# Patient Record
Sex: Female | Born: 1950
Health system: Southern US, Community
[De-identification: ages and names within clinical notes are randomized; demographics above are authoritative.]

## PROBLEM LIST (undated history)

## (undated) DIAGNOSIS — F329 Major depressive disorder, single episode, unspecified: Secondary | ICD-10-CM

## (undated) DIAGNOSIS — E785 Hyperlipidemia, unspecified: Secondary | ICD-10-CM

## (undated) DIAGNOSIS — M1990 Unspecified osteoarthritis, unspecified site: Secondary | ICD-10-CM

## (undated) DIAGNOSIS — E039 Hypothyroidism, unspecified: Secondary | ICD-10-CM

## (undated) DIAGNOSIS — G35 Multiple sclerosis: Secondary | ICD-10-CM

## (undated) DIAGNOSIS — G2581 Restless legs syndrome: Secondary | ICD-10-CM

## (undated) DIAGNOSIS — R011 Cardiac murmur, unspecified: Secondary | ICD-10-CM

## (undated) DIAGNOSIS — K5090 Crohn's disease, unspecified, without complications: Secondary | ICD-10-CM

## (undated) DIAGNOSIS — F32A Depression, unspecified: Secondary | ICD-10-CM

## (undated) DIAGNOSIS — R251 Tremor, unspecified: Secondary | ICD-10-CM

## (undated) DIAGNOSIS — K219 Gastro-esophageal reflux disease without esophagitis: Secondary | ICD-10-CM

## (undated) DIAGNOSIS — H3530 Unspecified macular degeneration: Secondary | ICD-10-CM

## (undated) DIAGNOSIS — R569 Unspecified convulsions: Secondary | ICD-10-CM

## (undated) DIAGNOSIS — G35D Multiple sclerosis, unspecified: Secondary | ICD-10-CM

## (undated) DIAGNOSIS — R519 Headache, unspecified: Secondary | ICD-10-CM

## (undated) HISTORY — DX: Gastro-esophageal reflux disease without esophagitis: K21.9

## (undated) HISTORY — DX: Hyperlipidemia, unspecified: E78.5

## (undated) HISTORY — PX: VAGINAL HYSTERECTOMY: SUR661

## (undated) HISTORY — DX: Restless legs syndrome: G25.81

## (undated) HISTORY — PX: EYE SURGERY: SHX253

## (undated) HISTORY — PX: FOOT SURGERY: SHX648

## (undated) HISTORY — PX: NASAL SINUS SURGERY: SHX719

## (undated) HISTORY — PX: TONSILLECTOMY: SUR1361

## (undated) HISTORY — PX: TUBAL LIGATION: SHX77

## (undated) HISTORY — DX: Unspecified macular degeneration: H35.30

---

## 2004-05-27 ENCOUNTER — Ambulatory Visit: Payer: Self-pay | Admitting: Family Medicine

## 2004-09-27 ENCOUNTER — Ambulatory Visit: Payer: Self-pay | Admitting: Family Medicine

## 2005-04-21 ENCOUNTER — Ambulatory Visit: Payer: Self-pay | Admitting: Family Medicine

## 2005-06-06 ENCOUNTER — Ambulatory Visit: Payer: Self-pay | Admitting: Psychiatry

## 2005-06-13 ENCOUNTER — Ambulatory Visit: Payer: Self-pay | Admitting: Psychiatry

## 2005-11-21 ENCOUNTER — Ambulatory Visit: Payer: Self-pay | Admitting: Family Medicine

## 2006-03-20 ENCOUNTER — Ambulatory Visit: Payer: Self-pay | Admitting: Psychiatry

## 2006-11-27 ENCOUNTER — Ambulatory Visit: Payer: Self-pay | Admitting: Family Medicine

## 2008-02-01 ENCOUNTER — Ambulatory Visit: Payer: Self-pay | Admitting: Gastroenterology

## 2008-07-04 ENCOUNTER — Ambulatory Visit: Payer: Self-pay | Admitting: Family Medicine

## 2009-02-19 ENCOUNTER — Ambulatory Visit: Payer: Self-pay | Admitting: Gastroenterology

## 2009-06-14 ENCOUNTER — Ambulatory Visit: Payer: Self-pay | Admitting: Neurology

## 2009-07-23 ENCOUNTER — Ambulatory Visit: Payer: Self-pay | Admitting: Family Medicine

## 2009-11-28 ENCOUNTER — Encounter: Admission: RE | Admit: 2009-11-28 | Discharge: 2009-11-28 | Payer: Self-pay | Admitting: Diagnostic Neuroimaging

## 2009-11-29 ENCOUNTER — Encounter: Admission: RE | Admit: 2009-11-29 | Discharge: 2010-02-13 | Payer: Self-pay | Admitting: Diagnostic Neuroimaging

## 2011-11-27 ENCOUNTER — Ambulatory Visit: Payer: Self-pay | Admitting: Family Medicine

## 2012-01-27 LAB — HM PAP SMEAR: HM Pap smear: NORMAL

## 2012-01-29 ENCOUNTER — Ambulatory Visit: Payer: Self-pay | Admitting: Family Medicine

## 2012-05-19 HISTORY — PX: COLONOSCOPY: SHX174

## 2013-01-17 LAB — HM COLONOSCOPY: HM COLON: NORMAL

## 2013-01-28 ENCOUNTER — Ambulatory Visit: Payer: Self-pay | Admitting: Gastroenterology

## 2013-05-31 DIAGNOSIS — IMO0002 Reserved for concepts with insufficient information to code with codable children: Secondary | ICD-10-CM | POA: Diagnosis not present

## 2013-06-07 DIAGNOSIS — G2581 Restless legs syndrome: Secondary | ICD-10-CM | POA: Diagnosis not present

## 2013-06-07 DIAGNOSIS — E785 Hyperlipidemia, unspecified: Secondary | ICD-10-CM | POA: Diagnosis not present

## 2013-06-07 DIAGNOSIS — I43 Cardiomyopathy in diseases classified elsewhere: Secondary | ICD-10-CM | POA: Diagnosis not present

## 2013-06-07 DIAGNOSIS — E039 Hypothyroidism, unspecified: Secondary | ICD-10-CM | POA: Diagnosis not present

## 2013-06-29 DIAGNOSIS — H3530 Unspecified macular degeneration: Secondary | ICD-10-CM | POA: Diagnosis not present

## 2013-08-01 DIAGNOSIS — K5190 Ulcerative colitis, unspecified, without complications: Secondary | ICD-10-CM | POA: Diagnosis not present

## 2013-08-01 DIAGNOSIS — Z800 Family history of malignant neoplasm of digestive organs: Secondary | ICD-10-CM | POA: Diagnosis not present

## 2013-08-01 DIAGNOSIS — G35 Multiple sclerosis: Secondary | ICD-10-CM | POA: Diagnosis not present

## 2013-08-25 DIAGNOSIS — IMO0002 Reserved for concepts with insufficient information to code with codable children: Secondary | ICD-10-CM | POA: Diagnosis not present

## 2013-10-03 DIAGNOSIS — R1084 Generalized abdominal pain: Secondary | ICD-10-CM | POA: Diagnosis not present

## 2013-10-03 DIAGNOSIS — Z8719 Personal history of other diseases of the digestive system: Secondary | ICD-10-CM | POA: Diagnosis not present

## 2013-10-05 DIAGNOSIS — R109 Unspecified abdominal pain: Secondary | ICD-10-CM | POA: Diagnosis not present

## 2013-10-05 DIAGNOSIS — E039 Hypothyroidism, unspecified: Secondary | ICD-10-CM | POA: Insufficient documentation

## 2013-10-05 DIAGNOSIS — R197 Diarrhea, unspecified: Secondary | ICD-10-CM | POA: Diagnosis not present

## 2013-10-05 DIAGNOSIS — K52831 Collagenous colitis: Secondary | ICD-10-CM | POA: Insufficient documentation

## 2013-10-05 DIAGNOSIS — K5190 Ulcerative colitis, unspecified, without complications: Secondary | ICD-10-CM | POA: Diagnosis not present

## 2013-10-06 DIAGNOSIS — K5190 Ulcerative colitis, unspecified, without complications: Secondary | ICD-10-CM | POA: Diagnosis not present

## 2013-10-06 DIAGNOSIS — R197 Diarrhea, unspecified: Secondary | ICD-10-CM | POA: Diagnosis not present

## 2013-11-16 DIAGNOSIS — IMO0002 Reserved for concepts with insufficient information to code with codable children: Secondary | ICD-10-CM | POA: Diagnosis not present

## 2013-11-24 DIAGNOSIS — Z0000 Encounter for general adult medical examination without abnormal findings: Secondary | ICD-10-CM | POA: Diagnosis not present

## 2013-11-24 DIAGNOSIS — I43 Cardiomyopathy in diseases classified elsewhere: Secondary | ICD-10-CM | POA: Diagnosis not present

## 2013-11-24 DIAGNOSIS — E785 Hyperlipidemia, unspecified: Secondary | ICD-10-CM | POA: Diagnosis not present

## 2013-11-24 DIAGNOSIS — E039 Hypothyroidism, unspecified: Secondary | ICD-10-CM | POA: Diagnosis not present

## 2013-11-24 LAB — HEPATIC FUNCTION PANEL
ALT: 5 U/L — AB (ref 7–35)
AST: 11 U/L — AB (ref 13–35)

## 2013-12-06 ENCOUNTER — Ambulatory Visit: Payer: Self-pay | Admitting: Family Medicine

## 2013-12-06 DIAGNOSIS — Z1231 Encounter for screening mammogram for malignant neoplasm of breast: Secondary | ICD-10-CM | POA: Diagnosis not present

## 2013-12-06 LAB — HM MAMMOGRAPHY: HM Mammogram: NORMAL

## 2013-12-27 DIAGNOSIS — K5190 Ulcerative colitis, unspecified, without complications: Secondary | ICD-10-CM | POA: Diagnosis not present

## 2013-12-27 DIAGNOSIS — G35 Multiple sclerosis: Secondary | ICD-10-CM | POA: Diagnosis not present

## 2013-12-27 DIAGNOSIS — R198 Other specified symptoms and signs involving the digestive system and abdomen: Secondary | ICD-10-CM | POA: Diagnosis not present

## 2014-01-03 DIAGNOSIS — H353190 Nonexudative age-related macular degeneration, unspecified eye, stage unspecified: Secondary | ICD-10-CM | POA: Diagnosis not present

## 2014-02-07 DIAGNOSIS — IMO0002 Reserved for concepts with insufficient information to code with codable children: Secondary | ICD-10-CM | POA: Diagnosis not present

## 2014-03-28 DIAGNOSIS — K529 Noninfective gastroenteritis and colitis, unspecified: Secondary | ICD-10-CM | POA: Diagnosis not present

## 2014-03-28 DIAGNOSIS — E039 Hypothyroidism, unspecified: Secondary | ICD-10-CM | POA: Diagnosis not present

## 2014-03-28 DIAGNOSIS — K5130 Ulcerative (chronic) rectosigmoiditis without complications: Secondary | ICD-10-CM | POA: Diagnosis not present

## 2014-03-28 DIAGNOSIS — R140 Abdominal distension (gaseous): Secondary | ICD-10-CM | POA: Diagnosis not present

## 2014-04-27 DIAGNOSIS — R2689 Other abnormalities of gait and mobility: Secondary | ICD-10-CM | POA: Diagnosis not present

## 2014-04-27 DIAGNOSIS — M545 Low back pain: Secondary | ICD-10-CM | POA: Diagnosis not present

## 2014-04-27 DIAGNOSIS — R4189 Other symptoms and signs involving cognitive functions and awareness: Secondary | ICD-10-CM | POA: Diagnosis not present

## 2014-05-05 DIAGNOSIS — F330 Major depressive disorder, recurrent, mild: Secondary | ICD-10-CM | POA: Diagnosis not present

## 2014-05-25 DIAGNOSIS — Z23 Encounter for immunization: Secondary | ICD-10-CM | POA: Diagnosis not present

## 2014-05-25 DIAGNOSIS — K219 Gastro-esophageal reflux disease without esophagitis: Secondary | ICD-10-CM | POA: Diagnosis not present

## 2014-05-25 DIAGNOSIS — E784 Other hyperlipidemia: Secondary | ICD-10-CM | POA: Diagnosis not present

## 2014-05-25 DIAGNOSIS — E559 Vitamin D deficiency, unspecified: Secondary | ICD-10-CM | POA: Diagnosis not present

## 2014-05-25 DIAGNOSIS — I43 Cardiomyopathy in diseases classified elsewhere: Secondary | ICD-10-CM | POA: Diagnosis not present

## 2014-05-25 DIAGNOSIS — E039 Hypothyroidism, unspecified: Secondary | ICD-10-CM | POA: Diagnosis not present

## 2014-05-25 DIAGNOSIS — F17200 Nicotine dependence, unspecified, uncomplicated: Secondary | ICD-10-CM | POA: Diagnosis not present

## 2014-05-25 DIAGNOSIS — G2581 Restless legs syndrome: Secondary | ICD-10-CM | POA: Diagnosis not present

## 2014-05-25 LAB — LIPID PANEL
Cholesterol: 189 mg/dL (ref 0–200)
HDL: 44 mg/dL (ref 35–70)
LDL Cholesterol: 122 mg/dL
TRIGLYCERIDES: 116 mg/dL (ref 40–160)

## 2014-05-25 LAB — BASIC METABOLIC PANEL
BUN: 4 mg/dL (ref 4–21)
CREATININE: 0.9 mg/dL (ref <=1.1)
Glucose: 88 mg/dL

## 2014-05-25 LAB — TSH: TSH: 1.95 u[IU]/mL (ref <=5.90)

## 2014-05-31 DIAGNOSIS — K5190 Ulcerative colitis, unspecified, without complications: Secondary | ICD-10-CM | POA: Diagnosis not present

## 2014-06-06 DIAGNOSIS — F330 Major depressive disorder, recurrent, mild: Secondary | ICD-10-CM | POA: Diagnosis not present

## 2014-06-26 DIAGNOSIS — G35 Multiple sclerosis: Secondary | ICD-10-CM | POA: Diagnosis not present

## 2014-06-27 ENCOUNTER — Ambulatory Visit: Payer: Self-pay | Admitting: Family Medicine

## 2014-06-27 DIAGNOSIS — Z780 Asymptomatic menopausal state: Secondary | ICD-10-CM | POA: Diagnosis not present

## 2014-06-27 DIAGNOSIS — M810 Age-related osteoporosis without current pathological fracture: Secondary | ICD-10-CM | POA: Diagnosis not present

## 2014-06-27 LAB — HM DEXA SCAN: HM DEXA SCAN: NORMAL

## 2014-07-10 DIAGNOSIS — G35 Multiple sclerosis: Secondary | ICD-10-CM | POA: Diagnosis not present

## 2014-08-29 DIAGNOSIS — F330 Major depressive disorder, recurrent, mild: Secondary | ICD-10-CM | POA: Diagnosis not present

## 2014-09-29 DIAGNOSIS — F32A Depression, unspecified: Secondary | ICD-10-CM | POA: Insufficient documentation

## 2014-09-29 DIAGNOSIS — G35 Multiple sclerosis: Secondary | ICD-10-CM | POA: Insufficient documentation

## 2014-09-29 DIAGNOSIS — E7849 Other hyperlipidemia: Secondary | ICD-10-CM | POA: Insufficient documentation

## 2014-09-29 DIAGNOSIS — F17200 Nicotine dependence, unspecified, uncomplicated: Secondary | ICD-10-CM | POA: Insufficient documentation

## 2014-09-29 DIAGNOSIS — G35D Multiple sclerosis, unspecified: Secondary | ICD-10-CM | POA: Insufficient documentation

## 2014-09-29 DIAGNOSIS — Z0000 Encounter for general adult medical examination without abnormal findings: Secondary | ICD-10-CM | POA: Insufficient documentation

## 2014-09-29 DIAGNOSIS — G2581 Restless legs syndrome: Secondary | ICD-10-CM | POA: Insufficient documentation

## 2014-09-29 DIAGNOSIS — I519 Heart disease, unspecified: Secondary | ICD-10-CM | POA: Insufficient documentation

## 2014-09-29 DIAGNOSIS — M810 Age-related osteoporosis without current pathological fracture: Secondary | ICD-10-CM | POA: Insufficient documentation

## 2014-09-29 DIAGNOSIS — E039 Hypothyroidism, unspecified: Secondary | ICD-10-CM | POA: Insufficient documentation

## 2014-09-29 DIAGNOSIS — F329 Major depressive disorder, single episode, unspecified: Secondary | ICD-10-CM | POA: Insufficient documentation

## 2014-09-29 DIAGNOSIS — K219 Gastro-esophageal reflux disease without esophagitis: Secondary | ICD-10-CM | POA: Insufficient documentation

## 2014-10-04 DIAGNOSIS — H2513 Age-related nuclear cataract, bilateral: Secondary | ICD-10-CM | POA: Diagnosis not present

## 2014-10-11 DIAGNOSIS — H532 Diplopia: Secondary | ICD-10-CM | POA: Diagnosis not present

## 2014-10-18 ENCOUNTER — Inpatient Hospital Stay: Admission: RE | Admit: 2014-10-18 | Payer: Self-pay | Source: Ambulatory Visit

## 2014-10-18 DIAGNOSIS — H2513 Age-related nuclear cataract, bilateral: Secondary | ICD-10-CM | POA: Diagnosis not present

## 2014-10-24 ENCOUNTER — Encounter: Payer: Self-pay | Admitting: *Deleted

## 2014-10-24 DIAGNOSIS — M810 Age-related osteoporosis without current pathological fracture: Secondary | ICD-10-CM | POA: Diagnosis not present

## 2014-10-24 DIAGNOSIS — G35 Multiple sclerosis: Secondary | ICD-10-CM | POA: Diagnosis not present

## 2014-10-24 DIAGNOSIS — Z885 Allergy status to narcotic agent status: Secondary | ICD-10-CM | POA: Diagnosis not present

## 2014-10-24 DIAGNOSIS — M545 Low back pain: Secondary | ICD-10-CM | POA: Diagnosis not present

## 2014-10-24 DIAGNOSIS — R251 Tremor, unspecified: Secondary | ICD-10-CM | POA: Diagnosis not present

## 2014-10-24 DIAGNOSIS — R05 Cough: Secondary | ICD-10-CM | POA: Diagnosis not present

## 2014-10-24 DIAGNOSIS — R270 Ataxia, unspecified: Secondary | ICD-10-CM | POA: Diagnosis not present

## 2014-10-24 DIAGNOSIS — R4189 Other symptoms and signs involving cognitive functions and awareness: Secondary | ICD-10-CM | POA: Diagnosis not present

## 2014-10-24 DIAGNOSIS — R2689 Other abnormalities of gait and mobility: Secondary | ICD-10-CM | POA: Diagnosis not present

## 2014-10-24 DIAGNOSIS — H2511 Age-related nuclear cataract, right eye: Secondary | ICD-10-CM | POA: Diagnosis not present

## 2014-10-24 DIAGNOSIS — Z90710 Acquired absence of both cervix and uterus: Secondary | ICD-10-CM | POA: Diagnosis not present

## 2014-10-24 DIAGNOSIS — F329 Major depressive disorder, single episode, unspecified: Secondary | ICD-10-CM | POA: Diagnosis not present

## 2014-10-24 DIAGNOSIS — R569 Unspecified convulsions: Secondary | ICD-10-CM | POA: Diagnosis not present

## 2014-10-24 DIAGNOSIS — E079 Disorder of thyroid, unspecified: Secondary | ICD-10-CM | POA: Diagnosis not present

## 2014-10-26 DIAGNOSIS — K5190 Ulcerative colitis, unspecified, without complications: Secondary | ICD-10-CM | POA: Diagnosis not present

## 2014-10-30 ENCOUNTER — Encounter
Admission: RE | Disposition: A | Discharge status: Home / Self Care | Payer: Self-pay | Source: Ambulatory Visit | Attending: Ophthalmology

## 2014-10-30 ENCOUNTER — Encounter: Payer: Self-pay | Admitting: Ophthalmology

## 2014-10-30 ENCOUNTER — Ambulatory Visit: Payer: Medicare Other | Admitting: *Deleted

## 2014-10-30 ENCOUNTER — Ambulatory Visit
Admission: RE | Admit: 2014-10-30 | Discharge: 2014-10-30 | Disposition: A | Discharge status: Home / Self Care | Payer: Medicare Other | Source: Ambulatory Visit | Attending: Ophthalmology | Admitting: Ophthalmology

## 2014-10-30 DIAGNOSIS — Z90710 Acquired absence of both cervix and uterus: Secondary | ICD-10-CM | POA: Insufficient documentation

## 2014-10-30 DIAGNOSIS — R251 Tremor, unspecified: Secondary | ICD-10-CM | POA: Insufficient documentation

## 2014-10-30 DIAGNOSIS — M810 Age-related osteoporosis without current pathological fracture: Secondary | ICD-10-CM | POA: Insufficient documentation

## 2014-10-30 DIAGNOSIS — F329 Major depressive disorder, single episode, unspecified: Secondary | ICD-10-CM | POA: Diagnosis not present

## 2014-10-30 DIAGNOSIS — G35 Multiple sclerosis: Secondary | ICD-10-CM | POA: Diagnosis not present

## 2014-10-30 DIAGNOSIS — Z885 Allergy status to narcotic agent status: Secondary | ICD-10-CM | POA: Insufficient documentation

## 2014-10-30 DIAGNOSIS — H2511 Age-related nuclear cataract, right eye: Secondary | ICD-10-CM | POA: Diagnosis not present

## 2014-10-30 DIAGNOSIS — R05 Cough: Secondary | ICD-10-CM | POA: Insufficient documentation

## 2014-10-30 DIAGNOSIS — H2513 Age-related nuclear cataract, bilateral: Secondary | ICD-10-CM | POA: Diagnosis not present

## 2014-10-30 DIAGNOSIS — E079 Disorder of thyroid, unspecified: Secondary | ICD-10-CM | POA: Insufficient documentation

## 2014-10-30 DIAGNOSIS — E039 Hypothyroidism, unspecified: Secondary | ICD-10-CM | POA: Diagnosis not present

## 2014-10-30 DIAGNOSIS — R569 Unspecified convulsions: Secondary | ICD-10-CM | POA: Insufficient documentation

## 2014-10-30 HISTORY — DX: Unspecified convulsions: R56.9

## 2014-10-30 HISTORY — DX: Hypothyroidism, unspecified: E03.9

## 2014-10-30 HISTORY — DX: Multiple sclerosis: G35

## 2014-10-30 HISTORY — DX: Major depressive disorder, single episode, unspecified: F32.9

## 2014-10-30 HISTORY — DX: Tremor, unspecified: R25.1

## 2014-10-30 HISTORY — DX: Depression, unspecified: F32.A

## 2014-10-30 HISTORY — DX: Cardiac murmur, unspecified: R01.1

## 2014-10-30 HISTORY — PX: CATARACT EXTRACTION W/PHACO: SHX586

## 2014-10-30 HISTORY — DX: Multiple sclerosis, unspecified: G35.D

## 2014-10-30 SURGERY — PHACOEMULSIFICATION, CATARACT, WITH IOL INSERTION
Anesthesia: Monitor Anesthesia Care | Site: Eye | Laterality: Right | Wound class: Clean

## 2014-10-30 MED ORDER — TETRACAINE HCL 0.5 % OP SOLN
OPHTHALMIC | Status: AC
  Filled 2014-10-30: qty 2

## 2014-10-30 MED ORDER — CEFUROXIME OPHTHALMIC INJECTION 1 MG/0.1 ML
INJECTION | OPHTHALMIC | Status: DC | PRN
  Administered 2014-10-30: 0.1 mL via INTRACAMERAL

## 2014-10-30 MED ORDER — PROPARACAINE HCL 0.5 % OP SOLN
OPHTHALMIC | Status: DC | PRN
  Administered 2014-10-30: 1 [drp] via OPHTHALMIC

## 2014-10-30 MED ORDER — EPINEPHRINE HCL 1 MG/ML IJ SOLN
INTRAMUSCULAR | Status: AC
  Filled 2014-10-30: qty 1

## 2014-10-30 MED ORDER — NA CHONDROIT SULF-NA HYALURON 40-17 MG/ML IO SOLN
INTRAOCULAR | Status: DC | PRN
  Administered 2014-10-30: 1 mL via INTRAOCULAR

## 2014-10-30 MED ORDER — EPINEPHRINE HCL 1 MG/ML IJ SOLN
INTRAOCULAR | Status: DC | PRN
  Administered 2014-10-30: 200 mL

## 2014-10-30 MED ORDER — LIDOCAINE HCL (PF) 4 % IJ SOLN
INTRAMUSCULAR | Status: DC | PRN
  Administered 2014-10-30: 5 mL

## 2014-10-30 MED ORDER — TETRACAINE HCL 0.5 % OP SOLN
OPHTHALMIC | Status: DC | PRN
  Administered 2014-10-30: 1 [drp]

## 2014-10-30 MED ORDER — PHENYLEPHRINE HCL 10 % OP SOLN
1.0000 [drp] | OPHTHALMIC | Status: AC | PRN
  Administered 2014-10-30 (×4): 1 [drp] via OPHTHALMIC

## 2014-10-30 MED ORDER — SODIUM CHLORIDE 0.9 % IV SOLN
INTRAVENOUS | Status: DC
  Administered 2014-10-30: 09:00:00 via INTRAVENOUS

## 2014-10-30 MED ORDER — CYCLOPENTOLATE HCL 2 % OP SOLN
1.0000 [drp] | OPHTHALMIC | Status: DC | PRN
  Administered 2014-10-30 (×4): 1 [drp] via OPHTHALMIC

## 2014-10-30 MED ORDER — CYCLOPENTOLATE HCL 2 % OP SOLN
OPHTHALMIC | Status: AC
  Administered 2014-10-30: 1 [drp] via OPHTHALMIC
  Filled 2014-10-30: qty 2

## 2014-10-30 MED ORDER — LIDOCAINE HCL (PF) 4 % IJ SOLN
INTRAMUSCULAR | Status: AC
  Filled 2014-10-30: qty 5

## 2014-10-30 MED ORDER — ALFENTANIL 500 MCG/ML IJ INJ
INJECTION | INTRAMUSCULAR | Status: DC | PRN
  Administered 2014-10-30: 500 ug via INTRAVENOUS

## 2014-10-30 MED ORDER — NA CHONDROIT SULF-NA HYALURON 40-17 MG/ML IO SOLN
INTRAOCULAR | Status: AC
  Filled 2014-10-30: qty 1

## 2014-10-30 MED ORDER — MOXIFLOXACIN HCL 0.5 % OP SOLN
OPHTHALMIC | Status: AC
  Administered 2014-10-30: 1 [drp] via OPHTHALMIC
  Filled 2014-10-30: qty 3

## 2014-10-30 MED ORDER — PROPARACAINE HCL 0.5 % OP SOLN
OPHTHALMIC | Status: AC
  Filled 2014-10-30: qty 15

## 2014-10-30 MED ORDER — LIDOCAINE HCL (PF) 1 % IJ SOLN
INTRAOCULAR | Status: DC | PRN
  Administered 2014-10-30: .5 mL

## 2014-10-30 MED ORDER — MOXIFLOXACIN HCL 0.5 % OP SOLN
1.0000 [drp] | OPHTHALMIC | Status: AC | PRN
  Administered 2014-10-30 (×3): 1 [drp] via OPHTHALMIC

## 2014-10-30 MED ORDER — MIDAZOLAM HCL 2 MG/2ML IJ SOLN
INTRAMUSCULAR | Status: DC | PRN
  Administered 2014-10-30: 1 mg via INTRAVENOUS

## 2014-10-30 MED ORDER — BUPIVACAINE HCL (PF) 0.75 % IJ SOLN
INTRAMUSCULAR | Status: AC
  Filled 2014-10-30: qty 10

## 2014-10-30 MED ORDER — PHENYLEPHRINE HCL 10 % OP SOLN
OPHTHALMIC | Status: AC
  Administered 2014-10-30: 1 [drp] via OPHTHALMIC
  Filled 2014-10-30: qty 5

## 2014-10-30 MED ORDER — CARBACHOL 0.01 % IO SOLN
INTRAOCULAR | Status: DC | PRN
  Administered 2014-10-30: 0.5 mL via INTRAOCULAR

## 2014-10-30 MED ORDER — MOXIFLOXACIN HCL 0.5 % OP SOLN - NO CHARGE
OPHTHALMIC | Status: DC | PRN
  Administered 2014-10-30: 1 [drp]

## 2014-10-30 MED ORDER — HYALURONIDASE HUMAN 150 UNIT/ML IJ SOLN
INTRAMUSCULAR | Status: AC
  Filled 2014-10-30: qty 1

## 2014-10-30 SURGICAL SUPPLY — 28 items
ACTIVE FMS ×3 IMPLANT
CORD BIP STRL DISP 12FT (MISCELLANEOUS) ×3 IMPLANT
DRAPE XRAY CASSETTE 23X24 (DRAPES) ×3 IMPLANT
ERASER HMR WETFIELD 18G (MISCELLANEOUS) ×3 IMPLANT
GLOVE BIO SURGEON STRL SZ8 (GLOVE) ×3 IMPLANT
GLOVE SURG LX 6.5 MICRO (GLOVE) ×2
GLOVE SURG LX 8.0 MICRO (GLOVE) ×2
GLOVE SURG LX STRL 6.5 MICRO (GLOVE) ×1 IMPLANT
GLOVE SURG LX STRL 8.0 MICRO (GLOVE) ×1 IMPLANT
GOWN STRL REUS W/ TWL LRG LVL3 (GOWN DISPOSABLE) ×1 IMPLANT
GOWN STRL REUS W/ TWL XL LVL3 (GOWN DISPOSABLE) ×1 IMPLANT
GOWN STRL REUS W/TWL LRG LVL3 (GOWN DISPOSABLE) ×2
GOWN STRL REUS W/TWL XL LVL3 (GOWN DISPOSABLE) ×2
LENS IOL ACRSF IQ TRC 4 22.5 (Intraocular Lens) ×1 IMPLANT
LENS IOL ACRYSOF IQ TORIC 22.5 (Intraocular Lens) ×2 IMPLANT
LENS IOL IQ TORIC 4 22.5 (Intraocular Lens) ×1 IMPLANT
PACK CATARACT (MISCELLANEOUS) ×3 IMPLANT
PACK CATARACT DINGLEDEIN LX (MISCELLANEOUS) ×3 IMPLANT
PACK EYE AFTER SURG (MISCELLANEOUS) ×3 IMPLANT
SHLD EYE VISITEC  UNIV (MISCELLANEOUS) ×3 IMPLANT
SOL PREP PVP 2OZ (MISCELLANEOUS) ×3
SOLUTION PREP PVP 2OZ (MISCELLANEOUS) ×1 IMPLANT
SUT SILK 5-0 (SUTURE) ×3 IMPLANT
SYR 5ML LL (SYRINGE) ×3 IMPLANT
SYR TB 1ML 27GX1/2 LL (SYRINGE) ×3 IMPLANT
WATER STERILE IRR 1000ML POUR (IV SOLUTION) ×3 IMPLANT
WIPE NON LINTING 3.25X3.25 (MISCELLANEOUS) ×3 IMPLANT
acrysof toric lens ×3 IMPLANT

## 2014-10-30 NOTE — Transfer of Care (Signed)
Immediate Anesthesia Transfer of Care Note  Patient: Brooke Juarez  Procedure(s) Performed: Procedure(s) with comments: CATARACT EXTRACTION PHACO AND INTRAOCULAR LENS PLACEMENT (IOC) (Right) - [place]  1:55.7 AP%   25.1 CDE  45.18  Patient Location: PACU  Anesthesia Type:MAC  Level of Consciousness: awake, alert  and oriented  Airway & Oxygen Therapy: Patient Spontanous Breathing  Post-op Assessment: Report given to RN and Post -op Vital signs reviewed and stable  Post vital signs: Reviewed and stable  Last Vitals:  Filed Vitals:   10/30/14 1019  BP: 133/75  Pulse:   Temp: 36.3 C  Resp: 16    Complications: No apparent anesthesia complications

## 2014-10-30 NOTE — Op Note (Signed)
Date of Surgery: 10/30/2014 Date of Dictation: 10/30/2014 10:15 AM Pre-operative Diagnosis:Nuclear Sclerotic Cataract right Eye Post-operative Diagnosis: same Procedure performed: Extra-capsular Cataract Extraction (ECCE) with placement of a posterior chamber intraocular lens (IOL) right Eye IOL:  Implant Name Type Inv. Item Serial No. Manufacturer Lot No. LRB No. Used  acrysof toric lens     [id number]     Right 1   AcrysofToric T4 Anesthesia: 2% Lidocaine and 4% Marcaine in a 50/50 mixture with 10 unites/ml of Hylenex given as a peribulbar Anesthesiologist: Anesthesiologist: Elyse Hsu, MD CRNA: Bernardo Heater, CRNA Complications: none Estimated Blood Loss: less than 1 ml  Description of procedure:  The patient sat upright and the eyes were anesthetized with topical proparacaine. With the patient fixing at a distant target the 3 o'clock and 9 o'clock positions at the limbus were marked with an sterile indelible surgical marking pen.  The patient was given anesthesia and sedation via intravenous access. The patient was then prepped and draped in the usual fashion. A 25-gauge needle was bent to use for starting the capsulorhexis. A 5-0 silk suture was placed through the conjunctiva superior and inferiorly to serve as bridle sutures.   The [place] system was engaged and registration was performed. The positions of the incisions and the steep axis of the astigmatism were identified by the [place] system and marked with an indelible pen. The Verion heads up display was turned off.  Hemostasis was obtained at the the position of the main incision using an eraser cautery. A partial thickness groove was made at the at that location with a 64 Beaver blade and this was dissected anteriorly with an Avaya. The anterior chamber was entered at 10 o'clock with a 1.0 mm paracentesis knife and through the lamellar dissection with a 2.6 mm Alcon keratome. DiscoVisc was injected to replace the  aqueous and a continuous tear curvilinear capsulorhexis was performed using a bent 25-gauge needle.  Balance salt on a syringe was used to perform hydro-dissection and phacoemulsification was carried out using a divide and conquer technique. Total ultrasound time was 1:55.7. The average ultrasonic power was 25.1. The CDE was 45.18.  Irrigation/aspiration was used to remove the residual cortex and the capsular bag was inflated with DiscoVisc. The intraocular lens was inserted into the capsular bag using a Monarch Delivery System cartridge. The Verion heads up display was turned on and the lens was rotated so that the marks on the base of the haptics were aligned with the steep axis of astigmatism as identified by the [employer] unit.   Irrigation/aspiration was used to remove the residual DiscoVisc. The I/A hand piece was pressed down on top of the lens to prevent rotation. The wound was inflated with balanced salt and checked for leaks. None were found. The position of the Toric lens was reconfirmed using the [place] unit. Miostat was injected via the paracentesis track and 0.1 ml of cefuroxime containing 1 mg of drug was injected via the paracentesis track. The wound was checked for leaks again and none were found.   The bridal sutures were removed and two drops of Vigamox were placed on the eye. An eye shield was placed to protect the eye and the patient was discharged to the recovery area in good condition.   Brooke Schaper MD @T @

## 2014-10-30 NOTE — Op Note (Signed)
Date of Surgery: 10/30/2014 Date of Dictation: 10/30/2014 10:18 AM Pre-operative Diagnosis:Nuclear Sclerotic Cataract right Eye Post-operative Diagnosis: same Procedure performed: Extra-capsular Cataract Extraction (ECCE) with placement of a posterior chamber intraocular lens (IOL) right Eye IOL:  Implant Name Type Inv. Item Serial No. Manufacturer Lot No. LRB No. Used  acrysof toric lens     [id number]     Right 1   Anesthesia: 2% Lidocaine and 4% Marcaine in a 50/50 mixture with 10 unites/ml of Hylenex given as a peribulbar Anesthesiologist: Anesthesiologist: Elyse Hsu, MD CRNA: Bernardo Heater, CRNA Complications: none Estimated Blood Loss: less than 1 ml  Description of procedure:  The patient sat upright and the eyes were anesthetized with topical proparacaine. With the patient fixing at a distant target the 3 o'clock and 9 o'clock positions at the limbus were marked with an sterile indelible surgical marking pen.  The patient was given anesthesia and sedation via intravenous access. The patient was then prepped and draped in the usual fashion. A 25-gauge needle was bent to use for starting the capsulorhexis. A 5-0 silk suture was placed through the conjunctiva superior and inferiorly to serve as bridle sutures.   The [place] system was engaged and registration was performed. The positions of the incisions and the steep axis of the astigmatism were identified by the [place] system and marked with an indelible pen. The Verion heads up display was turned off.  Hemostasis was obtained at the the position of the main incision using an eraser cautery. A partial thickness groove was made at the at that location with a 64 Beaver blade and this was dissected anteriorly with an Avaya. The anterior chamber was entered at 10 o'clock with a 1.0 mm paracentesis knife and through the lamellar dissection with a 2.6 mm Alcon keratome. DiscoVisc was injected to replace the aqueous and a  continuous tear curvilinear capsulorhexis was performed using a bent 25-gauge needle.  Balance salt on a syringe was used to perform hydro-dissection and phacoemulsification was carried out using a divide and conquer technique. Total ultrasound time was 155.7. The average ultrasonic power was 25.1. The CDE was 45.18.  Irrigation/aspiration was used to remove the residual cortex and the capsular bag was inflated with DiscoVisc. The intraocular lens was inserted into the capsular bag using a Monarch Delivery System cartridge. The Verion heads up display was turned on and the lens was rotated so that the marks on the base of the haptics were aligned with the steep axis of astigmatism as identified by the [place] unit.   Irrigation/aspiration was used to remove the residual DiscoVisc. The I/A hand piece was pressed down on top of the lens to prevent rotation. The wound was inflated with balanced salt and checked for leaks. None were found. The position of the Toric lens was reconfirmed using the [place] unit. Miostat was injected via the paracentesis track and 0.1 ml of cefuroxime containing 1 mg of drug was injected via the paracentesis track. The wound was checked for leaks again and none were found.   The bridal sutures were removed and two drops of Vigamox were placed on the eye. An eye shield was placed to protect the eye and the patient was discharged to the recovery area in good condition.   Ayad Nieman MD @T @

## 2014-10-30 NOTE — Anesthesia Postprocedure Evaluation (Signed)
  Anesthesia Post-op Note  Patient: Brooke Juarez  Procedure(s) Performed: Procedure(s) with comments: CATARACT EXTRACTION PHACO AND INTRAOCULAR LENS PLACEMENT (IOC) (Right) - [place]  1:55.7 AP%   25.1 CDE  45.18  Anesthesia type:MAC  Patient location: PACU  Post pain: Pain level controlled  Post assessment: Post-op Vital signs reviewed, Patient's Cardiovascular Status Stable, Respiratory Function Stable, Patent Airway and No signs of Nausea or vomiting  Post vital signs: Reviewed and stable  Last Vitals:  Filed Vitals:   10/30/14 1019  BP: 133/75  Pulse:   Temp: 36.3 C  Resp: 16    Level of consciousness: awake, alert  and patient cooperative  Complications: No apparent anesthesia complications

## 2014-10-30 NOTE — Anesthesia Preprocedure Evaluation (Signed)
Anesthesia Evaluation  Patient identified by MRN, date of birth, ID band Patient awake    Reviewed: Allergy & Precautions, NPO status , Patient's Chart, lab work & pertinent test results  Airway Mallampati: I  TM Distance: >3 FB Neck ROM: Full    Dental  (+) Partial Upper One tooth is a partial and has been removed--upper.:   Pulmonary Current Smoker,    Pulmonary exam normal       Cardiovascular Exercise Tolerance: Poor Normal cardiovascular exam    Neuro/Psych Long hx of MS--ataxia, poor muscle tone and difficulty walking.    GI/Hepatic GERD-  Medicated and Controlled,  Endo/Other  Hypothyroidism Treated hypothyroidism, stable.  Renal/GU      Musculoskeletal   Abdominal Normal abdominal exam  (+)   Peds  Hematology   Anesthesia Other Findings   Reproductive/Obstetrics                             Anesthesia Physical Anesthesia Plan  ASA: III  Anesthesia Plan: MAC   Post-op Pain Management:    Induction:   Airway Management Planned: Nasal Cannula  Additional Equipment:   Intra-op Plan:   Post-operative Plan:   Informed Consent:   Consent reviewed with POA  Plan Discussed with: CRNA  Anesthesia Plan Comments:         Anesthesia Quick Evaluation

## 2014-10-30 NOTE — H&P (Signed)
  History and physical was faxed and scanned in.

## 2014-10-30 NOTE — Anesthesia Postprocedure Evaluation (Signed)
  Anesthesia Post-op Note  Patient: Brooke Juarez  Procedure(s) Performed: Procedure(s) with comments: CATARACT EXTRACTION PHACO AND INTRAOCULAR LENS PLACEMENT (IOC) (Right) - [place]  1:55.7 AP%   25.1 CDE  45.18  Anesthesia type:MAC  Patient location: PACU  Post pain: Pain level controlled  Post assessment: Post-op Vital signs reviewed, Patient's Cardiovascular Status Stable, Respiratory Function Stable, Patent Airway and No signs of Nausea or vomiting  Post vital signs: Reviewed and stable  Last Vitals:  Filed Vitals:   10/30/14 1027  BP: 129/70  Pulse: 64  Temp:   Resp: 6    Level of consciousness: awake, alert  and patient cooperative  Complications: No apparent anesthesia complications

## 2014-10-30 NOTE — Discharge Instructions (Addendum)
See handoutAMBULATORY SURGERY  DISCHARGE INSTRUCTIONS   1) The drugs that you were given will stay in your system until tomorrow so for the next 24 hours you should not:  A) Drive an automobile B) Make any legal decisions C) Drink any alcoholic beverage   2) You may resume regular meals tomorrow.  Today it is better to start with liquids and gradually work up to solid foods.  You may eat anything you prefer, but it is better to start with liquids, then soup and crackers, and gradually work up to solid foods.   3) Please notify your doctor immediately if you have any unusual bleeding, trouble breathing, redness and pain at the surgery site, drainage, fever, or pain not relieved by medication. 4)   5) Your post-operative visit with Dr.    George Ina                                 is: Date:                        Time:    Please call to schedule your post-operative visit.  6) Additional Instructions:

## 2014-10-30 NOTE — Interval H&P Note (Signed)
History and Physical Interval Note:  10/30/2014 9:29 AM  Brooke Juarez  has presented today for surgery, with the diagnosis of CATARACT  The various methods of treatment have been discussed with the patient and family. After consideration of risks, benefits and other options for treatment, the patient has consented to  Procedure(s): CATARACT EXTRACTION PHACO AND INTRAOCULAR LENS PLACEMENT ([place]) (Right) as a surgical intervention .  The patient's history has been reviewed, patient examined, no change in status, stable for surgery.  I have reviewed the patient's chart and labs.  Questions were answered to the patient's satisfaction.     [name]

## 2014-11-02 ENCOUNTER — Encounter: Payer: Self-pay | Admitting: Ophthalmology

## 2014-11-09 DIAGNOSIS — K5100 Ulcerative (chronic) pancolitis without complications: Secondary | ICD-10-CM | POA: Diagnosis not present

## 2014-11-15 ENCOUNTER — Other Ambulatory Visit: Payer: Self-pay | Admitting: Family Medicine

## 2014-11-15 DIAGNOSIS — E039 Hypothyroidism, unspecified: Secondary | ICD-10-CM

## 2014-11-22 ENCOUNTER — Encounter: Payer: Self-pay | Admitting: Family Medicine

## 2014-11-22 ENCOUNTER — Other Ambulatory Visit: Payer: Self-pay | Admitting: Family Medicine

## 2014-11-22 DIAGNOSIS — F330 Major depressive disorder, recurrent, mild: Secondary | ICD-10-CM | POA: Diagnosis not present

## 2014-11-22 DIAGNOSIS — E559 Vitamin D deficiency, unspecified: Secondary | ICD-10-CM

## 2014-11-22 MED ORDER — VITAMIN D 50 MCG (2000 UT) PO CAPS: 1.0000 | ORAL_CAPSULE | Freq: Every day | ORAL | Status: DC

## 2014-11-27 ENCOUNTER — Encounter: Payer: Self-pay | Admitting: Family Medicine

## 2014-11-27 ENCOUNTER — Other Ambulatory Visit: Payer: Self-pay | Admitting: Family Medicine

## 2014-11-27 ENCOUNTER — Ambulatory Visit (INDEPENDENT_AMBULATORY_CARE_PROVIDER_SITE_OTHER): Payer: Medicare Other | Admitting: Family Medicine

## 2014-11-27 VITALS — BP 98/60 | HR 60 | Ht 62.0 in | Wt 139.0 lb

## 2014-11-27 DIAGNOSIS — F329 Major depressive disorder, single episode, unspecified: Secondary | ICD-10-CM | POA: Diagnosis not present

## 2014-11-27 DIAGNOSIS — Z0000 Encounter for general adult medical examination without abnormal findings: Secondary | ICD-10-CM | POA: Diagnosis not present

## 2014-11-27 DIAGNOSIS — E039 Hypothyroidism, unspecified: Secondary | ICD-10-CM

## 2014-11-27 DIAGNOSIS — K219 Gastro-esophageal reflux disease without esophagitis: Secondary | ICD-10-CM | POA: Diagnosis not present

## 2014-11-27 DIAGNOSIS — I2510 Atherosclerotic heart disease of native coronary artery without angina pectoris: Secondary | ICD-10-CM | POA: Insufficient documentation

## 2014-11-27 DIAGNOSIS — E784 Other hyperlipidemia: Secondary | ICD-10-CM | POA: Diagnosis not present

## 2014-11-27 DIAGNOSIS — Z1231 Encounter for screening mammogram for malignant neoplasm of breast: Secondary | ICD-10-CM

## 2014-11-27 DIAGNOSIS — Z1239 Encounter for other screening for malignant neoplasm of breast: Secondary | ICD-10-CM

## 2014-11-27 DIAGNOSIS — F17210 Nicotine dependence, cigarettes, uncomplicated: Secondary | ICD-10-CM | POA: Diagnosis not present

## 2014-11-27 DIAGNOSIS — M810 Age-related osteoporosis without current pathological fracture: Secondary | ICD-10-CM | POA: Diagnosis not present

## 2014-11-27 DIAGNOSIS — F32A Depression, unspecified: Secondary | ICD-10-CM

## 2014-11-27 DIAGNOSIS — E7849 Other hyperlipidemia: Secondary | ICD-10-CM

## 2014-11-27 NOTE — Progress Notes (Signed)
Patient: Brooke Juarez, Female    DOB: Oct 10, 1950, 64 y.o.   MRN: [id number] Visit Date: 11/27/2014  Today's Provider: Otilio Miu, MD   Chief Complaint  Patient presents with  . Annual Exam    mammo due after 12/07/14    Subjective:    Annual wellness visit Brooke Juarez is a 64 y.o. female who presents today for her Subsequent Annual Wellness Visit. She feels well. She reports exercising rarely due to ms condition. She reports she is sleeping well.   ----------------------------------------------------------- HPI  Review of Systems  Constitutional: Negative for fever, chills, fatigue and unexpected weight change.  HENT: Negative for ear pain, hearing loss, nosebleeds, sneezing, sore throat and trouble swallowing.   Eyes: Negative for photophobia, pain, redness, itching and visual disturbance.  Respiratory: Negative for cough, chest tightness, shortness of breath and wheezing.   Cardiovascular: Negative for chest pain, palpitations and leg swelling.  Gastrointestinal: Negative for nausea, vomiting, abdominal pain, diarrhea, constipation, blood in stool and rectal pain.  Endocrine: Negative for cold intolerance, heat intolerance, polydipsia, polyphagia and polyuria.  Genitourinary: Negative for dysuria, hematuria, flank pain, vaginal bleeding, vaginal discharge, difficulty urinating, menstrual problem and pelvic pain.  Musculoskeletal: Negative for back pain, joint swelling, neck pain and neck stiffness.  Skin: Negative for color change and rash.  Allergic/Immunologic: Negative for immunocompromised state.  Neurological: Negative for dizziness, tremors, seizures, syncope, facial asymmetry, speech difficulty, weakness, light-headedness, numbness and headaches.  Hematological: Does not bruise/bleed easily.  Psychiatric/Behavioral: Negative for suicidal ideas, hallucinations, behavioral problems, confusion, sleep disturbance, self-injury and agitation. The patient is not nervous/anxious.      History   Social History  . Marital Status: Married    Spouse Name: N/A  . Number of Children: N/A  . Years of Education: N/A   Occupational History  . Not on file.   Social History Main Topics  . Smoking status: Current Every Day Smoker  . Smokeless tobacco: Not on file  . Alcohol Use: No  . Drug Use: No  . Sexual Activity: Not Currently   Other Topics Concern  . Not on file   Social History Narrative    Patient Active Problem List   Diagnosis Date Noted  . CAD (coronary artery disease) 11/27/2014  . Medicare annual wellness visit, subsequent 11/27/2014  . Vitamin D deficiency 11/22/2014  . Familial multiple lipoprotein-type hyperlipidemia 09/29/2014  . Clinical depression 09/29/2014  . Acid reflux 09/29/2014  . Routine general medical examination at a health care facility 09/29/2014  . Restless leg 09/29/2014  . DS (disseminated sclerosis) 09/29/2014  . Nicotine addiction 09/29/2014  . OP (osteoporosis) 09/29/2014  . Adult hypothyroidism 10/05/2013  . Colitis gravis 10/05/2013    Past Surgical History  Procedure Laterality Date  . Vaginal hysterectomy    . Nasal sinus surgery    . Tonsillectomy    . Foot surgery    . Tubal ligation    . Cataract extraction w/phaco Right 10/30/2014    Procedure: CATARACT EXTRACTION PHACO AND INTRAOCULAR LENS PLACEMENT (IOC);  Surgeon: Estill Cotta, MD;  Location: [facility] ORS;  Service: Ophthalmology;  Laterality: Right;  [place]  1:55.7   . Colonoscopy  2014    normal- Rein- repeat in 5 years    Her family history includes Cancer in her mother.    Previous Medications   ALENDRONATE (FOSAMAX) 70 MG TABLET    Take 1 tablet by mouth once a week.   ASPIRIN 81 MG TABLET    Take 1 tablet  by mouth daily.   BUPROPION (WELLBUTRIN SR) 150 MG 12 HR TABLET    Take 1 tablet by mouth 2 (two) times daily.   CHOLECALCIFEROL (VITAMIN D) 2000 UNITS CAPS    Take 1 capsule (2,000 Units total) by mouth daily.   EZETIMIBE (ZETIA) 10 MG  TABLET    Take 1 tablet by mouth daily.   GABAPENTIN (NEURONTIN) 600 MG TABLET    Take 2 tablets by mouth 2 (two) times daily. Dr [name]   LEVOTHYROXINE (SYNTHROID, LEVOTHROID) 100 MCG TABLET    TAKE ONE TABLET BY MOUTH ONCE DAILY   MESALAMINE (LIALDA) 1.2 G EC TABLET    Take 1 tablet by mouth 2 (two) times daily. GI   PANTOPRAZOLE (PROTONIX) 40 MG TABLET    Take 1 tablet by mouth daily.   ROPINIROLE (REQUIP) 1 MG TABLET    Take 1 tablet by mouth daily.   SULFASALAZINE (AZULFIDINE) 500 MG EC TABLET    Take 1 tablet by mouth 2 (two) times daily.   TRAZODONE (DESYREL) 100 MG TABLET    Take 1 tablet by mouth daily.    Patient Care Team: Juline Patch, MD as PCP - General (Family Medicine)     Objective:   Vitals: BP 98/60 mmHg  Pulse 60  Ht 5' 2"  (1.575 m)  Wt 139 lb (63.05 kg)  BMI 25.42 kg/m2  LMP  (LMP Unknown)  Physical Exam  Constitutional: She is oriented to person, place, and time. She appears well-developed and well-nourished.  HENT:  Head: Normocephalic.  Right Ear: External ear normal.  Left Ear: External ear normal.  Nose: Nose normal.  Eyes: Conjunctivae and EOM are normal. Pupils are equal, round, and reactive to light.  Neck: Normal range of motion. Neck supple.  Cardiovascular: Normal rate, regular rhythm, normal heart sounds and intact distal pulses.   Pulmonary/Chest: Effort normal and breath sounds normal. Right breast exhibits no inverted nipple, no mass, no nipple discharge, no skin change and no tenderness. Left breast exhibits no inverted nipple, no mass, no nipple discharge, no skin change and no tenderness. Breasts are symmetrical.  Abdominal: Soft. Bowel sounds are normal.  Genitourinary: Vagina normal and uterus normal.  Musculoskeletal: Normal range of motion.  Neurological: She is alert and oriented to person, place, and time. She has normal reflexes.  Skin: Skin is warm and dry.  Psychiatric: She has a normal mood and affect. Her behavior is normal.  Judgment and thought content normal.  Nursing note and vitals reviewed.   Activities of Daily Living No flowsheet data found.  Fall Risk Assessment Fall Risk  11/27/2014  Falls in the past year? Yes  Number falls in past yr: 1  Injury with Fall? No  Risk for fall due to : Impaired balance/gait  Follow up Falls prevention discussed     Patient reports there are not safety devices in place in shower at home.   Depression Screen PHQ 2/9 Scores 11/27/2014  PHQ - 2 Score 0    Cognitive Testing - 6-CIT   Correct? Score   What year is it? yes 0 Yes = 0    No = 4  What month is it? yes 0 Yes = 0    No = 3  Remember:     Pia [address]au, [place], [place]     What time is it? yes 0 Yes = 0    No = 3  Count backwards from 20 to 1 yes 0 Correct =  0    1 error = 2   More than 1 error = 4  Say the months of the year in reverse. yes 0 Correct = 0    1 error = 2   More than 1 error = 4  What address did I ask you to remember? yes 0 Correct = 0  1 error = 2    2 error = 4    3 error = 6    4 error = 8    All wrong = 10       TOTAL SCORE  0/28   Interpretation:  Normal  Normal (0-7) Abnormal (8-28)        Assessment & Plan:     Annual Wellness Visit  Reviewed patient's Family Medical History Reviewed and updated list of patient's medical providers Assessment of cognitive impairment was done Assessed patient's functional ability Established a written schedule for health screening [place] Completed and Reviewed  Exercise Activities and Dietary recommendations Goals    None      Immunization History  Administered Date(s) Administered  . Pneumococcal Conjugate-13 05/25/2014  . Pneumococcal Polysaccharide-23 05/20/2007  . Tdap 05/20/2007    Health Maintenance  Topic Date Due  . ZOSTAVAX  06/02/2008  . INFLUENZA VACCINE  12/18/2014  . PNA vac Low Risk Adult (2 of 2 - PPSV23) 05/26/2015  . MAMMOGRAM  12/07/2015  . TETANUS/TDAP  05/19/2017  .  COLONOSCOPY  01/18/2023  . DEXA SCAN  Completed     Discussed health benefits of physical activity, and encouraged her to engage in regular exercise appropriate for her age and condition.    ------------------------------------------------------------------------------------------------------------   Problem List Items Addressed This Visit      Cardiovascular and Mediastinum   CAD (coronary artery disease) - Primary     Digestive   Acid reflux     Endocrine   Adult hypothyroidism     Musculoskeletal and Integument   OP (osteoporosis)     Other   Familial multiple lipoprotein-type hyperlipidemia   Relevant Orders   Lipid Profile   Clinical depression   Nicotine addiction   Medicare annual wellness visit, subsequent   Relevant Orders   Lipid Profile       Otilio Miu, MD [facility]  11/27/2014

## 2014-11-28 LAB — LIPID PANEL
Chol/HDL Ratio: 3.1 ratio units (ref 0.0–4.4)
Cholesterol, Total: 169 mg/dL (ref 100–199)
HDL: 55 mg/dL (ref >39)
LDL Calculated: 98 mg/dL (ref 0–99)
Triglycerides: 79 mg/dL (ref 0–149)
VLDL Cholesterol Cal: 16 mg/dL (ref 5–40)

## 2014-12-12 ENCOUNTER — Ambulatory Visit
Admission: RE | Admit: 2014-12-12 | Discharge: 2014-12-12 | Disposition: A | Discharge status: Home / Self Care | Payer: Medicare Other | Source: Ambulatory Visit | Attending: Family Medicine | Admitting: Family Medicine

## 2014-12-12 DIAGNOSIS — Z1231 Encounter for screening mammogram for malignant neoplasm of breast: Secondary | ICD-10-CM | POA: Insufficient documentation

## 2014-12-14 ENCOUNTER — Other Ambulatory Visit: Payer: Self-pay | Admitting: Family Medicine

## 2014-12-14 DIAGNOSIS — E039 Hypothyroidism, unspecified: Secondary | ICD-10-CM

## 2014-12-14 DIAGNOSIS — M810 Age-related osteoporosis without current pathological fracture: Secondary | ICD-10-CM

## 2015-03-13 DIAGNOSIS — F330 Major depressive disorder, recurrent, mild: Secondary | ICD-10-CM | POA: Diagnosis not present

## 2015-03-19 ENCOUNTER — Other Ambulatory Visit: Payer: Self-pay | Admitting: Family Medicine

## 2015-04-18 ENCOUNTER — Other Ambulatory Visit: Payer: Self-pay | Admitting: Family Medicine

## 2015-04-23 DIAGNOSIS — K52831 Collagenous colitis: Secondary | ICD-10-CM | POA: Diagnosis not present

## 2015-05-21 ENCOUNTER — Other Ambulatory Visit: Payer: Self-pay | Admitting: Family Medicine

## 2015-05-31 ENCOUNTER — Ambulatory Visit (INDEPENDENT_AMBULATORY_CARE_PROVIDER_SITE_OTHER): Payer: Medicare Other | Admitting: Family Medicine

## 2015-05-31 ENCOUNTER — Encounter: Payer: Self-pay | Admitting: Family Medicine

## 2015-05-31 VITALS — BP 120/80 | HR 78 | Ht 62.0 in | Wt 141.0 lb

## 2015-05-31 DIAGNOSIS — S338XXA Sprain of other parts of lumbar spine and pelvis, initial encounter: Secondary | ICD-10-CM

## 2015-05-31 DIAGNOSIS — M810 Age-related osteoporosis without current pathological fracture: Secondary | ICD-10-CM

## 2015-05-31 DIAGNOSIS — G2581 Restless legs syndrome: Secondary | ICD-10-CM | POA: Diagnosis not present

## 2015-05-31 DIAGNOSIS — E7849 Other hyperlipidemia: Secondary | ICD-10-CM

## 2015-05-31 DIAGNOSIS — E784 Other hyperlipidemia: Secondary | ICD-10-CM

## 2015-05-31 DIAGNOSIS — Z0000 Encounter for general adult medical examination without abnormal findings: Secondary | ICD-10-CM | POA: Diagnosis not present

## 2015-05-31 DIAGNOSIS — E039 Hypothyroidism, unspecified: Secondary | ICD-10-CM | POA: Diagnosis not present

## 2015-05-31 DIAGNOSIS — K219 Gastro-esophageal reflux disease without esophagitis: Secondary | ICD-10-CM | POA: Diagnosis not present

## 2015-05-31 DIAGNOSIS — S39012A Strain of muscle, fascia and tendon of lower back, initial encounter: Secondary | ICD-10-CM

## 2015-05-31 DIAGNOSIS — J0100 Acute maxillary sinusitis, unspecified: Secondary | ICD-10-CM | POA: Diagnosis not present

## 2015-05-31 MED ORDER — EZETIMIBE 10 MG PO TABS: 10.0000 mg | ORAL_TABLET | Freq: Every day | ORAL | Status: DC

## 2015-05-31 MED ORDER — LEVOTHYROXINE SODIUM 100 MCG PO TABS: ORAL_TABLET | ORAL | Status: DC

## 2015-05-31 MED ORDER — AZITHROMYCIN 250 MG PO TABS: ORAL_TABLET | ORAL | Status: DC

## 2015-05-31 MED ORDER — PANTOPRAZOLE SODIUM 40 MG PO TBEC: 40.0000 mg | DELAYED_RELEASE_TABLET | Freq: Every day | ORAL | Status: DC

## 2015-05-31 MED ORDER — ROPINIROLE HCL 1 MG PO TABS: 1.0000 mg | ORAL_TABLET | Freq: Every day | ORAL | Status: DC

## 2015-05-31 MED ORDER — ALENDRONATE SODIUM 70 MG PO TABS: 70.0000 mg | ORAL_TABLET | ORAL | Status: DC

## 2015-05-31 NOTE — Progress Notes (Signed)
Patient: Brooke Juarez, Female    DOB: [birth year], 65 y.o.   MRN: [id number] Visit Date: 05/31/2015  Today's Provider: Otilio Miu, MD   Chief Complaint  Patient presents with  . Annual Exam    medicare annual wellness   Subjective:   Initial preventative physical exam Brooke Juarez is a 65 y.o. female who presents today for her Initial Preventative Physical Exam. She feels well. She reports exercising as able. She reports she is sleeping well.  HPI Comments: Patient has restless leg.  Thyroid Problem Presents for follow-up visit. Patient reports no anxiety, cold intolerance, constipation, depressed mood, diaphoresis, diarrhea, dry skin, fatigue, hair loss, heat intolerance, hoarse voice, leg swelling, menstrual problem, nail problem, palpitations, tremors, visual change, weight gain or weight loss. The symptoms have been stable. Past treatments include levothyroxine. The treatment provided mild relief. There is no history of atrial fibrillation, dementia, diabetes, Graves' ophthalmopathy, heart failure, hyperlipidemia, neuropathy, obesity or osteopenia.  Gastroesophageal Reflux She reports no abdominal pain, no belching, no chest pain, no choking, no coughing, no dysphagia, no early satiety, no globus sensation, no heartburn, no hoarse voice, no nausea, no sore throat, no stridor, no tooth decay, no water brash or no wheezing. This is a chronic problem. The current episode started more than 1 year ago. The problem has been waxing and waning. Pertinent negatives include no fatigue or weight loss. She has tried a PPI for the symptoms.  Hyperlipidemia This is a chronic problem. The current episode started more than 1 year ago. The problem is controlled. Recent lipid tests were reviewed and are normal. She has no history of diabetes. Pertinent negatives include no chest pain or shortness of breath. Current antihyperlipidemic treatment includes ezetimibe. The current treatment provides mild  improvement of lipids. There are no compliance problems.   Sinusitis This is a chronic problem. The current episode started more than 1 year ago. The problem has been waxing and waning since onset. There has been no fever. Associated symptoms include congestion, headaches and sinus pressure. Pertinent negatives include no chills, coughing, diaphoresis, hoarse voice, neck pain, shortness of breath, sore throat or swollen glands. Past treatments include acetaminophen. The treatment provided no relief.  Back Pain This is a chronic problem. The current episode started more than 1 year ago. The problem occurs intermittently (2/ week). The problem has been gradually worsening since onset. The pain is present in the lumbar spine. The pain is at a severity of 4/10. The pain is mild. The symptoms are aggravated by bending. Associated symptoms include headaches. Pertinent negatives include no abdominal pain, bladder incontinence, bowel incontinence, chest pain or weight loss.    Review of Systems  Constitutional: Negative for chills, weight loss, weight gain, diaphoresis and fatigue.  HENT: Positive for congestion and sinus pressure. Negative for hoarse voice and sore throat.   Respiratory: Negative for cough, choking, shortness of breath and wheezing.   Cardiovascular: Negative for chest pain and palpitations.  Gastrointestinal: Negative for heartburn, dysphagia, nausea, abdominal pain, diarrhea, constipation and bowel incontinence.  Endocrine: Negative for cold intolerance and heat intolerance.  Genitourinary: Negative for bladder incontinence and menstrual problem.  Musculoskeletal: Positive for back pain. Negative for neck pain.  Neurological: Positive for headaches. Negative for tremors.  Psychiatric/Behavioral: The patient is not nervous/anxious.     Social History   Social History  . Marital Status: Married    Spouse Name: N/A  . Number of Children: N/A  . Years of Education: N/A  Occupational History  . Not on file.   Social History Main Topics  . Smoking status: Current Every Day Smoker  . Smokeless tobacco: Not on file  . Alcohol Use: No  . Drug Use: No  . Sexual Activity: Not Currently   Other Topics Concern  . Not on file   Social History Narrative    Patient Active Problem List   Diagnosis Date Noted  . CAD (coronary artery disease) 11/27/2014  . Medicare annual wellness visit, subsequent 11/27/2014  . Vitamin D deficiency 11/22/2014  . Familial multiple lipoprotein-type hyperlipidemia 09/29/2014  . Clinical depression 09/29/2014  . Acid reflux 09/29/2014  . Routine general medical examination at a health care facility 09/29/2014  . Restless leg 09/29/2014  . DS (disseminated sclerosis) ([place]) 09/29/2014  . Nicotine addiction 09/29/2014  . OP (osteoporosis) 09/29/2014  . Adult hypothyroidism 10/05/2013  . Colitis gravis ([place]) 10/05/2013    Past Surgical History  Procedure Laterality Date  . Vaginal hysterectomy    . Nasal sinus surgery    . Tonsillectomy    . Foot surgery    . Tubal ligation    . Cataract extraction w/phaco Right 10/30/2014    Procedure: CATARACT EXTRACTION PHACO AND INTRAOCULAR LENS PLACEMENT (IOC);  Surgeon: Estill Cotta, MD;  Location: [facility] ORS;  Service: Ophthalmology;  Laterality: Right;  [place]  1:55.7   . Colonoscopy  2014    normal- Rein- repeat in 5 years    Her family history includes Cancer in her mother.    Previous Medications   ASPIRIN 81 MG TABLET    Take 1 tablet by mouth daily.   BUPROPION (WELLBUTRIN SR) 150 MG 12 HR TABLET    Take 1 tablet by mouth 2 (two) times daily.   CHOLECALCIFEROL (VITAMIN D) 2000 UNITS CAPS    Take 1 capsule (2,000 Units total) by mouth daily.   GABAPENTIN (NEURONTIN) 600 MG TABLET    Take 2 tablets by mouth 2 (two) times daily. Dr [name]   MESALAMINE (LIALDA) 1.2 G EC TABLET    Take 1 tablet by mouth 2 (two) times daily. GI   SULFASALAZINE (AZULFIDINE) 500 MG EC TABLET     Take 1 tablet by mouth 2 (two) times daily.   TRAZODONE (DESYREL) 100 MG TABLET    Take 1 tablet by mouth daily.    Patient Care Team: Juline Patch, MD as PCP - General (Family Medicine)     Objective:   Vitals: BP 120/80 mmHg  Pulse 78  Ht 5' 2"  (1.575 m)  Wt 141 lb (63.957 kg)  BMI 25.78 kg/m2  LMP  (LMP Unknown)  Physical Exam  Constitutional: She is oriented to person, place, and time. She appears well-developed and well-nourished.  HENT:  Head: Normocephalic.  Right Ear: External ear normal.  Left Ear: External ear normal.  Nose: Nose normal.  Eyes: Conjunctivae and EOM are normal. Pupils are equal, round, and reactive to light.  Neck: Normal range of motion. Neck supple.  Cardiovascular: Normal rate, regular rhythm, normal heart sounds and intact distal pulses.   Pulmonary/Chest: Effort normal and breath sounds normal.  Abdominal: Soft. Bowel sounds are normal.  Genitourinary: Vagina normal and uterus normal.  Musculoskeletal: Normal range of motion.  Neurological: She is alert and oriented to person, place, and time. She has normal reflexes.  Skin: Skin is warm and dry.  Psychiatric: She has a normal mood and affect. Her behavior is normal. Judgment and thought content normal.     No  exam data present  Activities of Daily Living In your present state of health, do you have any difficulty performing the following activities: 05/31/2015  Hearing? N  Vision? N  Difficulty concentrating or making decisions? N  Walking or climbing stairs? N  Dressing or bathing? N  Doing errands, shopping? N    Fall Risk Assessment Fall Risk  05/31/2015 11/27/2014  Falls in the past year? No Yes  Number falls in past yr: - 1  Injury with Fall? - No  Risk for fall due to : - Impaired balance/gait  Follow up - Falls prevention discussed     Patient reports there are safety devices in place in shower at home.   Depression Screen PHQ 2/9 Scores 05/31/2015 11/27/2014  PHQ - 2  Score 0 0    Cognitive Testing - 6-CIT   Correct? Score   What year is it? yes 0 Yes = 0    No = 4  What month is it? yes 0 Yes = 0    No = 3  Remember:     [place], [place], [place]     What time is it? yes 0 Yes = 0    No = 3  Count backwards from 20 to 1 yes 0 Correct = 0    1 error = 2   More than 1 error = 4  Say the months of the year in reverse. yes 0 Correct = 0    1 error = 2   More than 1 error = 4  What address did I ask you to remember? yes 0 Correct = 0  1 error = 2    2 error = 4    3 error = 6    4 error = 8    All wrong = 10       TOTAL SCORE  0/28   Interpretation:  Normal  Normal (0-7) Abnormal (8-28)     Assessment & Plan:     Initial Preventative Physical Exam  Reviewed patient's Family Medical History Reviewed and updated list of patient's medical providers Assessment of cognitive impairment was done Assessed patient's functional ability Established a written schedule for health screening [place] Completed and Reviewed  Exercise Activities and Dietary recommendations Goals    None      Immunization History  Administered Date(s) Administered  . Pneumococcal Conjugate-13 05/25/2014  . Pneumococcal Polysaccharide-23 05/20/2007  . Tdap 05/20/2007    Health Maintenance  Topic Date Due  . Hepatitis C Screening  16-Dec-1950  . ZOSTAVAX  06/02/2008  . INFLUENZA VACCINE  12/18/2014  . PNA vac Low Risk Adult (2 of 2 - PPSV23) 05/26/2015  . MAMMOGRAM  12/11/2016  . TETANUS/TDAP  05/19/2017  . COLONOSCOPY  01/18/2023  . DEXA SCAN  Completed      Discussed health benefits of physical activity, and encouraged her to engage in regular exercise appropriate for her age and condition.    ------------------------------------------------------------------------------------------------------------   Problem List Items Addressed This Visit      Digestive   Acid reflux   Relevant Medications   pantoprazole (PROTONIX) 40  MG tablet     Endocrine   Adult hypothyroidism   Relevant Medications   levothyroxine (SYNTHROID, LEVOTHROID) 100 MCG tablet   Other Relevant Orders   TSH     Musculoskeletal and Integument   OP (osteoporosis)   Relevant Medications   alendronate (FOSAMAX) 70 MG tablet     Other  Familial multiple lipoprotein-type hyperlipidemia   Relevant Medications   ezetimibe (ZETIA) 10 MG tablet   Other Relevant Orders   Lipid Profile   Restless leg   Relevant Medications   rOPINIRole (REQUIP) 1 MG tablet   Medicare annual wellness visit, subsequent - Primary    Other Visit Diagnoses    Osteoporosis        Relevant Medications    alendronate (FOSAMAX) 70 MG tablet    Acute maxillary sinusitis, recurrence not specified        Relevant Medications    azithromycin (ZITHROMAX) 250 MG tablet    Lumbosacral strain, initial encounter            Otilio Miu, MD [facility]  05/31/2015

## 2015-06-01 LAB — LIPID PANEL
CHOL/HDL RATIO: 4 ratio (ref 0.0–4.4)
Cholesterol, Total: 182 mg/dL (ref 100–199)
HDL: 46 mg/dL (ref >39)
LDL CALC: 113 mg/dL — AB (ref 0–99)
Triglycerides: 113 mg/dL (ref 0–149)
VLDL Cholesterol Cal: 23 mg/dL (ref 5–40)

## 2015-06-01 LAB — TSH: TSH: 0.143 u[IU]/mL — ABNORMAL LOW (ref 0.450–4.500)

## 2015-06-05 ENCOUNTER — Other Ambulatory Visit: Payer: Self-pay

## 2015-06-05 DIAGNOSIS — H2512 Age-related nuclear cataract, left eye: Secondary | ICD-10-CM | POA: Diagnosis not present

## 2015-06-11 DIAGNOSIS — H2512 Age-related nuclear cataract, left eye: Secondary | ICD-10-CM | POA: Diagnosis not present

## 2015-06-13 ENCOUNTER — Encounter: Payer: Self-pay | Admitting: *Deleted

## 2015-06-18 ENCOUNTER — Encounter
Admission: RE | Disposition: A | Discharge status: Home / Self Care | Payer: Self-pay | Source: Ambulatory Visit | Attending: Ophthalmology

## 2015-06-18 ENCOUNTER — Ambulatory Visit: Payer: Medicare Other | Admitting: Anesthesiology

## 2015-06-18 ENCOUNTER — Encounter: Payer: Self-pay | Admitting: *Deleted

## 2015-06-18 ENCOUNTER — Ambulatory Visit
Admission: RE | Admit: 2015-06-18 | Discharge: 2015-06-18 | Disposition: A | Discharge status: Home / Self Care | Payer: Medicare Other | Source: Ambulatory Visit | Attending: Ophthalmology | Admitting: Ophthalmology

## 2015-06-18 DIAGNOSIS — F17200 Nicotine dependence, unspecified, uncomplicated: Secondary | ICD-10-CM | POA: Insufficient documentation

## 2015-06-18 DIAGNOSIS — I2510 Atherosclerotic heart disease of native coronary artery without angina pectoris: Secondary | ICD-10-CM | POA: Diagnosis not present

## 2015-06-18 DIAGNOSIS — K5090 Crohn's disease, unspecified, without complications: Secondary | ICD-10-CM | POA: Insufficient documentation

## 2015-06-18 DIAGNOSIS — I10 Essential (primary) hypertension: Secondary | ICD-10-CM | POA: Diagnosis not present

## 2015-06-18 DIAGNOSIS — F329 Major depressive disorder, single episode, unspecified: Secondary | ICD-10-CM | POA: Insufficient documentation

## 2015-06-18 DIAGNOSIS — Z90710 Acquired absence of both cervix and uterus: Secondary | ICD-10-CM | POA: Insufficient documentation

## 2015-06-18 DIAGNOSIS — G35 Multiple sclerosis: Secondary | ICD-10-CM | POA: Insufficient documentation

## 2015-06-18 DIAGNOSIS — R011 Cardiac murmur, unspecified: Secondary | ICD-10-CM | POA: Diagnosis not present

## 2015-06-18 DIAGNOSIS — H2512 Age-related nuclear cataract, left eye: Secondary | ICD-10-CM | POA: Insufficient documentation

## 2015-06-18 DIAGNOSIS — E7800 Pure hypercholesterolemia, unspecified: Secondary | ICD-10-CM | POA: Diagnosis not present

## 2015-06-18 DIAGNOSIS — Z9841 Cataract extraction status, right eye: Secondary | ICD-10-CM | POA: Insufficient documentation

## 2015-06-18 DIAGNOSIS — E785 Hyperlipidemia, unspecified: Secondary | ICD-10-CM | POA: Diagnosis not present

## 2015-06-18 DIAGNOSIS — M810 Age-related osteoporosis without current pathological fracture: Secondary | ICD-10-CM | POA: Diagnosis not present

## 2015-06-18 DIAGNOSIS — R251 Tremor, unspecified: Secondary | ICD-10-CM | POA: Insufficient documentation

## 2015-06-18 DIAGNOSIS — Z885 Allergy status to narcotic agent status: Secondary | ICD-10-CM | POA: Diagnosis not present

## 2015-06-18 DIAGNOSIS — R569 Unspecified convulsions: Secondary | ICD-10-CM | POA: Insufficient documentation

## 2015-06-18 DIAGNOSIS — E079 Disorder of thyroid, unspecified: Secondary | ICD-10-CM | POA: Insufficient documentation

## 2015-06-18 HISTORY — DX: Crohn's disease, unspecified, without complications: K50.90

## 2015-06-18 HISTORY — PX: CATARACT EXTRACTION W/PHACO: SHX586

## 2015-06-18 SURGERY — PHACOEMULSIFICATION, CATARACT, WITH IOL INSERTION
Anesthesia: Monitor Anesthesia Care | Site: Eye | Laterality: Left | Wound class: Clean

## 2015-06-18 MED ORDER — MOXIFLOXACIN HCL 0.5 % OP SOLN
OPHTHALMIC | Status: DC | PRN
  Administered 2015-06-18: 1 [drp] via OPHTHALMIC

## 2015-06-18 MED ORDER — LIDOCAINE HCL (PF) 4 % IJ SOLN
INTRAMUSCULAR | Status: DC | PRN
  Administered 2015-06-18: 4 mL via OPHTHALMIC

## 2015-06-18 MED ORDER — MOXIFLOXACIN HCL 0.5 % OP SOLN
OPHTHALMIC | Status: AC
  Administered 2015-06-18: 1 [drp] via OPHTHALMIC
  Filled 2015-06-18: qty 3

## 2015-06-18 MED ORDER — LIDOCAINE HCL (PF) 4 % IJ SOLN
INTRAMUSCULAR | Status: DC | PRN
  Administered 2015-06-18: .5 mL via OPHTHALMIC

## 2015-06-18 MED ORDER — PROPARACAINE HCL 0.5 % OP SOLN
OPHTHALMIC | Status: DC | PRN
  Administered 2015-06-18: 1 [drp] via OPHTHALMIC

## 2015-06-18 MED ORDER — PHENYLEPHRINE HCL 10 % OP SOLN
1.0000 [drp] | OPHTHALMIC | Status: AC | PRN
  Administered 2015-06-18 (×4): 1 [drp] via OPHTHALMIC

## 2015-06-18 MED ORDER — SODIUM CHLORIDE 0.9 % IV SOLN
INTRAVENOUS | Status: DC
  Administered 2015-06-18: 10:00:00 via INTRAVENOUS

## 2015-06-18 MED ORDER — CYCLOPENTOLATE HCL 2 % OP SOLN
1.0000 [drp] | OPHTHALMIC | Status: AC | PRN
  Administered 2015-06-18 (×4): 1 [drp] via OPHTHALMIC

## 2015-06-18 MED ORDER — PHENYLEPHRINE HCL 10 % OP SOLN
OPHTHALMIC | Status: AC
Start: 2015-06-18 — End: 2015-06-18
  Administered 2015-06-18: 1 [drp] via OPHTHALMIC
  Filled 2015-06-18: qty 5

## 2015-06-18 MED ORDER — EPINEPHRINE HCL 1 MG/ML IJ SOLN
INTRAOCULAR | Status: DC | PRN
  Administered 2015-06-18: 1 mL via OPHTHALMIC

## 2015-06-18 MED ORDER — CYCLOPENTOLATE HCL 2 % OP SOLN
OPHTHALMIC | Status: AC
  Administered 2015-06-18: 1 [drp] via OPHTHALMIC
  Filled 2015-06-18: qty 2

## 2015-06-18 MED ORDER — CEFUROXIME OPHTHALMIC INJECTION 1 MG/0.1 ML
INJECTION | OPHTHALMIC | Status: AC
  Filled 2015-06-18: qty 0.1

## 2015-06-18 MED ORDER — NA CHONDROIT SULF-NA HYALURON 40-17 MG/ML IO SOLN
INTRAOCULAR | Status: AC
  Filled 2015-06-18: qty 1

## 2015-06-18 MED ORDER — EPINEPHRINE HCL 1 MG/ML IJ SOLN
INTRAMUSCULAR | Status: AC
  Filled 2015-06-18: qty 1

## 2015-06-18 MED ORDER — HYALURONIDASE HUMAN 150 UNIT/ML IJ SOLN
INTRAMUSCULAR | Status: AC
  Filled 2015-06-18: qty 1

## 2015-06-18 MED ORDER — CEFUROXIME OPHTHALMIC INJECTION 1 MG/0.1 ML
INJECTION | OPHTHALMIC | Status: DC | PRN
  Administered 2015-06-18: .1 mL via INTRACAMERAL

## 2015-06-18 MED ORDER — MOXIFLOXACIN HCL 0.5 % OP SOLN
1.0000 [drp] | OPHTHALMIC | Status: AC | PRN
  Administered 2015-06-18 (×3): 1 [drp] via OPHTHALMIC

## 2015-06-18 MED ORDER — EPINEPHRINE HCL 1 MG/ML IJ SOLN
INTRAMUSCULAR | Status: AC
  Filled 2015-06-18: qty 2

## 2015-06-18 MED ORDER — CARBACHOL 0.01 % IO SOLN
INTRAOCULAR | Status: DC | PRN
  Administered 2015-06-18: .5 mL via INTRAOCULAR

## 2015-06-18 MED ORDER — ALFENTANIL 500 MCG/ML IJ INJ
INJECTION | INTRAMUSCULAR | Status: DC | PRN
  Administered 2015-06-18 (×2): 250 ug via INTRAVENOUS

## 2015-06-18 MED ORDER — BUPIVACAINE HCL (PF) 0.75 % IJ SOLN
INTRAMUSCULAR | Status: AC
  Filled 2015-06-18: qty 10

## 2015-06-18 MED ORDER — LIDOCAINE HCL (PF) 4 % IJ SOLN
INTRAMUSCULAR | Status: AC
  Filled 2015-06-18: qty 10

## 2015-06-18 MED ORDER — TETRACAINE HCL 0.5 % OP SOLN
OPHTHALMIC | Status: DC | PRN
  Administered 2015-06-18: 1 [drp] via OPHTHALMIC

## 2015-06-18 MED ORDER — NA CHONDROIT SULF-NA HYALURON 40-17 MG/ML IO SOLN
INTRAOCULAR | Status: DC | PRN
  Administered 2015-06-18: 1 mL via INTRAOCULAR

## 2015-06-18 MED ORDER — PROPARACAINE HCL 0.5 % OP SOLN
OPHTHALMIC | Status: AC
  Filled 2015-06-18: qty 15

## 2015-06-18 MED ORDER — TETRACAINE HCL 0.5 % OP SOLN
OPHTHALMIC | Status: AC
  Filled 2015-06-18: qty 2

## 2015-06-18 SURGICAL SUPPLY — 31 items
CANNULA ANT/CHMB 27GA (MISCELLANEOUS) ×3 IMPLANT
CORD BIP STRL DISP 12FT (MISCELLANEOUS) ×3 IMPLANT
CUP MEDICINE 2OZ PLAST GRAD ST (MISCELLANEOUS) ×3 IMPLANT
DRAPE XRAY CASSETTE 23X24 (DRAPES) ×3 IMPLANT
ERASER HMR WETFIELD 18G (MISCELLANEOUS) ×3 IMPLANT
GLOVE BIO SURGEON STRL SZ8 (GLOVE) ×3 IMPLANT
GLOVE SURG LX 6.5 MICRO (GLOVE) ×2
GLOVE SURG LX 8.0 MICRO (GLOVE) ×2
GLOVE SURG LX STRL 6.5 MICRO (GLOVE) ×1 IMPLANT
GLOVE SURG LX STRL 8.0 MICRO (GLOVE) ×1 IMPLANT
GOWN STRL REUS W/ TWL LRG LVL3 (GOWN DISPOSABLE) ×1 IMPLANT
GOWN STRL REUS W/ TWL XL LVL3 (GOWN DISPOSABLE) ×1 IMPLANT
GOWN STRL REUS W/TWL LRG LVL3 (GOWN DISPOSABLE) ×2
GOWN STRL REUS W/TWL XL LVL3 (GOWN DISPOSABLE) ×2
LENS IOL ACRSF IQ TRC 4 23.0 (Intraocular Lens) ×1 IMPLANT
LENS IOL ACRYSOF IQ TORIC 23.0 (Intraocular Lens) ×2 IMPLANT
LENS IOL IQ TORIC 4 23.0 (Intraocular Lens) ×1 IMPLANT
PACK CATARACT (MISCELLANEOUS) ×3 IMPLANT
PACK CATARACT DINGLEDEIN LX (MISCELLANEOUS) ×3 IMPLANT
PACK EYE AFTER SURG (MISCELLANEOUS) ×3 IMPLANT
SHLD EYE VISITEC  UNIV (MISCELLANEOUS) ×3 IMPLANT
SOL BSS BAG (MISCELLANEOUS) ×3
SOL PREP PVP 2OZ (MISCELLANEOUS) ×3
SOLUTION BSS BAG (MISCELLANEOUS) ×1 IMPLANT
SOLUTION PREP PVP 2OZ (MISCELLANEOUS) ×1 IMPLANT
SUT SILK 5-0 (SUTURE) ×3 IMPLANT
SYR 3ML LL SCALE MARK (SYRINGE) ×3 IMPLANT
SYR 5ML LL (SYRINGE) ×3 IMPLANT
SYR TB 1ML 27GX1/2 LL (SYRINGE) ×3 IMPLANT
WATER STERILE IRR 1000ML POUR (IV SOLUTION) ×3 IMPLANT
WIPE NON LINTING 3.25X3.25 (MISCELLANEOUS) ×3 IMPLANT

## 2015-06-18 NOTE — Anesthesia Preprocedure Evaluation (Signed)
Anesthesia Evaluation  Patient identified by MRN, date of birth, ID band Patient awake    Reviewed: Allergy & Precautions, H&P , NPO status , Patient's Chart, lab work & pertinent test results, reviewed documented beta blocker date and time   Airway Mallampati: II  TM Distance: >3 FB Neck ROM: full    Dental no notable dental hx. (+) Teeth Intact   Pulmonary neg pulmonary ROS, Current Smoker,    Pulmonary exam normal breath sounds clear to auscultation       Cardiovascular Exercise Tolerance: Good hypertension, + CAD  negative cardio ROS  + Valvular Problems/Murmurs  Rhythm:regular Rate:Normal     Neuro/Psych Seizures -,  PSYCHIATRIC DISORDERS  Neuromuscular disease negative neurological ROS  negative psych ROS   GI/Hepatic negative GI ROS, Neg liver ROS, PUD, GERD  ,  Endo/Other  negative endocrine ROSdiabetesHypothyroidism   Renal/GU      Musculoskeletal   Abdominal   Peds  Hematology negative hematology ROS (+)   Anesthesia Other Findings   Reproductive/Obstetrics negative OB ROS                             Anesthesia Physical Anesthesia Plan  ASA: III  Anesthesia Plan: MAC   Post-op Pain Management:    Induction:   Airway Management Planned:   Additional Equipment:   Intra-op Plan:   Post-operative Plan:   Informed Consent: I have reviewed the patients History and Physical, chart, labs and discussed the procedure including the risks, benefits and alternatives for the proposed anesthesia with the patient or authorized representative who has indicated his/her understanding and acceptance.     Plan Discussed with: CRNA  Anesthesia Plan Comments:         Anesthesia Quick Evaluation

## 2015-06-18 NOTE — Discharge Instructions (Addendum)
See handout. Eye Surgery Discharge Instructions  Expect mild scratchy sensation or mild soreness. DO NOT RUB YOUR EYE!  The day of surgery:  Minimal physical activity, but bed rest is not required  No reading, computer work, or close hand work  No bending, lifting, or straining.  May watch TV  For 24 hours:  No driving, legal decisions, or alcoholic beverages  Safety precautions  Eat anything you prefer: It is better to start with liquids, then soup then solid foods.  _____ Eye patch should be worn until postoperative exam tomorrow.  ____ Solar shield eyeglasses should be worn for comfort in the sunlight/patch while sleeping  Resume all regular medications including aspirin or Coumadin if these were discontinued prior to surgery. You may shower, bathe, shave, or wash your hair. Tylenol may be taken for mild discomfort.  Call your doctor if you experience significant pain, nausea, or vomiting, fever > 101 or other signs of infection. [phone] or [phone] Specific instructions:  Follow-up Information    Follow up with Estill Cotta, MD. Go on 06/19/2015.   Specialty:  Ophthalmology   Why:  Appointment time is set for 9:45 am TOMORROW, For wound re-check   Contact information:   [place] [address] [phone]     AMBULATORY SURGERY  DISCHARGE INSTRUCTIONS   1) The drugs that you were given will stay in your system until tomorrow so for the next 24 hours you should not:  A) Drive an automobile B) Make any legal decisions C) Drink any alcoholic beverage   2) You may resume regular meals tomorrow.  Today it is better to start with liquids and gradually work up to solid foods.  You may eat anything you prefer, but it is better to start with liquids, then soup and crackers, and gradually work up to solid foods.   3) Please notify your doctor immediately if you have any unusual bleeding, trouble breathing, redness and pain at the  surgery site, drainage, fever, or pain not relieved by medication.    4) Additional Instructions:        Please contact your physician with any problems or Same Day Surgery at [phone], Monday through Friday 6 am to 4 pm, or [place] at [facility] number at [phone].

## 2015-06-18 NOTE — Transfer of Care (Signed)
Immediate Anesthesia Transfer of Care Note  Patient: HAYLEY HORN  Procedure(s) Performed: Procedure(s) with comments: CATARACT EXTRACTION PHACO AND INTRAOCULAR LENS PLACEMENT (IOC) (Left) - [place] 01:36 AP% 26.1 CDE 42.19 fluid pack lo0t # [id number]  Patient Location: PACU and Short Stay  Anesthesia Type:MAC  Level of Consciousness: awake, alert  and oriented  Airway & Oxygen Therapy: Patient Spontanous Breathing  Post-op Assessment: Report given to RN and Post -op Vital signs reviewed and stable  Post vital signs: Reviewed and stable  Last Vitals:  Filed Vitals:   06/18/15 0905 06/18/15 1104  BP: 122/76 112/58  Pulse: 66   Temp: 36.1 C 36.2 C  Resp: 16 16    Complications: No apparent anesthesia complications

## 2015-06-18 NOTE — H&P (Signed)
See scanned note.

## 2015-06-18 NOTE — Op Note (Signed)
Date of Surgery: 06/18/2015 Date of Dictation: 06/18/2015 11:01 AM Pre-operative Diagnosis:Nuclear Sclerotic Cataract left Eye Post-operative Diagnosis: same Procedure performed: Extra-capsular Cataract Extraction (ECCE) with placement of a posterior chamber intraocular lens (IOL) left Eye IOL:  Implant Name Type Inv. Item Serial No. Manufacturer Lot No. LRB No. Used  acrysof toric lens     [id number] ALCON   Left 1   Anesthesia: 2% Lidocaine and 4% Marcaine in a 50/50 mixture with 10 unites/ml of Hylenex given as a peribulbar Anesthesiologist: Anesthesiologist: Molli Barrows, MD CRNA: Jonna Clark, CRNA Complications: none Estimated Blood Loss: less than 1 ml  Description of procedure:  The patient sat upright and the eyes were anesthetized with topical proparacaine. With the patient fixing at a distant target the 3 o'clock and 9 o'clock positions at the limbus were marked with an sterile indelible surgical marking pen.  The patient was given anesthesia and sedation via intravenous access. The patient was then prepped and draped in the usual fashion. A 25-gauge needle was bent to use for starting the capsulorhexis. A 5-0 silk suture was placed through the conjunctiva superior and inferiorly to serve as bridle sutures.   The [place] system was engaged and registration was performed. The positions of the incisions and the steep axis of the astigmatism were identified by the [place] system and marked with an indelible pen. The Verion heads up display was turned off.  Hemostasis was obtained at the the position of the main incision using an eraser cautery. A partial thickness groove was made at the at that location with a 64 [place] blade and this was dissected anteriorly with an [employer]aya. The anterior chamber was entered at 10 o'clock with a 1.0 mm paracentesis knife and through the lamellar dissection with a 2.6 mm Alcon keratome. Epi-Shugarcaine 0.5 CC [9 cc BSS Plus (Alcon), 3 cc  4% preservative-free lidocaine ([place]) and 4 cc 1:1000 preservative-free, bisulfite-free epinephrine] was injected into the anterior chamber via the paracentesis tract. DiscoVisc was injected to replace the aqueous and a continuous tear curvilinear capsulorhexis was performed using a bent 25-gauge needle.  Balance salt on a syringe was used to perform hydro-dissection and phacoemulsification was carried out using a divide and conquer technique. Procedure(s) with comments: CATARACT EXTRACTION PHACO AND INTRAOCULAR LENS PLACEMENT (IOC) (Left) - [place] 01:36 AP% 26.1 CDE 42.19 fluid pack lo0t # [id number]. Irrigation/aspiration was used to remove the residual cortex and the capsular bag was inflated with DiscoVisc. The intraocular lens was inserted into the capsular bag using a Monarch Delivery System cartridge. The Verion heads up display was turned on and the lens was rotated so that the marks on the base of the haptics were aligned with the steep axis of astigmatism as identified by the [place] unit.   Irrigation/aspiration was used to remove the residual DiscoVisc. The I/A hand piece was pressed down on top of the lens to prevent rotation. The wound was inflated with balanced salt and checked for leaks. None were found. The position of the [place] lens was reconfirmed using the [place] unit. Miostat was injected via the paracentesis track and 0.1 ml of cefuroxime containing 1 mg of drug was injected via the paracentesis track. The wound was checked for leaks again and none were found.   The bridal sutures were removed and two drops of Vigamox were placed on the eye. An eye shield was placed to protect the eye and the patient was discharged to the recovery area in good condition.  Rani Sisney MD @T @

## 2015-06-18 NOTE — Anesthesia Postprocedure Evaluation (Signed)
Anesthesia Post Note  Patient: Brooke Juarez  Procedure(s) Performed: Procedure(s) (LRB): CATARACT EXTRACTION PHACO AND INTRAOCULAR LENS PLACEMENT (IOC) (Left)  Patient location during evaluation: Short Stay Anesthesia Type: MAC Level of consciousness: awake and alert Pain management: pain level controlled Vital Signs Assessment: post-procedure vital signs reviewed and stable Respiratory status: spontaneous breathing Cardiovascular status: stable Anesthetic complications: no    Last Vitals:  Filed Vitals:   06/18/15 1104 06/18/15 1105  BP: 112/58 112/58  Pulse:  63  Temp: 36.2 C 36.7 C  Resp: 16 16    Last Pain:  Filed Vitals:   06/18/15 1106  PainSc: 2                  Lanora Manis

## 2015-06-18 NOTE — Interval H&P Note (Signed)
History and Physical Interval Note:  06/18/2015 10:19 AM  Brooke Juarez  has presented today for surgery, with the diagnosis of CATARACT  The various methods of treatment have been discussed with the patient and family. After consideration of risks, benefits and other options for treatment, the patient has consented to  Procedure(s): CATARACT EXTRACTION PHACO AND INTRAOCULAR LENS PLACEMENT ([place]) (Left) as a surgical intervention .  The patient's history has been reviewed, patient examined, no change in status, stable for surgery.  I have reviewed the patient's chart and labs.  Questions were answered to the patient's satisfaction.     Joclyn Alsobrook

## 2015-06-19 ENCOUNTER — Encounter: Payer: Self-pay | Admitting: Ophthalmology

## 2015-06-28 ENCOUNTER — Encounter: Payer: Self-pay | Admitting: Ophthalmology

## 2015-07-02 ENCOUNTER — Other Ambulatory Visit: Payer: Medicare Other

## 2015-07-02 DIAGNOSIS — E039 Hypothyroidism, unspecified: Secondary | ICD-10-CM

## 2015-07-03 LAB — TSH: TSH: 0.225 u[IU]/mL — AB (ref 0.450–4.500)

## 2015-07-04 DIAGNOSIS — F330 Major depressive disorder, recurrent, mild: Secondary | ICD-10-CM | POA: Diagnosis not present

## 2015-07-09 ENCOUNTER — Other Ambulatory Visit: Payer: Self-pay

## 2015-08-27 DIAGNOSIS — D225 Melanocytic nevi of trunk: Secondary | ICD-10-CM | POA: Diagnosis not present

## 2015-08-27 DIAGNOSIS — L821 Other seborrheic keratosis: Secondary | ICD-10-CM | POA: Diagnosis not present

## 2015-08-27 DIAGNOSIS — L298 Other pruritus: Secondary | ICD-10-CM | POA: Diagnosis not present

## 2015-08-27 DIAGNOSIS — Z1283 Encounter for screening for malignant neoplasm of skin: Secondary | ICD-10-CM | POA: Diagnosis not present

## 2015-08-27 DIAGNOSIS — D224 Melanocytic nevi of scalp and neck: Secondary | ICD-10-CM | POA: Diagnosis not present

## 2015-08-27 DIAGNOSIS — D485 Neoplasm of uncertain behavior of skin: Secondary | ICD-10-CM | POA: Diagnosis not present

## 2015-10-24 DIAGNOSIS — R413 Other amnesia: Secondary | ICD-10-CM | POA: Diagnosis not present

## 2015-10-24 DIAGNOSIS — M542 Cervicalgia: Secondary | ICD-10-CM | POA: Diagnosis not present

## 2015-10-24 DIAGNOSIS — M6289 Other specified disorders of muscle: Secondary | ICD-10-CM | POA: Diagnosis not present

## 2015-10-24 DIAGNOSIS — R404 Transient alteration of awareness: Secondary | ICD-10-CM | POA: Diagnosis not present

## 2015-10-24 DIAGNOSIS — R2689 Other abnormalities of gait and mobility: Secondary | ICD-10-CM | POA: Diagnosis not present

## 2015-10-25 DIAGNOSIS — F330 Major depressive disorder, recurrent, mild: Secondary | ICD-10-CM | POA: Diagnosis not present

## 2015-10-26 ENCOUNTER — Other Ambulatory Visit: Payer: Self-pay | Admitting: Neurology

## 2015-10-26 DIAGNOSIS — R413 Other amnesia: Secondary | ICD-10-CM

## 2015-11-09 DIAGNOSIS — F99 Mental disorder, not otherwise specified: Secondary | ICD-10-CM | POA: Diagnosis not present

## 2015-11-15 ENCOUNTER — Ambulatory Visit
Admission: RE | Admit: 2015-11-15 | Discharge: 2015-11-15 | Disposition: A | Discharge status: Home / Self Care | Payer: Medicare Other | Source: Ambulatory Visit | Attending: Neurology | Admitting: Neurology

## 2015-11-15 DIAGNOSIS — R413 Other amnesia: Secondary | ICD-10-CM | POA: Diagnosis not present

## 2015-11-15 DIAGNOSIS — R9082 White matter disease, unspecified: Secondary | ICD-10-CM | POA: Diagnosis not present

## 2015-11-15 DIAGNOSIS — R404 Transient alteration of awareness: Secondary | ICD-10-CM | POA: Diagnosis not present

## 2015-11-15 LAB — POCT I-STAT CREATININE: Creatinine, Ser: 0.9 mg/dL (ref 0.44–1.00)

## 2015-11-15 MED ORDER — GADOBENATE DIMEGLUMINE 529 MG/ML IV SOLN
15.0000 mL | Freq: Once | INTRAVENOUS | Status: AC | PRN
  Administered 2015-11-15: 13 mL via INTRAVENOUS

## 2015-12-03 ENCOUNTER — Ambulatory Visit (INDEPENDENT_AMBULATORY_CARE_PROVIDER_SITE_OTHER): Payer: Medicare Other | Admitting: Family Medicine

## 2015-12-03 ENCOUNTER — Encounter: Payer: Self-pay | Admitting: Family Medicine

## 2015-12-03 VITALS — BP 112/64 | HR 68 | Temp 98.7°F | Resp 16 | Ht 62.0 in | Wt 142.0 lb

## 2015-12-03 DIAGNOSIS — E039 Hypothyroidism, unspecified: Secondary | ICD-10-CM

## 2015-12-03 DIAGNOSIS — Z0000 Encounter for general adult medical examination without abnormal findings: Secondary | ICD-10-CM

## 2015-12-03 DIAGNOSIS — E784 Other hyperlipidemia: Secondary | ICD-10-CM

## 2015-12-03 DIAGNOSIS — E7849 Other hyperlipidemia: Secondary | ICD-10-CM

## 2015-12-03 DIAGNOSIS — M810 Age-related osteoporosis without current pathological fracture: Secondary | ICD-10-CM

## 2015-12-03 MED ORDER — LEVOTHYROXINE SODIUM 100 MCG PO TABS: ORAL_TABLET | ORAL | Status: DC

## 2015-12-03 NOTE — Progress Notes (Signed)
Name: Brooke Juarez   MRN: [id number]    DOB: [birth year]   Date:12/03/2015       Progress Note  Subjective  Chief Complaint  Chief Complaint  Patient presents with  . Annual Exam    HPI Comments: Patient presents for annual physical exam. Thyroid Problem Patient reports no anxiety, constipation, diarrhea, palpitations or weight loss.    No problem-specific assessment & plan notes found for this encounter.   Past Medical History  Diagnosis Date  . Heart murmur   . Seizures ([place])   . Tremors of nervous system   . Neuromuscular disorder ([place])     multiple sclerosis  . MS (multiple sclerosis) ([place])   . Depression   . Hypothyroidism   . Restless leg   . GERD (gastroesophageal reflux disease)   . Hyperlipidemia   . Crohn disease [facility])     Past Surgical History  Procedure Laterality Date  . Vaginal hysterectomy    . Nasal sinus surgery    . Tonsillectomy    . Foot surgery    . Tubal ligation    . Cataract extraction w/phaco Right 10/30/2014    Procedure: CATARACT EXTRACTION PHACO AND INTRAOCULAR LENS PLACEMENT (IOC);  Surgeon: Estill Cotta, MD;  Location: [facility] ORS;  Service: Ophthalmology;  Laterality: Right;  [place]  1:55.7   . Colonoscopy  2014    normal- Rein- repeat in 5 years  . Eye surgery    . Cataract extraction w/phaco Left 06/18/2015    Procedure: CATARACT EXTRACTION PHACO AND INTRAOCULAR LENS PLACEMENT (IOC);  Surgeon: Estill Cotta, MD;  Location: [facility] ORS;  Service: Ophthalmology;  Laterality: Left;  [place] 01:36 AP% 26.1 CDE 42.19 fluid pack lo0t # [id number] H    Family History  Problem Relation Age of Onset  . Cancer Mother     Social History   Social History  . Marital Status: Married    Spouse Name: N/A  . Number of Children: N/A  . Years of Education: N/A   Occupational History  . Not on file.   Social History Main Topics  . Smoking status: Current Every Day Smoker  . Smokeless tobacco: Not on file  . Alcohol Use: No  . Drug Use: No  .  Sexual Activity: Not Currently   Other Topics Concern  . Not on file   Social History Narrative    Allergies  Allergen Reactions  . Codeine      Review of Systems  Constitutional: Negative for fever, chills, weight loss and malaise/fatigue.  HENT: Negative for ear discharge, ear pain and sore throat.   Eyes: Negative for blurred vision.  Respiratory: Negative for cough, sputum production, shortness of breath and wheezing.   Cardiovascular: Negative for chest pain, palpitations and leg swelling.  Gastrointestinal: Negative for heartburn, nausea, abdominal pain, diarrhea, constipation, blood in stool and melena.  Genitourinary: Negative for dysuria, urgency, frequency and hematuria.  Musculoskeletal: Negative for myalgias, back pain, joint pain and neck pain.  Skin: Negative for rash.  Neurological: Negative for dizziness, tingling, sensory change, focal weakness and headaches.  Endo/Heme/Allergies: Negative for environmental allergies and polydipsia. Does not bruise/bleed easily.  Psychiatric/Behavioral: Negative for depression and suicidal ideas. The patient is not nervous/anxious and does not have insomnia.      Objective  Filed Vitals:   12/03/15 0916  BP: 112/64  Pulse: 68  Temp: 98.7 F (37.1 C)  TempSrc: Oral  Resp: 16  Height: 5' 2"  (1.575 m)  Weight: 142 lb (64.411 kg)  Physical Exam  Constitutional: She is well-developed, well-nourished, and in no distress. No distress.  HENT:  Head: Normocephalic and atraumatic.  Right Ear: Tympanic membrane, external ear and ear canal normal.  Left Ear: Tympanic membrane, external ear and ear canal normal.  Nose: Nose normal.  Mouth/Throat: Oropharynx is clear and moist.  Eyes: Conjunctivae and EOM are normal. Pupils are equal, round, and reactive to light. Right eye exhibits no discharge. Left eye exhibits no discharge.  Neck: Normal range of motion. Neck supple. No JVD present. No thyromegaly present.   Cardiovascular: Normal rate, regular rhythm, S1 normal, normal heart sounds, intact distal pulses and normal pulses.  Exam reveals no gallop and no friction rub.   No murmur heard. Pulmonary/Chest: Effort normal and breath sounds normal. She has no wheezes. She has no rales. She exhibits no tenderness. Right breast exhibits no inverted nipple, no mass, no nipple discharge, no skin change and no tenderness. Left breast exhibits no inverted nipple, no mass, no nipple discharge, no skin change and no tenderness. Breasts are symmetrical.  Abdominal: Soft. Bowel sounds are normal. She exhibits no mass. There is no tenderness. There is no guarding.  Musculoskeletal: Normal range of motion. She exhibits no edema.  Lymphadenopathy:    She has no cervical adenopathy.  Neurological: She is alert. She has normal reflexes.  Skin: Skin is warm and dry. She is not diaphoretic.  Psychiatric: Mood and affect normal.  Nursing note and vitals reviewed.     Assessment & Plan  Problem List Items Addressed This Visit      Endocrine   Adult hypothyroidism   Relevant Medications   levothyroxine (SYNTHROID, LEVOTHROID) 100 MCG tablet     Musculoskeletal and Integument   OP (osteoporosis)     Other   Familial multiple lipoprotein-type hyperlipidemia    Other Visit Diagnoses    Annual physical exam    -  Primary         Dr. Otilio Miu [facility]  12/03/2015

## 2015-12-12 ENCOUNTER — Other Ambulatory Visit: Payer: Self-pay | Admitting: Family Medicine

## 2015-12-12 DIAGNOSIS — K219 Gastro-esophageal reflux disease without esophagitis: Secondary | ICD-10-CM

## 2015-12-12 DIAGNOSIS — G2581 Restless legs syndrome: Secondary | ICD-10-CM

## 2015-12-27 DIAGNOSIS — R2689 Other abnormalities of gait and mobility: Secondary | ICD-10-CM | POA: Diagnosis not present

## 2015-12-27 DIAGNOSIS — M542 Cervicalgia: Secondary | ICD-10-CM | POA: Diagnosis not present

## 2015-12-27 DIAGNOSIS — R413 Other amnesia: Secondary | ICD-10-CM | POA: Diagnosis not present

## 2015-12-27 DIAGNOSIS — R200 Anesthesia of skin: Secondary | ICD-10-CM | POA: Diagnosis not present

## 2015-12-27 DIAGNOSIS — R404 Transient alteration of awareness: Secondary | ICD-10-CM | POA: Diagnosis not present

## 2016-01-11 ENCOUNTER — Other Ambulatory Visit: Payer: Self-pay | Admitting: Family Medicine

## 2016-01-11 DIAGNOSIS — K219 Gastro-esophageal reflux disease without esophagitis: Secondary | ICD-10-CM

## 2016-01-11 DIAGNOSIS — G2581 Restless legs syndrome: Secondary | ICD-10-CM

## 2016-01-23 DIAGNOSIS — R200 Anesthesia of skin: Secondary | ICD-10-CM | POA: Diagnosis not present

## 2016-02-01 ENCOUNTER — Encounter: Payer: Self-pay | Admitting: Family Medicine

## 2016-02-01 ENCOUNTER — Ambulatory Visit (INDEPENDENT_AMBULATORY_CARE_PROVIDER_SITE_OTHER): Payer: Medicare Other | Admitting: Family Medicine

## 2016-02-01 VITALS — BP 120/78 | HR 76 | Ht 62.0 in | Wt 145.0 lb

## 2016-02-01 DIAGNOSIS — L0100 Impetigo, unspecified: Secondary | ICD-10-CM | POA: Diagnosis not present

## 2016-02-01 DIAGNOSIS — M5432 Sciatica, left side: Secondary | ICD-10-CM | POA: Diagnosis not present

## 2016-02-01 DIAGNOSIS — M5431 Sciatica, right side: Secondary | ICD-10-CM

## 2016-02-01 DIAGNOSIS — J0100 Acute maxillary sinusitis, unspecified: Secondary | ICD-10-CM

## 2016-02-01 MED ORDER — AMOXICILLIN-POT CLAVULANATE 875-125 MG PO TABS: 1.0000 | ORAL_TABLET | Freq: Two times a day (BID) | ORAL | 0 refills | Status: DC

## 2016-02-01 NOTE — Progress Notes (Signed)
Name: Brooke Juarez   MRN: [id number]    DOB: 01-21-1951   Date:02/01/2016       Progress Note  Subjective  Chief Complaint  Chief Complaint  Patient presents with  . Sinusitis    cong and sneezing. Drainage- cough. facial pressure bilateral    Sinusitis  This is a new problem. The current episode started 1 to 4 weeks ago. The problem has been gradually worsening since onset. There has been no fever. Associated symptoms include congestion, coughing, ear pain, sinus pressure and sneezing. Pertinent negatives include no chills, diaphoresis, headaches, hoarse voice, neck pain, shortness of breath, sore throat or swollen glands. (Facial pain) Past treatments include nothing. The treatment provided mild relief.    No problem-specific Assessment & Plan notes found for this encounter.   Past Medical History:  Diagnosis Date  . Crohn disease ([place])   . Depression   . GERD (gastroesophageal reflux disease)   . Heart murmur   . Hyperlipidemia   . Hypothyroidism   . MS (multiple sclerosis) ([place])   . Neuromuscular disorder ([place])    multiple sclerosis  . Restless leg   . Seizures ([place])   . Tremors of nervous system     Past Surgical History:  Procedure Laterality Date  . CATARACT EXTRACTION W/PHACO Right 10/30/2014   Procedure: CATARACT EXTRACTION PHACO AND INTRAOCULAR LENS PLACEMENT ([place]);  Surgeon: Estill Cotta, MD;  Location: [facility] ORS;  Service: Ophthalmology;  Laterality: Right;  [place]  1:55.7   . CATARACT EXTRACTION W/PHACO Left 06/18/2015   Procedure: CATARACT EXTRACTION PHACO AND INTRAOCULAR LENS PLACEMENT (IOC);  Surgeon: Estill Cotta, MD;  Location: [facility] ORS;  Service: Ophthalmology;  Laterality: Left;  [place] 01:36 AP% 26.1 CDE 42.19 fluid pack lo0t # [id number] H  . COLONOSCOPY  2014   normal- Rein- repeat in 5 years  . EYE SURGERY    . FOOT SURGERY    . NASAL SINUS SURGERY    . TONSILLECTOMY    . TUBAL LIGATION    . VAGINAL HYSTERECTOMY      Family History  Problem  Relation Age of Onset  . Cancer Mother     Social History   Social History  . Marital status: Married    Spouse name: N/A  . Number of children: N/A  . Years of education: N/A   Occupational History  . Not on file.   Social History Main Topics  . Smoking status: Current Every Day Smoker  . Smokeless tobacco: Not on file  . Alcohol use No  . Drug use: No  . Sexual activity: Not Currently   Other Topics Concern  . Not on file   Social History Narrative  . No narrative on file    Allergies  Allergen Reactions  . Codeine      Review of Systems  Constitutional: Negative for chills, diaphoresis, fever, malaise/fatigue and weight loss.  HENT: Positive for congestion, ear pain, sinus pressure and sneezing. Negative for ear discharge, hoarse voice and sore throat.   Eyes: Negative for blurred vision.  Respiratory: Positive for cough. Negative for sputum production, shortness of breath and wheezing.   Cardiovascular: Negative for chest pain, palpitations and leg swelling.  Gastrointestinal: Negative for abdominal pain, blood in stool, constipation, diarrhea, heartburn, melena and nausea.  Genitourinary: Negative for dysuria, frequency, hematuria and urgency.  Musculoskeletal: Negative for back pain, joint pain, myalgias and neck pain.  Skin: Negative for rash.  Neurological: Negative for dizziness, tingling, sensory change, focal weakness and headaches.  Endo/Heme/Allergies: Negative for environmental allergies and polydipsia. Does not bruise/bleed easily.  Psychiatric/Behavioral: Negative for depression and suicidal ideas. The patient is not nervous/anxious and does not have insomnia.      Objective  Vitals:   02/01/16 1035  BP: 120/78  Pulse: 76  Weight: 145 lb (65.8 kg)  Height: 5' 2"  (1.575 m)    Physical Exam  Constitutional: She is well-developed, well-nourished, and in no distress. No distress.  HENT:  Head: Normocephalic and atraumatic.  Right Ear:  External ear normal.  Left Ear: External ear normal.  Nose: Right sinus exhibits maxillary sinus tenderness. Left sinus exhibits maxillary sinus tenderness.  Mouth/Throat: Oropharynx is clear and moist.  Eyes: Conjunctivae and EOM are normal. Pupils are equal, round, and reactive to light. Right eye exhibits no discharge. Left eye exhibits no discharge.  Neck: Normal range of motion. Neck supple. No JVD present. No thyromegaly present.  Cardiovascular: Normal rate, regular rhythm, normal heart sounds and intact distal pulses.  Exam reveals no gallop and no friction rub.   No murmur heard. Pulmonary/Chest: Effort normal and breath sounds normal.  Abdominal: Soft. Bowel sounds are normal. She exhibits no mass. There is no tenderness. There is no guarding.  Musculoskeletal: Normal range of motion. She exhibits no edema.  Lymphadenopathy:    She has no cervical adenopathy.  Neurological: She is alert. She has normal reflexes.  Skin: Skin is warm and dry. Rash noted. She is not diaphoretic. There is erythema.  Psychiatric: Mood and affect normal.  Nursing note and vitals reviewed.     Assessment & Plan  Problem List Items Addressed This Visit    None    Visit Diagnoses    Acute maxillary sinusitis, recurrence not specified    -  Primary   Relevant Medications   amoxicillin-clavulanate (AUGMENTIN) 875-125 MG tablet   Impetigo       neosporin suggested   Relevant Medications   amoxicillin-clavulanate (AUGMENTIN) 875-125 MG tablet   Bilateral sciatica       Relevant Medications   meloxicam (MOBIC) 15 MG tablet        Dr. Deanna Jones [facility]  02/01/16

## 2016-02-07 ENCOUNTER — Other Ambulatory Visit: Payer: Self-pay | Admitting: Family Medicine

## 2016-02-07 DIAGNOSIS — K219 Gastro-esophageal reflux disease without esophagitis: Secondary | ICD-10-CM

## 2016-02-07 DIAGNOSIS — G2581 Restless legs syndrome: Secondary | ICD-10-CM

## 2016-02-19 ENCOUNTER — Other Ambulatory Visit: Payer: Self-pay

## 2016-02-19 DIAGNOSIS — L0100 Impetigo, unspecified: Secondary | ICD-10-CM

## 2016-02-19 DIAGNOSIS — H353131 Nonexudative age-related macular degeneration, bilateral, early dry stage: Secondary | ICD-10-CM | POA: Diagnosis not present

## 2016-02-19 DIAGNOSIS — J0100 Acute maxillary sinusitis, unspecified: Secondary | ICD-10-CM

## 2016-02-19 MED ORDER — AZITHROMYCIN 250 MG PO TABS: ORAL_TABLET | ORAL | 0 refills | Status: DC

## 2016-02-27 DIAGNOSIS — F330 Major depressive disorder, recurrent, mild: Secondary | ICD-10-CM | POA: Diagnosis not present

## 2016-03-04 ENCOUNTER — Other Ambulatory Visit: Payer: Self-pay | Admitting: Family Medicine

## 2016-03-04 DIAGNOSIS — K219 Gastro-esophageal reflux disease without esophagitis: Secondary | ICD-10-CM

## 2016-03-04 DIAGNOSIS — G2581 Restless legs syndrome: Secondary | ICD-10-CM

## 2016-04-03 ENCOUNTER — Other Ambulatory Visit: Payer: Self-pay | Admitting: Family Medicine

## 2016-04-03 DIAGNOSIS — K219 Gastro-esophageal reflux disease without esophagitis: Secondary | ICD-10-CM

## 2016-04-03 DIAGNOSIS — G2581 Restless legs syndrome: Secondary | ICD-10-CM

## 2016-04-16 DIAGNOSIS — K219 Gastro-esophageal reflux disease without esophagitis: Secondary | ICD-10-CM | POA: Diagnosis not present

## 2016-04-16 DIAGNOSIS — K52831 Collagenous colitis: Secondary | ICD-10-CM | POA: Diagnosis not present

## 2016-06-09 ENCOUNTER — Ambulatory Visit (INDEPENDENT_AMBULATORY_CARE_PROVIDER_SITE_OTHER): Payer: Medicare Other | Admitting: Family Medicine

## 2016-06-09 ENCOUNTER — Encounter (INDEPENDENT_AMBULATORY_CARE_PROVIDER_SITE_OTHER): Payer: Self-pay

## 2016-06-09 ENCOUNTER — Encounter: Payer: Self-pay | Admitting: Family Medicine

## 2016-06-09 VITALS — BP 110/60 | HR 62 | Ht 62.0 in | Wt 149.0 lb

## 2016-06-09 DIAGNOSIS — F330 Major depressive disorder, recurrent, mild: Secondary | ICD-10-CM

## 2016-06-09 DIAGNOSIS — G2581 Restless legs syndrome: Secondary | ICD-10-CM | POA: Diagnosis not present

## 2016-06-09 DIAGNOSIS — E784 Other hyperlipidemia: Secondary | ICD-10-CM | POA: Diagnosis not present

## 2016-06-09 DIAGNOSIS — E039 Hypothyroidism, unspecified: Secondary | ICD-10-CM

## 2016-06-09 DIAGNOSIS — M8000XD Age-related osteoporosis with current pathological fracture, unspecified site, subsequent encounter for fracture with routine healing: Secondary | ICD-10-CM

## 2016-06-09 DIAGNOSIS — K219 Gastro-esophageal reflux disease without esophagitis: Secondary | ICD-10-CM

## 2016-06-09 DIAGNOSIS — E7849 Other hyperlipidemia: Secondary | ICD-10-CM

## 2016-06-09 DIAGNOSIS — M8000XS Age-related osteoporosis with current pathological fracture, unspecified site, sequela: Secondary | ICD-10-CM

## 2016-06-09 MED ORDER — ASPIRIN 81 MG PO TABS: 81.0000 mg | ORAL_TABLET | Freq: Every day | ORAL | 11 refills | Status: DC

## 2016-06-09 MED ORDER — ALENDRONATE SODIUM 70 MG PO TABS: 70.0000 mg | ORAL_TABLET | ORAL | 5 refills | Status: DC

## 2016-06-09 MED ORDER — ROPINIROLE HCL 1 MG PO TABS: 1.0000 mg | ORAL_TABLET | Freq: Every day | ORAL | 5 refills | Status: DC

## 2016-06-09 MED ORDER — LEVOTHYROXINE SODIUM 100 MCG PO TABS: ORAL_TABLET | ORAL | 5 refills | Status: DC

## 2016-06-09 MED ORDER — TRAZODONE HCL 100 MG PO TABS: 100.0000 mg | ORAL_TABLET | Freq: Every day | ORAL | 5 refills | Status: DC

## 2016-06-09 MED ORDER — PANTOPRAZOLE SODIUM 40 MG PO TBEC: 40.0000 mg | DELAYED_RELEASE_TABLET | Freq: Every day | ORAL | 5 refills | Status: DC

## 2016-06-09 NOTE — Progress Notes (Signed)
Name: Brooke Juarez   MRN: [id number]    DOB: 07/17/1950   Date:06/09/2016       Progress Note  Subjective  Chief Complaint  Chief Complaint  Patient presents with  . Hypothyroidism  . restless leg  . Gastroesophageal Reflux  . Depression  . Osteoporosis    Patient needs refills for restless leg/osteoporosis.   Gastroesophageal Reflux  She reports no abdominal pain, no chest pain, no coughing, no dysphagia, no heartburn, no hoarse voice, no nausea, no sore throat, no stridor, no tooth decay or no wheezing. This is a chronic problem. The current episode started more than 1 year ago. The problem has been gradually worsening. The symptoms are aggravated by certain foods. Pertinent negatives include no fatigue, melena or weight loss. There are no known risk factors. She has tried nothing for the symptoms. The treatment provided mild relief.  Depression       The patient presents with depression.  This is a chronic problem.  The current episode started more than 1 year ago.   The problem occurs intermittently.  The problem has been gradually improving since onset.  Associated symptoms include no decreased concentration, no fatigue, no helplessness, no hopelessness, does not have insomnia, not irritable, no restlessness, no myalgias, no headaches, no indigestion, not sad and no suicidal ideas.     The symptoms are aggravated by nothing.  Past treatments include SSRIs - Selective serotonin reuptake inhibitors.  Compliance with treatment is good.  Previous treatment provided mild relief.  Past medical history includes hypothyroidism, thyroid problem and depression.     Pertinent negatives include no chronic pain, no fibromyalgia, no chronic illness, no recent illness, no terminal illness, no recent psychiatric admission and no Alzheimer's disease. Thyroid Problem  Presents for follow-up visit. Patient reports no anxiety, constipation, depressed mood, diarrhea, fatigue, hair loss, hoarse voice,  palpitations or weight loss. The symptoms have been stable.    No problem-specific Assessment & Plan notes found for this encounter.   Past Medical History:  Diagnosis Date  . Crohn disease ([place])   . Depression   . GERD (gastroesophageal reflux disease)   . Heart murmur   . Hyperlipidemia   . Hypothyroidism   . MS (multiple sclerosis) ([place])   . Neuromuscular disorder ([place])    multiple sclerosis  . Restless leg   . Seizures ([place])   . Tremors of nervous system     Past Surgical History:  Procedure Laterality Date  . CATARACT EXTRACTION W/PHACO Right 10/30/2014   Procedure: CATARACT EXTRACTION PHACO AND INTRAOCULAR LENS PLACEMENT ([place]);  Surgeon: Estill Cotta, MD;  Location: [facility] ORS;  Service: Ophthalmology;  Laterality: Right;  [place]  1:55.7   . CATARACT EXTRACTION W/PHACO Left 06/18/2015   Procedure: CATARACT EXTRACTION PHACO AND INTRAOCULAR LENS PLACEMENT (IOC);  Surgeon: Estill Cotta, MD;  Location: [facility] ORS;  Service: Ophthalmology;  Laterality: Left;  [place] 01:36 AP% 26.1 CDE 42.19 fluid pack lo0t # [id number] H  . COLONOSCOPY  2014   normal- Rein- repeat in 5 years  . EYE SURGERY    . FOOT SURGERY    . NASAL SINUS SURGERY    . TONSILLECTOMY    . TUBAL LIGATION    . VAGINAL HYSTERECTOMY      Family History  Problem Relation Age of Onset  . Cancer Mother     Social History   Social History  . Marital status: Married    Spouse name: N/A  . Number of children: N/A  .  Years of education: N/A   Occupational History  . Not on file.   Social History Main Topics  . Smoking status: Current Every Day Smoker  . Smokeless tobacco: Never Used  . Alcohol use No  . Drug use: No  . Sexual activity: Not Currently   Other Topics Concern  . Not on file   Social History Narrative  . No narrative on file    Allergies  Allergen Reactions  . Codeine      Review of Systems  Constitutional: Negative for chills, fatigue, fever, malaise/fatigue and weight loss.   HENT: Negative for ear discharge, ear pain, hoarse voice and sore throat.   Eyes: Negative for blurred vision.  Respiratory: Negative for cough, sputum production, shortness of breath and wheezing.   Cardiovascular: Negative for chest pain, palpitations and leg swelling.  Gastrointestinal: Negative for abdominal pain, blood in stool, constipation, diarrhea, dysphagia, heartburn, melena and nausea.  Genitourinary: Negative for dysuria, frequency, hematuria and urgency.  Musculoskeletal: Negative for back pain, joint pain, myalgias and neck pain.  Skin: Positive for rash.  Neurological: Negative for dizziness, tingling, sensory change, focal weakness and headaches.  Endo/Heme/Allergies: Negative for environmental allergies and polydipsia. Does not bruise/bleed easily.  Psychiatric/Behavioral: Positive for depression. Negative for decreased concentration and suicidal ideas. The patient is not nervous/anxious and does not have insomnia.      Objective  Vitals:   06/09/16 0804  BP: 110/60  Pulse: 62  Weight: 149 lb (67.6 kg)  Height: 5' 2"  (1.575 m)    Physical Exam  Constitutional: She is well-developed, well-nourished, and in no distress. She is not irritable. No distress.  HENT:  Head: Normocephalic and atraumatic.  Right Ear: Tympanic membrane, external ear and ear canal normal.  Left Ear: Tympanic membrane, external ear and ear canal normal.  Nose: Nose normal.  Mouth/Throat: Uvula is midline and oropharynx is clear and moist. No oropharyngeal exudate, posterior oropharyngeal edema or posterior oropharyngeal erythema.  Eyes: Conjunctivae and EOM are normal. Pupils are equal, round, and reactive to light. Right eye exhibits no discharge. Left eye exhibits no discharge.  Neck: Normal range of motion. Neck supple. No JVD present. No thyromegaly present.  Cardiovascular: Normal rate, regular rhythm, normal heart sounds and intact distal pulses.  Exam reveals no gallop and no friction  rub.   No murmur heard. Pulmonary/Chest: Effort normal and breath sounds normal. She has no wheezes. She has no rales.  Abdominal: Soft. Bowel sounds are normal. She exhibits no mass. There is no tenderness. There is no guarding.  Musculoskeletal: Normal range of motion. She exhibits no edema.  Lymphadenopathy:    She has no cervical adenopathy.  Neurological: She is alert. She has normal reflexes.  Skin: Skin is warm and dry. She is not diaphoretic.  Psychiatric: Mood and affect normal.  Nursing note and vitals reviewed.     Assessment & Plan  Problem List Items Addressed This Visit      Digestive   Acid reflux - Primary   Relevant Medications   pantoprazole (PROTONIX) 40 MG tablet     Endocrine   Adult hypothyroidism   Relevant Medications   levothyroxine (SYNTHROID, LEVOTHROID) 100 MCG tablet   Other Relevant Orders   TSH     Musculoskeletal and Integument   OP (osteoporosis)   Relevant Medications   alendronate (FOSAMAX) 70 MG tablet   Other Relevant Orders   Renal Function Panel     Other   Familial multiple lipoprotein-type hyperlipidemia  Relevant Medications   aspirin 81 MG tablet   Other Relevant Orders   Renal Function Panel   Lipid Profile   Clinical depression   Relevant Medications   traZODone (DESYREL) 100 MG tablet   Restless leg   Relevant Medications   rOPINIRole (REQUIP) 1 MG tablet        Dr. Macon [facility]  06/09/16

## 2016-06-10 DIAGNOSIS — F330 Major depressive disorder, recurrent, mild: Secondary | ICD-10-CM | POA: Diagnosis not present

## 2016-06-10 DIAGNOSIS — F5105 Insomnia due to other mental disorder: Secondary | ICD-10-CM | POA: Diagnosis not present

## 2016-06-10 LAB — RENAL FUNCTION PANEL
Albumin: 4.5 g/dL (ref 3.6–4.8)
BUN / CREAT RATIO: 9 — AB (ref 12–28)
BUN: 7 mg/dL — ABNORMAL LOW (ref 8–27)
CO2: 25 mmol/L (ref 18–29)
CREATININE: 0.79 mg/dL (ref 0.57–1.00)
Calcium: 9.3 mg/dL (ref 8.7–10.3)
Chloride: 103 mmol/L (ref 96–106)
GFR calc Af Amer: 89 mL/min/{1.73_m2} (ref >59)
GFR, EST NON AFRICAN AMERICAN: 77 mL/min/{1.73_m2} (ref >59)
Glucose: 80 mg/dL (ref 65–99)
Phosphorus: 3.4 mg/dL (ref 2.5–4.5)
Potassium: 4.6 mmol/L (ref 3.5–5.2)
SODIUM: 143 mmol/L (ref 134–144)

## 2016-06-10 LAB — LIPID PANEL
CHOL/HDL RATIO: 4.3 ratio (ref 0.0–4.4)
Cholesterol, Total: 207 mg/dL — ABNORMAL HIGH (ref 100–199)
HDL: 48 mg/dL (ref >39)
LDL CALC: 133 mg/dL — AB (ref 0–99)
Triglycerides: 131 mg/dL (ref 0–149)
VLDL Cholesterol Cal: 26 mg/dL (ref 5–40)

## 2016-06-10 LAB — TSH: TSH: 0.662 u[IU]/mL (ref 0.450–4.500)

## 2016-08-19 DIAGNOSIS — H353131 Nonexudative age-related macular degeneration, bilateral, early dry stage: Secondary | ICD-10-CM | POA: Diagnosis not present

## 2016-09-29 DIAGNOSIS — F5105 Insomnia due to other mental disorder: Secondary | ICD-10-CM | POA: Diagnosis not present

## 2016-09-29 DIAGNOSIS — F330 Major depressive disorder, recurrent, mild: Secondary | ICD-10-CM | POA: Diagnosis not present

## 2016-11-10 DIAGNOSIS — F5105 Insomnia due to other mental disorder: Secondary | ICD-10-CM | POA: Diagnosis not present

## 2016-11-10 DIAGNOSIS — F330 Major depressive disorder, recurrent, mild: Secondary | ICD-10-CM | POA: Diagnosis not present

## 2016-12-08 ENCOUNTER — Encounter: Payer: Self-pay | Admitting: Family Medicine

## 2016-12-08 ENCOUNTER — Ambulatory Visit (INDEPENDENT_AMBULATORY_CARE_PROVIDER_SITE_OTHER): Payer: Medicare Other | Admitting: Family Medicine

## 2016-12-08 VITALS — BP 112/62 | HR 60 | Ht 62.0 in | Wt 151.0 lb

## 2016-12-08 DIAGNOSIS — K219 Gastro-esophageal reflux disease without esophagitis: Secondary | ICD-10-CM

## 2016-12-08 DIAGNOSIS — Z1159 Encounter for screening for other viral diseases: Secondary | ICD-10-CM

## 2016-12-08 DIAGNOSIS — M8000XD Age-related osteoporosis with current pathological fracture, unspecified site, subsequent encounter for fracture with routine healing: Secondary | ICD-10-CM | POA: Diagnosis not present

## 2016-12-08 DIAGNOSIS — M8000XS Age-related osteoporosis with current pathological fracture, unspecified site, sequela: Secondary | ICD-10-CM

## 2016-12-08 DIAGNOSIS — J301 Allergic rhinitis due to pollen: Secondary | ICD-10-CM | POA: Insufficient documentation

## 2016-12-08 DIAGNOSIS — Z23 Encounter for immunization: Secondary | ICD-10-CM | POA: Diagnosis not present

## 2016-12-08 DIAGNOSIS — G2581 Restless legs syndrome: Secondary | ICD-10-CM | POA: Diagnosis not present

## 2016-12-08 DIAGNOSIS — E039 Hypothyroidism, unspecified: Secondary | ICD-10-CM

## 2016-12-08 DIAGNOSIS — E559 Vitamin D deficiency, unspecified: Secondary | ICD-10-CM | POA: Diagnosis not present

## 2016-12-08 MED ORDER — ROPINIROLE HCL 1 MG PO TABS: 1.0000 mg | ORAL_TABLET | Freq: Every day | ORAL | 5 refills | Status: DC

## 2016-12-08 MED ORDER — MONTELUKAST SODIUM 10 MG PO TABS: 10.0000 mg | ORAL_TABLET | Freq: Every day | ORAL | 3 refills | Status: DC

## 2016-12-08 MED ORDER — LEVOTHYROXINE SODIUM 100 MCG PO TABS: ORAL_TABLET | ORAL | 5 refills | Status: DC

## 2016-12-08 MED ORDER — PANTOPRAZOLE SODIUM 40 MG PO TBEC: 40.0000 mg | DELAYED_RELEASE_TABLET | Freq: Every day | ORAL | 5 refills | Status: DC

## 2016-12-08 MED ORDER — ALENDRONATE SODIUM 70 MG PO TABS: 70.0000 mg | ORAL_TABLET | ORAL | 5 refills | Status: DC

## 2016-12-08 NOTE — Progress Notes (Signed)
Name: Brooke Juarez   MRN: [id number]    DOB: [birth year]   Date:12/08/2016       Progress Note  Subjective  Chief Complaint  Chief Complaint  Patient presents with  . Osteoporosis  . Hypothyroidism  . Gastroesophageal Reflux  . restless leg    Patient presents for medication refill for restless leg and osteoporosis.   Gastroesophageal Reflux  She reports no abdominal pain, no belching, no chest pain, no choking, no coughing, no dysphagia, no early satiety, no globus sensation, no heartburn, no hoarse voice, no nausea, no sore throat, no stridor or no wheezing. This is a chronic problem. The current episode started more than 1 year ago. The problem occurs occasionally. The problem has been waxing and waning. The symptoms are aggravated by certain foods. Pertinent negatives include no fatigue, melena or weight loss. There are no known risk factors. She has tried a PPI for the symptoms. The treatment provided moderate relief. Past procedures do not include an abdominal ultrasound, an EGD, esophageal manometry, esophageal pH monitoring, H. pylori antibody titer or a UGI.  Thyroid Problem  Presents for follow-up visit. Patient reports no anxiety, cold intolerance, constipation, depressed mood, diaphoresis, diarrhea, dry skin, fatigue, hair loss, heat intolerance, hoarse voice, leg swelling, nail problem, palpitations, tremors, visual change, weight gain or weight loss. The symptoms have been stable.    No problem-specific Assessment & Plan notes found for this encounter.   Past Medical History:  Diagnosis Date  . Crohn disease ([place])   . Depression   . GERD (gastroesophageal reflux disease)   . Heart murmur   . Hyperlipidemia   . Hypothyroidism   . MS (multiple sclerosis) ([place])   . Neuromuscular disorder ([place])    multiple sclerosis  . Restless leg   . Seizures ([place])   . Tremors of nervous system     Past Surgical History:  Procedure Laterality Date  . CATARACT EXTRACTION W/PHACO  Right 10/30/2014   Procedure: CATARACT EXTRACTION PHACO AND INTRAOCULAR LENS PLACEMENT ([place]);  Surgeon: Estill Cotta, MD;  Location: [facility] ORS;  Service: Ophthalmology;  Laterality: Right;  [place]  1:55.7   . CATARACT EXTRACTION W/PHACO Left 06/18/2015   Procedure: CATARACT EXTRACTION PHACO AND INTRAOCULAR LENS PLACEMENT ([facility]);  Surgeon: Estill Cotta, MD;  Location: [facility];  Service: Ophthalmology;  Laterality: Left;  [place] 01:36 AP% 26.1 CDE 42.19 fluid pack lo0t # [id number] H  . COLONOSCOPY  2014   normal- Rein- repeat in 5 years  . EYE SURGERY    . FOOT SURGERY    . NASAL SINUS SURGERY    . TONSILLECTOMY    . TUBAL LIGATION    . VAGINAL HYSTERECTOMY      Family History  Problem Relation Age of Onset  . Cancer Mother     Social History   Social History  . Marital status: Married    Spouse name: N/A  . Number of children: N/A  . Years of education: N/A   Occupational History  . Not on file.   Social History Main Topics  . Smoking status: Current Every Day Smoker  . Smokeless tobacco: Never Used  . Alcohol use No  . Drug use: No  . Sexual activity: Not Currently   Other Topics Concern  . Not on file   Social History Narrative  . No narrative on file    Allergies  Allergen Reactions  . Codeine     Outpatient Medications Prior to Visit  Medication Sig Dispense Refill  .  aspirin 81 MG tablet Take 1 tablet (81 mg total) by mouth daily. 30 tablet 11  . buPROPion (WELLBUTRIN SR) 150 MG 12 hr tablet Take 1 tablet by mouth 2 (two) times daily.    . Cholecalciferol (VITAMIN D) 2000 UNITS CAPS Take 1 capsule (2,000 Units total) by mouth daily. 30 capsule   . gabapentin (NEURONTIN) 600 MG tablet Take 300 mg by mouth. Dr [name]    . OMEGA 3-6-9 FATTY ACIDS PO Take by mouth.    . traZODone (DESYREL) 100 MG tablet Take 1 tablet (100 mg total) by mouth daily. 30 tablet 5  . vitamin B-12 (CYANOCOBALAMIN) 500 MCG tablet Take 500 mcg by mouth daily.    [name] alendronate (FOSAMAX)  70 MG tablet Take 1 tablet (70 mg total) by mouth once a week. Take with a full glass of water on an empty stomach. 30 tablet 5  . levothyroxine (SYNTHROID, LEVOTHROID) 100 MCG tablet TAKE ONE TABLET BY MOUTH ONCE DAILY. NEEDS APPOINTMENT 30 tablet 5  . pantoprazole (PROTONIX) 40 MG tablet Take 1 tablet (40 mg total) by mouth daily. 30 tablet 5  . rOPINIRole (REQUIP) 1 MG tablet Take 1 tablet (1 mg total) by mouth daily. 30 tablet 5   No facility-administered medications prior to visit.     Review of Systems  Constitutional: Negative for chills, diaphoresis, fatigue, fever, malaise/fatigue, weight gain and weight loss.  HENT: Negative for ear discharge, ear pain, hoarse voice and sore throat.   Eyes: Negative for blurred vision.  Respiratory: Negative for cough, sputum production, choking, shortness of breath and wheezing.   Cardiovascular: Negative for chest pain, palpitations and leg swelling.  Gastrointestinal: Negative for abdominal pain, blood in stool, constipation, diarrhea, dysphagia, heartburn, melena and nausea.  Genitourinary: Negative for dysuria, frequency, hematuria and urgency.  Musculoskeletal: Negative for back pain, joint pain, myalgias and neck pain.  Skin: Negative for rash.  Neurological: Negative for dizziness, tingling, tremors, sensory change, focal weakness and headaches.  Endo/Heme/Allergies: Negative for environmental allergies, cold intolerance, heat intolerance and polydipsia. Does not bruise/bleed easily.  Psychiatric/Behavioral: Negative for depression and suicidal ideas. The patient is not nervous/anxious and does not have insomnia.      Objective  Vitals:   12/08/16 0801  BP: 112/62  Pulse: 60  Weight: 151 lb (68.5 kg)  Height: 5' 2"  (1.575 m)    Physical Exam  Constitutional: She is well-developed, well-nourished, and in no distress. No distress.  HENT:  Head: Normocephalic and atraumatic.  Right Ear: External ear normal.  Left Ear: External  ear normal.  Nose: Nose normal.  Mouth/Throat: Oropharynx is clear and moist.  Eyes: Pupils are equal, round, and reactive to light. Conjunctivae and EOM are normal. Right eye exhibits no discharge. Left eye exhibits no discharge.  Neck: Normal range of motion. Neck supple. No JVD present. No thyromegaly present.  Cardiovascular: Normal rate, regular rhythm, normal heart sounds and intact distal pulses.  Exam reveals no gallop and no friction rub.   No murmur heard. Pulmonary/Chest: Effort normal and breath sounds normal. She has no wheezes. She has no rales.  Abdominal: Soft. Bowel sounds are normal. She exhibits no mass. There is no tenderness. There is no guarding.  Musculoskeletal: Normal range of motion. She exhibits no edema.  Lymphadenopathy:    She has no cervical adenopathy.  Neurological: She is alert. She has normal reflexes.  Skin: Skin is warm and dry. She is not diaphoretic.  Psychiatric: Mood and affect normal.  Nursing note  and vitals reviewed.     Assessment & Plan  Problem List Items Addressed This Visit      Respiratory   Seasonal allergic rhinitis due to pollen   Relevant Medications   montelukast (SINGULAIR) 10 MG tablet     Digestive   Acid reflux - Primary   Relevant Medications   pantoprazole (PROTONIX) 40 MG tablet     Endocrine   Adult hypothyroidism   Relevant Medications   levothyroxine (SYNTHROID, LEVOTHROID) 100 MCG tablet   Other Relevant Orders   TSH     Musculoskeletal and Integument   OP (osteoporosis)   Relevant Medications   alendronate (FOSAMAX) 70 MG tablet     Other   Restless leg   Relevant Medications   rOPINIRole (REQUIP) 1 MG tablet   Vitamin D deficiency    Other Visit Diagnoses    Need for hepatitis C screening test       Relevant Orders   Hepatitis C antibody   Need for pneumococcal vaccine       Relevant Orders   Pneumococcal polysaccharide vaccine 23-valent greater than or equal to 2yo subcutaneous/IM       Meds ordered this encounter  Medications  . rOPINIRole (REQUIP) 1 MG tablet    Sig: Take 1 tablet (1 mg total) by mouth daily.    Dispense:  30 tablet    Refill:  5  . pantoprazole (PROTONIX) 40 MG tablet    Sig: Take 1 tablet (40 mg total) by mouth daily.    Dispense:  30 tablet    Refill:  5  . alendronate (FOSAMAX) 70 MG tablet    Sig: Take 1 tablet (70 mg total) by mouth once a week. Take with a full glass of water on an empty stomach.    Dispense:  30 tablet    Refill:  5  . levothyroxine (SYNTHROID, LEVOTHROID) 100 MCG tablet    Sig: TAKE ONE TABLET BY MOUTH ONCE DAILY. NEEDS APPOINTMENT    Dispense:  30 tablet    Refill:  5  . montelukast (SINGULAIR) 10 MG tablet    Sig: Take 1 tablet (10 mg total) by mouth at bedtime.    Dispense:  30 tablet    Refill:  3      Dr. [facility]  12/08/16

## 2016-12-09 LAB — HEPATITIS C ANTIBODY

## 2016-12-09 LAB — TSH: TSH: 0.749 u[IU]/mL (ref 0.450–4.500)

## 2016-12-15 ENCOUNTER — Other Ambulatory Visit: Payer: Self-pay

## 2016-12-26 DIAGNOSIS — G44099 Other trigeminal autonomic cephalgias (TAC), not intractable: Secondary | ICD-10-CM | POA: Diagnosis not present

## 2016-12-26 DIAGNOSIS — R251 Tremor, unspecified: Secondary | ICD-10-CM | POA: Diagnosis not present

## 2016-12-26 DIAGNOSIS — G609 Hereditary and idiopathic neuropathy, unspecified: Secondary | ICD-10-CM | POA: Diagnosis not present

## 2017-02-05 DIAGNOSIS — R569 Unspecified convulsions: Secondary | ICD-10-CM | POA: Diagnosis not present

## 2017-02-20 DIAGNOSIS — R569 Unspecified convulsions: Secondary | ICD-10-CM | POA: Diagnosis not present

## 2017-02-21 DIAGNOSIS — R569 Unspecified convulsions: Secondary | ICD-10-CM | POA: Diagnosis not present

## 2017-02-22 DIAGNOSIS — R569 Unspecified convulsions: Secondary | ICD-10-CM | POA: Diagnosis not present

## 2017-03-02 DIAGNOSIS — F330 Major depressive disorder, recurrent, mild: Secondary | ICD-10-CM | POA: Diagnosis not present

## 2017-03-02 DIAGNOSIS — F5105 Insomnia due to other mental disorder: Secondary | ICD-10-CM | POA: Diagnosis not present

## 2017-04-14 DIAGNOSIS — K219 Gastro-esophageal reflux disease without esophagitis: Secondary | ICD-10-CM | POA: Diagnosis not present

## 2017-04-14 DIAGNOSIS — R1312 Dysphagia, oropharyngeal phase: Secondary | ICD-10-CM | POA: Diagnosis not present

## 2017-04-14 DIAGNOSIS — Z800 Family history of malignant neoplasm of digestive organs: Secondary | ICD-10-CM | POA: Diagnosis not present

## 2017-04-14 DIAGNOSIS — K52831 Collagenous colitis: Secondary | ICD-10-CM | POA: Diagnosis not present

## 2017-04-14 DIAGNOSIS — R0981 Nasal congestion: Secondary | ICD-10-CM | POA: Diagnosis not present

## 2017-04-16 ENCOUNTER — Other Ambulatory Visit: Payer: Self-pay | Admitting: Family Medicine

## 2017-04-16 DIAGNOSIS — J301 Allergic rhinitis due to pollen: Secondary | ICD-10-CM

## 2017-05-25 ENCOUNTER — Encounter: Payer: Medicare Other | Admitting: Family Medicine

## 2017-06-10 DIAGNOSIS — R197 Diarrhea, unspecified: Secondary | ICD-10-CM | POA: Diagnosis not present

## 2017-06-11 ENCOUNTER — Other Ambulatory Visit
Admission: RE | Admit: 2017-06-11 | Discharge: 2017-06-11 | Disposition: A | Discharge status: Home / Self Care | Payer: Medicare Other | Source: Ambulatory Visit | Attending: Student | Admitting: Student

## 2017-06-11 DIAGNOSIS — R197 Diarrhea, unspecified: Secondary | ICD-10-CM | POA: Insufficient documentation

## 2017-06-11 DIAGNOSIS — R109 Unspecified abdominal pain: Secondary | ICD-10-CM | POA: Diagnosis not present

## 2017-06-11 LAB — GASTROINTESTINAL PANEL BY PCR, STOOL (REPLACES STOOL CULTURE)
ASTROVIRUS: NOT DETECTED
Adenovirus F40/41: NOT DETECTED
Campylobacter species: NOT DETECTED
Cryptosporidium: NOT DETECTED
Cyclospora cayetanensis: NOT DETECTED
ENTAMOEBA HISTOLYTICA: NOT DETECTED
ENTEROAGGREGATIVE E COLI (EAEC): NOT DETECTED
Enteropathogenic E coli (EPEC): NOT DETECTED
Enterotoxigenic E coli (ETEC): NOT DETECTED
GIARDIA LAMBLIA: NOT DETECTED
NOROVIRUS GI/GII: NOT DETECTED
Plesimonas shigelloides: NOT DETECTED
Rotavirus A: NOT DETECTED
SALMONELLA SPECIES: NOT DETECTED
SHIGELLA/ENTEROINVASIVE E COLI (EIEC): NOT DETECTED
Sapovirus (I, II, IV, and V): NOT DETECTED
Shiga like toxin producing E coli (STEC): NOT DETECTED
VIBRIO CHOLERAE: NOT DETECTED
Vibrio species: NOT DETECTED
Yersinia enterocolitica: NOT DETECTED

## 2017-06-11 LAB — C DIFFICILE QUICK SCREEN W PCR REFLEX
C DIFFICILE (CDIFF) TOXIN: NEGATIVE
C DIFFICLE (CDIFF) ANTIGEN: NEGATIVE
C Diff interpretation: NOT DETECTED

## 2017-06-16 ENCOUNTER — Encounter: Payer: Self-pay | Admitting: Family Medicine

## 2017-06-16 ENCOUNTER — Ambulatory Visit (INDEPENDENT_AMBULATORY_CARE_PROVIDER_SITE_OTHER): Payer: Medicare Other | Admitting: Family Medicine

## 2017-06-16 VITALS — BP 122/70 | HR 60 | Ht 62.0 in | Wt 153.0 lb

## 2017-06-16 DIAGNOSIS — M8000XS Age-related osteoporosis with current pathological fracture, unspecified site, sequela: Secondary | ICD-10-CM

## 2017-06-16 DIAGNOSIS — S39012A Strain of muscle, fascia and tendon of lower back, initial encounter: Secondary | ICD-10-CM | POA: Diagnosis not present

## 2017-06-16 DIAGNOSIS — G2581 Restless legs syndrome: Secondary | ICD-10-CM | POA: Diagnosis not present

## 2017-06-16 DIAGNOSIS — Z0000 Encounter for general adult medical examination without abnormal findings: Secondary | ICD-10-CM | POA: Diagnosis not present

## 2017-06-16 DIAGNOSIS — N6012 Diffuse cystic mastopathy of left breast: Secondary | ICD-10-CM | POA: Diagnosis not present

## 2017-06-16 DIAGNOSIS — E782 Mixed hyperlipidemia: Secondary | ICD-10-CM

## 2017-06-16 DIAGNOSIS — N6011 Diffuse cystic mastopathy of right breast: Secondary | ICD-10-CM

## 2017-06-16 DIAGNOSIS — E039 Hypothyroidism, unspecified: Secondary | ICD-10-CM | POA: Diagnosis not present

## 2017-06-16 DIAGNOSIS — Z1239 Encounter for other screening for malignant neoplasm of breast: Secondary | ICD-10-CM

## 2017-06-16 DIAGNOSIS — Z1231 Encounter for screening mammogram for malignant neoplasm of breast: Secondary | ICD-10-CM

## 2017-06-16 DIAGNOSIS — Z23 Encounter for immunization: Secondary | ICD-10-CM

## 2017-06-16 MED ORDER — ROPINIROLE HCL 1 MG PO TABS
1.0000 mg | ORAL_TABLET | Freq: Every day | ORAL | 1 refills | Status: DC
Start: 2017-06-16 — End: 2017-11-25

## 2017-06-16 MED ORDER — ALENDRONATE SODIUM 70 MG PO TABS: 70.0000 mg | ORAL_TABLET | ORAL | 1 refills | Status: DC

## 2017-06-16 MED ORDER — LEVOTHYROXINE SODIUM 100 MCG PO TABS: ORAL_TABLET | ORAL | 1 refills | Status: DC

## 2017-06-16 NOTE — Patient Instructions (Signed)

## 2017-06-16 NOTE — Progress Notes (Signed)
Name: Brooke Juarez   MRN: [id number]    DOB: July 21, 1950   Date:06/16/2017       Progress Note  Subjective  Chief Complaint  Chief Complaint  Patient presents with  . Annual Exam    no pap  . Hypothyroidism  . Osteoporosis  . restless leg    Patient presents for annual physical exam.   Thyroid Problem  Presents for follow-up visit. Patient reports no anxiety, cold intolerance, constipation, depressed mood, diaphoresis, diarrhea, dry skin, fatigue, hair loss, heat intolerance, hoarse voice, leg swelling, menstrual problem, nail problem, palpitations, tremors, visual change, weight gain or weight loss. The symptoms have been stable. Her past medical history is significant for hyperlipidemia. There is no history of diabetes.  Gastroesophageal Reflux  She reports no abdominal pain, no belching, no chest pain, no choking, no coughing, no dysphagia, no early satiety, no globus sensation, no heartburn, no hoarse voice, no nausea, no sore throat, no stridor, no tooth decay, no water brash or no wheezing. The symptoms are aggravated by certain foods. Pertinent negatives include no fatigue, melena or weight loss. She has tried a PPI for the symptoms. The treatment provided moderate relief.  Hyperlipidemia  This is a chronic problem. The current episode started more than 1 year ago. The problem is controlled. Recent lipid tests were reviewed and are normal. Exacerbating diseases include hypothyroidism. She has no history of chronic renal disease, diabetes, liver disease, obesity or nephrotic syndrome. Factors aggravating her hyperlipidemia include thiazides. Pertinent negatives include no chest pain, focal weakness, leg pain, myalgias or shortness of breath. Treatments tried: omega. The current treatment provides moderate improvement of lipids. There are no compliance problems.  Risk factors for coronary artery disease include post-menopausal.  Back Pain  This is a new problem. The current episode  started in the past 7 days (2 days ago). The problem occurs intermittently. The problem has been resolved since onset. The pain is present in the lumbar spine. The pain does not radiate. The pain is at a severity of 3/10. The pain is mild. Associated symptoms include weakness. Pertinent negatives include no abdominal pain, bladder incontinence, bowel incontinence, chest pain, dysuria, fever, headaches, leg pain, paresis, paresthesias, tingling or weight loss.    No problem-specific Assessment & Plan notes found for this encounter.   Past Medical History:  Diagnosis Date  . Crohn disease ([place])   . Depression   . GERD (gastroesophageal reflux disease)   . Heart murmur   . Hyperlipidemia   . Hypothyroidism   . MS (multiple sclerosis) ([place])   . Neuromuscular disorder ([place])    multiple sclerosis  . Restless leg   . Seizures ([place])   . Tremors of nervous system     Past Surgical History:  Procedure Laterality Date  . CATARACT EXTRACTION W/PHACO Right 10/30/2014   Procedure: CATARACT EXTRACTION PHACO AND INTRAOCULAR LENS PLACEMENT ([place]);  Surgeon: Estill Cotta, MD;  Location: [facility];  Service: Ophthalmology;  Laterality: Right;  [place]  1:55.7   . CATARACT EXTRACTION W/PHACO Left 06/18/2015   Procedure: CATARACT EXTRACTION PHACO AND INTRAOCULAR LENS PLACEMENT ([facility]);  Surgeon: Estill Cotta, MD;  Location: [facility];  Service: Ophthalmology;  Laterality: Left;  [place] 01:36 AP% 26.1 CDE 42.19 fluid pack lo0t # [id number] H  . COLONOSCOPY  2014   normal- Rein- repeat in 5 years  . EYE SURGERY    . FOOT SURGERY    . NASAL SINUS SURGERY    . TONSILLECTOMY    . TUBAL  LIGATION    . VAGINAL HYSTERECTOMY      Family History  Problem Relation Age of Onset  . Cancer Mother     Social History   Socioeconomic History  . Marital status: Married    Spouse name: Not on file  . Number of children: Not on file  . Years of education: Not on file  . Highest education level: Not on file   Social Needs  . Financial resource strain: Not on file  . Food insecurity - worry: Not on file  . Food insecurity - inability: Not on file  . Transportation needs - medical: Not on file  . Transportation needs - non-medical: Not on file  Occupational History  . Not on file  Tobacco Use  . Smoking status: Current Every Day Smoker  . Smokeless tobacco: Never Used  Substance and Sexual Activity  . Alcohol use: No    Alcohol/week: 0.0 oz  . Drug use: No  . Sexual activity: Not Currently  Other Topics Concern  . Not on file  Social History Narrative  . Not on file    Allergies  Allergen Reactions  . Codeine     Outpatient Medications Prior to Visit  Medication Sig Dispense Refill  . aspirin 81 MG tablet Take 1 tablet (81 mg total) by mouth daily. 30 tablet 11  . buPROPion (WELLBUTRIN SR) 150 MG 12 hr tablet Take 1 tablet by mouth 2 (two) times daily.    . Cholecalciferol (VITAMIN D) 2000 UNITS CAPS Take 1 capsule (2,000 Units total) by mouth daily. 30 capsule   . gabapentin (NEURONTIN) 600 MG tablet Take 600 mg by mouth 2 (two) times daily. Dr [name]    . montelukast (SINGULAIR) 10 MG tablet TAKE 1 TABLET BY MOUTH AT BEDTIME 30 tablet 3  . OMEGA 3-6-9 FATTY ACIDS PO Take by mouth.    . pantoprazole (PROTONIX) 40 MG tablet Take 1 tablet (40 mg total) by mouth daily. 30 tablet 5  . potassium chloride (K-DUR) 10 MEQ tablet Take 1 tablet by mouth daily.    . traZODone (DESYREL) 100 MG tablet Take 1 tablet (100 mg total) by mouth daily. 30 tablet 5  . vitamin B-12 (CYANOCOBALAMIN) 500 MCG tablet Take 500 mcg by mouth daily.    [name] alendronate (FOSAMAX) 70 MG tablet Take 1 tablet (70 mg total) by mouth once a week. Take with a full glass of water on an empty stomach. 30 tablet 5  . levothyroxine (SYNTHROID, LEVOTHROID) 100 MCG tablet TAKE ONE TABLET BY MOUTH ONCE DAILY. NEEDS APPOINTMENT 30 tablet 5  . rOPINIRole (REQUIP) 1 MG tablet Take 1 tablet (1 mg total) by mouth daily. 30 tablet 5    No facility-administered medications prior to visit.     Review of Systems  Constitutional: Negative for chills, diaphoresis, fatigue, fever, malaise/fatigue, weight gain and weight loss.  HENT: Negative for ear discharge, ear pain, hoarse voice and sore throat.   Eyes: Negative for blurred vision.  Respiratory: Negative for cough, sputum production, choking, shortness of breath and wheezing.   Cardiovascular: Negative for chest pain, palpitations and leg swelling.  Gastrointestinal: Negative for abdominal pain, blood in stool, bowel incontinence, constipation, diarrhea, dysphagia, heartburn, melena and nausea.  Genitourinary: Negative for bladder incontinence, dysuria, frequency, hematuria, menstrual problem and urgency.  Musculoskeletal: Positive for back pain. Negative for joint pain, myalgias and neck pain.  Skin: Negative for rash.  Neurological: Positive for weakness. Negative for dizziness, tingling, tremors, sensory change, focal weakness,  headaches and paresthesias.  Endo/Heme/Allergies: Negative for environmental allergies, cold intolerance, heat intolerance and polydipsia. Does not bruise/bleed easily.  Psychiatric/Behavioral: Negative for depression and suicidal ideas. The patient is not nervous/anxious and does not have insomnia.      Objective  Vitals:   06/16/17 0821  BP: 122/70  Pulse: 60  Weight: 153 lb (69.4 kg)  Height: 5' 2"  (1.575 m)    Physical Exam  Constitutional: She is oriented to person, place, and time and well-developed, well-nourished, and in no distress. Vital signs are normal. No distress.  HENT:  Head: Normocephalic and atraumatic.  Right Ear: Tympanic membrane, external ear and ear canal normal.  Left Ear: Tympanic membrane, external ear and ear canal normal.  Nose: Nose normal.  Mouth/Throat: Oropharynx is clear and moist and mucous membranes are normal. No oropharyngeal exudate, posterior oropharyngeal edema, posterior oropharyngeal erythema  or tonsillar abscesses.  Eyes: Conjunctivae and EOM are normal. Pupils are equal, round, and reactive to light. Right eye exhibits no discharge. Left eye exhibits no discharge.  Fundoscopic exam:      The right eye shows no arteriolar narrowing.       The left eye shows no arteriolar narrowing.  Neck: Normal range of motion. Neck supple. Normal carotid pulses, no hepatojugular reflux and no JVD present. Carotid bruit is not present. No thyromegaly present.  Cardiovascular: Normal rate, regular rhythm, S1 normal, S2 normal, normal heart sounds and intact distal pulses. PMI is not displaced. Exam reveals no gallop, no S3 and no friction rub.  No murmur heard. Pulmonary/Chest: Effort normal and breath sounds normal. She has no wheezes. She has no rales. Right breast exhibits no inverted nipple, no mass, no nipple discharge, no skin change and no tenderness. Left breast exhibits no inverted nipple, no mass, no nipple discharge, no skin change and no tenderness. Breasts are symmetrical.  Abdominal: Soft. Bowel sounds are normal. She exhibits no mass. There is no hepatosplenomegaly. There is no tenderness. There is no guarding and no CVA tenderness.  Musculoskeletal: Normal range of motion. She exhibits no edema.       Lumbar back: She exhibits spasm.  Lymphadenopathy:       Head (right side): No submandibular adenopathy present.       Head (left side): No submandibular adenopathy present.    She has no cervical adenopathy.  Neurological: She is alert and oriented to person, place, and time. She has normal sensation, normal reflexes and intact cranial nerves.  Skin: Skin is warm, dry and intact. She is not diaphoretic.  Psychiatric: Mood and affect normal.  Nursing note and vitals reviewed.     Assessment & Plan  Problem List Items Addressed This Visit      Endocrine   Adult hypothyroidism   Relevant Medications   levothyroxine (SYNTHROID, LEVOTHROID) 100 MCG tablet   Other Relevant Orders    TSH     Musculoskeletal and Integument   OP (osteoporosis)   Relevant Medications   alendronate (FOSAMAX) 70 MG tablet     Other   Restless leg   Relevant Medications   rOPINIRole (REQUIP) 1 MG tablet    Other Visit Diagnoses    Annual physical exam    -  Primary   Mixed hyperlipidemia       Relevant Orders   Lipid panel   Strain of lumbar region, initial encounter       prn tylenol/NEW   Influenza vaccine needed       Relevant Orders  Flu Vaccine QUAD 6+ mos PF IM (Fluarix Quad PF) (Completed)   Breast cancer screening       Breast cancer screening by mammogram       Relevant Orders   MM Digital Screening   Fibrocystic breast changes, bilateral          Meds ordered this encounter  Medications  . alendronate (FOSAMAX) 70 MG tablet    Sig: Take 1 tablet (70 mg total) by mouth once a week. Take with a full glass of water on an empty stomach.    Dispense:  12 tablet    Refill:  1  . levothyroxine (SYNTHROID, LEVOTHROID) 100 MCG tablet    Sig: TAKE ONE TABLET BY MOUTH ONCE DAILY. NEEDS APPOINTMENT    Dispense:  90 tablet    Refill:  1  . rOPINIRole (REQUIP) 1 MG tablet    Sig: Take 1 tablet (1 mg total) by mouth daily.    Dispense:  90 tablet    Refill:  1  Immunizations are reviewed and recommendations provided.   Age appropriate screening tests are discussed. Counseling given for risk factor reduction intervention. QUINETTE HENTGES is a 67 y.o. female who presents today for her Complete Annual Exam. She feels well. She reports exercising as can. She reports she is sleeping well.   Patient has been advised of the health risks of smoking and counseled concerning cessation of tobacco products. I spent over 3 minutes for discussion and to answer questions.  Health risks of being over weight were discussed and patient was counseled on weight loss options and exercise.     Dr. [facility]  06/16/17

## 2017-06-17 ENCOUNTER — Other Ambulatory Visit: Payer: Self-pay | Admitting: Family Medicine

## 2017-06-17 DIAGNOSIS — K219 Gastro-esophageal reflux disease without esophagitis: Secondary | ICD-10-CM

## 2017-06-17 LAB — LIPID PANEL
CHOL/HDL RATIO: 3.6 ratio (ref 0.0–4.4)
Cholesterol, Total: 185 mg/dL (ref 100–199)
HDL: 51 mg/dL (ref >39)
LDL Calculated: 117 mg/dL — ABNORMAL HIGH (ref 0–99)
TRIGLYCERIDES: 86 mg/dL (ref 0–149)
VLDL Cholesterol Cal: 17 mg/dL (ref 5–40)

## 2017-06-17 LAB — TSH: TSH: 0.991 u[IU]/mL (ref 0.450–4.500)

## 2017-06-18 DIAGNOSIS — E876 Hypokalemia: Secondary | ICD-10-CM | POA: Diagnosis not present

## 2017-07-16 ENCOUNTER — Ambulatory Visit (INDEPENDENT_AMBULATORY_CARE_PROVIDER_SITE_OTHER): Payer: Medicare Other

## 2017-07-16 VITALS — BP 110/70 | HR 76 | Temp 98.7°F | Resp 12 | Ht 62.0 in | Wt 146.4 lb

## 2017-07-16 DIAGNOSIS — Z0000 Encounter for general adult medical examination without abnormal findings: Secondary | ICD-10-CM

## 2017-07-16 NOTE — Progress Notes (Signed)
Subjective:   Brooke Juarez is a 67 y.o. female who presents for Medicare Annual (Subsequent) preventive examination.  Review of Systems:  N/A Cardiac Risk Factors include: advanced age (>71mn, >>54women);smoking/ tobacco exposure;Other (see comment), Risk factor comments: MS     Objective:     Vitals: BP 110/70 (BP Location: Right Arm, Patient Position: Sitting, Cuff Size: Normal)   Pulse 76   Temp 98.7 F (37.1 C) (Oral)   Resp 12   Ht 5' 2"  (1.575 m)   Wt 146 lb 6.4 oz (66.4 kg)   LMP  (LMP Unknown)   BMI 26.78 kg/m   Body mass index is 26.78 kg/m.  Advanced Directives 07/16/2017 11/27/2014  Does Patient Have a Medical Advance Directive? No No  Would patient like information on creating a medical advance directive? Yes (MAU/Ambulatory/Procedural Areas - Information given) No - patient declined information    Tobacco Social History   Tobacco Use  Smoking Status Current Every Day Smoker  . Packs/day: 0.50  . Years: 55.00  . Pack years: 27.50  . Types: Cigarettes  Smokeless Tobacco Never Used  Tobacco Comment   takes Wellbutrin; continuing to reduce # of cigarettes smoked; Referred to SBurgess Estelle RN     Ready to quit: Yes Counseling given: Yes Comment: takes Wellbutrin; continuing to reduce # of cigarettes smoked; Referred to SBurgess Estelle RN  Clinical Intake:  Pre-visit preparation completed: Yes  Pain : No/denies pain   BMI - recorded: 26.78 Nutritional Status: BMI 25 -29 Overweight Nutritional Risks: None Diabetes: No  How often do you need to have someone help you when you read instructions, pamphlets, or other written materials from your doctor or pharmacy?: 1 - Never  Interpreter Needed?: No  Information entered by :: AIdell Pickles LPN  Past Medical History:  Diagnosis Date  . Crohn disease ([place]   . Depression   . GERD (gastroesophageal reflux disease)   . Heart murmur   . Hyperlipidemia   . Hypothyroidism   . MS (multiple sclerosis)  ([place]   . Neuromuscular disorder ([place]    multiple sclerosis  . Restless leg   . Seizures ([place]   . Tremors of nervous system    Past Surgical History:  Procedure Laterality Date  . CATARACT EXTRACTION W/PHACO Right 10/30/2014   Procedure: CATARACT EXTRACTION PHACO AND INTRAOCULAR LENS PLACEMENT ([place];  Surgeon: SEstill Cotta MD;  Location: [facility] ORS;  Service: Ophthalmology;  Laterality: Right;  [place] 1:55.7   . CATARACT EXTRACTION W/PHACO Left 06/18/2015   Procedure: CATARACT EXTRACTION PHACO AND INTRAOCULAR LENS PLACEMENT (IOC);  Surgeon: SEstill Cotta MD;  Location: [facility] ORS;  Service: Ophthalmology;  Laterality: Left;  [place]01:36 AP% 26.1 CDE 42.19 fluid pack lo0t # [id number]  . COLONOSCOPY  2014   normal- Rein- repeat in 5 years  . EYE SURGERY    . FOOT SURGERY    . NASAL SINUS SURGERY    . TONSILLECTOMY    . TUBAL LIGATION    . VAGINAL HYSTERECTOMY     Family History  Problem Relation Age of Onset  . Cancer Mother   . Heart disease Mother   . Stroke Mother   . Stroke Father    Social History   Socioeconomic History  . Marital status: Married    Spouse name: None  . Number of children: 3  . Years of education: some college  . Highest education level: 12th grade  Social Needs  . Financial resource strain: Not hard at  all  . Food insecurity - worry: Never true  . Food insecurity - inability: Never true  . Transportation needs - medical: No  . Transportation needs - non-medical: No  Occupational History  . Occupation: Retired  Tobacco Use  . Smoking status: Current Every Day Smoker    Packs/day: 0.50    Years: 55.00    Pack years: 27.50    Types: Cigarettes  . Smokeless tobacco: Never Used  . Tobacco comment: takes Wellbutrin; continuing to reduce # of cigarettes smoked; Referred to Burgess Estelle, RN  Substance and Sexual Activity  . Alcohol use: No    Alcohol/week: 0.0 oz  . Drug use: No  . Sexual activity: Not Currently  Other Topics Concern  .  None  Social History Narrative  . None    Outpatient Encounter Medications as of 07/16/2017  Medication Sig  . alendronate (FOSAMAX) 70 MG tablet Take 1 tablet (70 mg total) by mouth once a week. Take with a full glass of water on an empty stomach.  [name] aspirin 81 MG tablet Take 1 tablet (81 mg total) by mouth daily.  [name] buPROPion (WELLBUTRIN SR) 150 MG 12 hr tablet Take 1 tablet by mouth 2 (two) times daily.  . Cholecalciferol (VITAMIN D) 2000 UNITS CAPS Take 1 capsule (2,000 Units total) by mouth daily.  [name] gabapentin (NEURONTIN) 600 MG tablet Take 600 mg by mouth 2 (two) times daily. Dr [name]  . levothyroxine (SYNTHROID, LEVOTHROID) 100 MCG tablet TAKE ONE TABLET BY MOUTH ONCE DAILY. NEEDS APPOINTMENT  . montelukast (SINGULAIR) 10 MG tablet TAKE 1 TABLET BY MOUTH AT BEDTIME  . OMEGA 3-6-9 FATTY ACIDS PO Take by mouth.  . pantoprazole (PROTONIX) 40 MG tablet TAKE 1 TABLET BY MOUTH ONCE DAILY  . potassium chloride (K-DUR) 10 MEQ tablet Take 1 tablet by mouth daily.  [name] rOPINIRole (REQUIP) 1 MG tablet Take 1 tablet (1 mg total) by mouth daily.  . traZODone (DESYREL) 100 MG tablet Take 1 tablet (100 mg total) by mouth daily.  . vitamin B-12 (CYANOCOBALAMIN) 500 MCG tablet Take 500 mcg by mouth daily.   No facility-administered encounter medications on file as of 07/16/2017.     Activities of Daily Living In your present state of health, do you have any difficulty performing the following activities: 07/16/2017  Hearing? Y  Comment tinnitus; denies hearing aids  Vision? N  Comment wears reading glasses  Difficulty concentrating or making decisions? Y  Walking or climbing stairs? Y  Comment avoids d/t MS  Dressing or bathing? Y  Comment assitance from husband  Doing errands, shopping? Y  Comment husband transports to [employer] and eating ? N  Comment wears upper partial  Using the Toilet? N  In the past six months, have you accidently leaked urine? N  Do you have problems  with loss of bowel control? Y  Comment wears depends; Crohn's disease  Managing your Medications? Y  Comment husband manages medications  Managing your Finances? N  Housekeeping or managing your Housekeeping? Y  Comment sister in-law cleans home  Some recent data might be hidden    Patient Care Team: Juline Patch, MD as PCP - General (Family Medicine) Vladimir Crofts, MD as Consulting Physician (Neurology) Chauncey Mann, MD as Consulting Physician (Psychiatry)    Assessment:   This is a routine wellness examination for Chloeanne.  Exercise Activities and Dietary recommendations Current Exercise Habits: Home exercise routine, Type of exercise: strength training/weights, Time (Minutes): 20,  Frequency (Times/Week): 7, Weekly Exercise (Minutes/Week): 140, Intensity: Mild, Exercise limited by: neurologic condition(s)(MS)  Goals    . DIET - INCREASE WATER INTAKE     Recommend to drink at least 6-8 8oz glasses of water per day.       Fall Risk Fall Risk  07/16/2017 12/08/2016 12/03/2015 05/31/2015 11/27/2014  Falls in the past year? No No No No Yes  Number falls in past yr: - - - - 1  Injury with Fall? - - - - No  Risk for fall due to : History of fall(s);Impaired balance/gait;Impaired mobility;Impaired vision - - - Impaired balance/gait  Risk for fall due to: Comment MS, wears eyeglasses - - - -  Follow up - - - - Falls prevention discussed   Is the patient's home free of loose throw rugs in walkways, pet beds, electrical cords, etc?   Yes Does the patient have any grab bars in the bathroom? Yes  Does the patient use a shower chair when bathing? Yes Does the patient have any stairs in or around the home? Yes, avoids d/t MS If so, are there any handrails?  Yes, avoids d/t MS Does the patient have adequate lighting?  Yes Does the patient use a cane, walker or w/c? Yes, uses cane and walker for ambulation. Tires easily, uses w/c on occassion for longer outings. Does the patient use of an  elevated toilet seat? Yes  Timed Get Up and Go Performed: Yes. Pt ambulated 10 feet within 32 sec. Gait slow and unsteady. Stumbles on occasion. Used cane during ambulation. No intervention required at this time. Fall risk prevention has been discussed.  Community Resource Referral not required at this time.  Depression Screen PHQ 2/9 Scores 07/16/2017 12/08/2016 12/03/2015 05/31/2015  PHQ - 2 Score 2 0 0 0  PHQ- 9 Score 8 - - -     Cognitive Function     6CIT Screen 07/16/2017  What Year? 0 points  What month? 0 points  What time? 0 points  Count back from 20 0 points  Months in reverse 0 points  Repeat phrase 0 points  Total Score 0    Immunization History  Administered Date(s) Administered  . Influenza,inj,Quad PF,6+ Mos 06/16/2017  . Pneumococcal Conjugate-13 05/25/2014  . Pneumococcal Polysaccharide-23 05/20/2007, 05/25/2014, 12/08/2016  . Tdap 05/20/2007    Qualifies for Shingles Vaccine? Yes. Due for Zostavax or Shingrix vaccine. Education has been provided regarding the importance of this vaccine. Pt has been advised to call her insurance company to determine her out of pocket expense. Advised she may also receive this vaccine at her local pharmacy or Health Dept. Verbalized acceptance and understanding.  Due for Tdap vaccine. Declined my offer to administer today. Education has been provided regarding the importance of this vaccine but still declined. Pt has been advised to call her insurance company to determine her out of pocket expense. Advised she may also receive this vaccine at her local pharmacy or Health Dept. Verbalized acceptance and understanding.  Screening Tests Health Maintenance  Topic Date Due  . MAMMOGRAM  12/12/2015  . TETANUS/TDAP  07/16/2018 (Originally 05/19/2017)  . COLONOSCOPY  01/18/2023  . INFLUENZA VACCINE  Completed  . DEXA SCAN  Completed  . Hepatitis C Screening  Completed  . PNA vac Low Risk Adult  Completed    Cancer Screenings: Lung:  Low Dose CT Chest recommended if Age 21-80 years, 30 pack-year currently smoking OR have quit w/in 15years. Patient does qualify. An Epic message has  been sent to Burgess Estelle, RN (Oncology Nurse Navigator) regarding the possible need for this exam. Raquel Sarna will review the patient's chart to determine if the patient truly qualifies for the exam. If the patient qualifies, Raquel Sarna will order the Low Dose CT of the chest to facilitate the scheduling of this exam. Breast:  Up to date on Mammogram? No. Completed 12/12/14. Repeat every year. Repeat mammogram ordered 06/16/17. Not yet completed. Education has been provided regarding the importance of this screening. Message sent to referral coordinator for f/u on scheduling status.  Up to date of Bone Density/Dexa? Yes. Completed 09/29/14. Osteoporotic screenings no longer required. Colorectal: Completed colonoscopy 01/28/13. Repeat every 10 years  Additional Screenings: Hepatitis B/HIV/Syphillis: Does not qualify Hepatitis C Screening: Completed 12/08/16     Plan:  I have personally reviewed and addressed the Medicare Annual Wellness questionnaire and have noted the following in the patient's chart:  A. Medical and social history B. Use of alcohol, tobacco or illicit drugs  C. Current medications and supplements D. Functional ability and status E.  Nutritional status F.  Physical activity G. Advance directives H. List of other physicians I.  Hospitalizations, surgeries, and ER visits in previous 12 months J.  [place] such as hearing and vision if needed, cognitive and depression L. Referrals and appointments - none  In addition, I have reviewed and discussed with patient certain preventive protocols, quality metrics, and best practice recommendations. A written personalized care plan for preventive services as well as general preventive health recommendations were provided to patient.  Signed,  Aleatha Borer, LPN Nurse Health  Advisor  MD Recommendations: Due for Zostavax or Shingrix vaccine. Education has been provided regarding the importance of this vaccine. Pt has been advised to call her insurance company to determine her out of pocket expense. Advised she may also receive this vaccine at her local pharmacy or Health Dept. Verbalized acceptance and understanding.  Due for Tdap vaccine. Declined my offer to administer today. Education has been provided regarding the importance of this vaccine but still declined. Pt has been advised to call her insurance company to determine her out of pocket expense. Advised she may also receive this vaccine at her local pharmacy or Health Dept. Verbalized acceptance and understanding.  Due for mammogram. Completed 12/12/14. Repeat every year. Repeat mammogram ordered 06/16/17. Not yet completed. Education has been provided regarding the importance of this screening. Message sent to referral coordinator for f/u on scheduling status.   Lung Cancer Screening: Patient does qualify. An Epic message has been sent to Burgess Estelle, RN (Oncology Nurse Navigator) regarding the possible need for this exam. Raquel Sarna will review the patient's chart to determine if the patient truly qualifies for the exam. If the patient qualifies, Raquel Sarna will order the Low Dose CT of the chest to facilitate the scheduling of this exam.

## 2017-07-16 NOTE — Patient Instructions (Addendum)
Brooke Juarez , Thank you for taking time to come for your Medicare Wellness Visit. I appreciate your ongoing commitment to your health goals. Please review the following plan we discussed and let me know if I can assist you in the future.   Screening recommendations/referrals: Colorectal Screening: Completed colonoscopy 01/28/13. Repeat every 10 years Mammogram: Completed 12/12/14. Repeat every year. Repeat mammogram ordered 06/16/17. You will receive a call from our referral coordinator regarding your appointment. Bone Density: Completed 09/29/14. Osteoporotic screenings no longer required.  Vision/Dental Exams: Recommended yearly ophthalmology/optometry visit for glaucoma screening and checkup Recommended yearly dental visit for hygiene and checkup  Vaccinations: Influenza vaccine: Up to date Pneumococcal vaccine: Completed series Tdap vaccine: Declined. Please call your insurance company to determine your out of pocket expense. You may also receive this vaccine at your local pharmacy or Health Dept. Shingles vaccine: Please call your insurance company to determine your out of pocket expense for the Shingrix vaccine. You may also receive this vaccine at your local pharmacy or Health Dept.   Advanced directives: Advance directive discussed with you today. I have provided a copy for you to complete at home and have notarized. Once this is complete please bring a copy in to our office so we can scan it into your chart.  Conditions/risks identified: Recommend to drink at least 6-8 8oz glasses of water per day.  Next appointment: Please schedule your Annual Wellness Visit with your Nurse Health Advisor in one year.  Preventive Care 70 Years and Older, Female Preventive care refers to lifestyle choices and visits with your health care provider that can promote health and wellness. What does preventive care include?  A yearly physical exam. This is also called an annual well check.  Dental exams  once or twice a year.  Routine eye exams. Ask your health care provider how often you should have your eyes checked.  Personal lifestyle choices, including:  Daily care of your teeth and gums.  Regular physical activity.  Eating a healthy diet.  Avoiding tobacco and drug use.  Limiting alcohol use.  Practicing safe sex.  Taking low-dose aspirin every day.  Taking vitamin and mineral supplements as recommended by your health care provider. What happens during an annual well check? The services and screenings done by your health care provider during your annual well check will depend on your age, overall health, lifestyle risk factors, and family history of disease. Counseling  Your health care provider may ask you questions about your:  Alcohol use.  Tobacco use.  Drug use.  Emotional well-being.  Home and relationship well-being.  Sexual activity.  Eating habits.  History of falls.  Memory and ability to understand (cognition).  Work and work Statistician.  Reproductive health. Screening  You may have the following tests or measurements:  Height, weight, and BMI.  Blood pressure.  Lipid and cholesterol levels. These may be checked every 5 years, or more frequently if you are over 86 years old.  Skin check.  Lung cancer screening. You may have this screening every year starting at age 88 if you have a 30-pack-year history of smoking and currently smoke or have quit within the past 15 years.  Fecal occult blood test (FOBT) of the stool. You may have this test every year starting at age 38.  Flexible sigmoidoscopy or colonoscopy. You may have a sigmoidoscopy every 5 years or a colonoscopy every 10 years starting at age 33.  Hepatitis C blood test.  Hepatitis B blood test.  Sexually transmitted disease (STD) testing.  Diabetes screening. This is done by checking your blood sugar (glucose) after you have not eaten for a while (fasting). You may have  this done every 1-3 years.  Bone density scan. This is done to screen for osteoporosis. You may have this done starting at age 58.  Mammogram. This may be done every 1-2 years. Talk to your health care provider about how often you should have regular mammograms. Talk with your health care provider about your test results, treatment options, and if necessary, the need for more tests. Vaccines  Your health care provider may recommend certain vaccines, such as:  Influenza vaccine. This is recommended every year.  Tetanus, diphtheria, and acellular pertussis (Tdap, Td) vaccine. You may need a Td booster every 10 years.  Zoster vaccine. You may need this after age 46.  Pneumococcal 13-valent conjugate (PCV13) vaccine. One dose is recommended after age 51.  Pneumococcal polysaccharide (PPSV23) vaccine. One dose is recommended after age 27. Talk to your health care provider about which screenings and vaccines you need and how often you need them. This information is not intended to replace advice given to you by your health care provider. Make sure you discuss any questions you have with your health care provider. Document Released: 06/01/2015 Document Revised: 01/23/2016 Document Reviewed: 03/06/2015 Elsevier Interactive Patient Education  2017 [place] Prevention in the Home Falls can cause injuries. They can happen to people of all ages. There are many things you can do to make your home safe and to help prevent falls. What can I do on the outside of my home?  Regularly fix the edges of walkways and driveways and fix any cracks.  Remove anything that might make you trip as you walk through a door, such as a raised step or threshold.  Trim any bushes or trees on the path to your home.  Use bright outdoor lighting.  Clear any walking paths of anything that might make someone trip, such as rocks or tools.  Regularly check to see if handrails are loose or broken. Make sure  that both sides of any steps have handrails.  Any raised decks and porches should have guardrails on the edges.  Have any leaves, snow, or ice cleared regularly.  Use sand or salt on walking paths during winter.  Clean up any spills in your garage right away. This includes oil or grease spills. What can I do in the bathroom?  Use night lights.  Install grab bars by the toilet and in the tub and shower. Do not use towel bars as grab bars.  Use non-skid mats or decals in the tub or shower.  If you need to sit down in the shower, use a plastic, non-slip stool.  Keep the floor dry. Clean up any water that spills on the floor as soon as it happens.  Remove soap buildup in the tub or shower regularly.  Attach bath mats securely with double-sided non-slip rug tape.  Do not have throw rugs and other things on the floor that can make you trip. What can I do in the bedroom?  Use night lights.  Make sure that you have a light by your bed that is easy to reach.  Do not use any sheets or blankets that are too big for your bed. They should not hang down onto the floor.  Have a firm chair that has side arms. You can use this for support while you get  dressed.  Do not have throw rugs and other things on the floor that can make you trip. What can I do in the kitchen?  Clean up any spills right away.  Avoid walking on wet floors.  Keep items that you use a lot in easy-to-reach places.  If you need to reach something above you, use a strong step stool that has a grab bar.  Keep electrical cords out of the way.  Do not use floor polish or wax that makes floors slippery. If you must use wax, use non-skid floor wax.  Do not have throw rugs and other things on the floor that can make you trip. What can I do with my stairs?  Do not leave any items on the stairs.  Make sure that there are handrails on both sides of the stairs and use them. Fix handrails that are broken or loose. Make  sure that handrails are as long as the stairways.  Check any carpeting to make sure that it is firmly attached to the stairs. Fix any carpet that is loose or worn.  Avoid having throw rugs at the top or bottom of the stairs. If you do have throw rugs, attach them to the floor with carpet tape.  Make sure that you have a light switch at the top of the stairs and the bottom of the stairs. If you do not have them, ask someone to add them for you. What else can I do to help prevent falls?  Wear shoes that:  Do not have high heels.  Have rubber bottoms.  Are comfortable and fit you well.  Are closed at the toe. Do not wear sandals.  If you use a stepladder:  Make sure that it is fully opened. Do not climb a closed stepladder.  Make sure that both sides of the stepladder are locked into place.  Ask someone to hold it for you, if possible.  Clearly mark and make sure that you can see:  Any grab bars or handrails.  First and last steps.  Where the edge of each step is.  Use tools that help you move around (mobility aids) if they are needed. These include:  Canes.  Walkers.  Scooters.  Crutches.  Turn on the lights when you go into a dark area. Replace any light bulbs as soon as they burn out.  Set up your furniture so you have a clear path. Avoid moving your furniture around.  If any of your floors are uneven, fix them.  If there are any pets around you, be aware of where they are.  Review your medicines with your doctor. Some medicines can make you feel dizzy. This can increase your chance of falling. Ask your doctor what other things that you can do to help prevent falls. This information is not intended to replace advice given to you by your health care provider. Make sure you discuss any questions you have with your health care provider. Document Released: 03/01/2009 Document Revised: 10/11/2015 Document Reviewed: 06/09/2014 Elsevier Interactive Patient Education   2017 [employer].

## 2017-07-20 ENCOUNTER — Telehealth: Payer: Self-pay | Admitting: *Deleted

## 2017-07-20 NOTE — Telephone Encounter (Signed)
Received referral for low dose lung cancer screening CT scan. Message left at phone number listed in EMR for patient to call me back to facilitate scheduling scan.  

## 2017-07-22 ENCOUNTER — Other Ambulatory Visit: Payer: Self-pay

## 2017-07-22 ENCOUNTER — Telehealth: Payer: Self-pay | Admitting: *Deleted

## 2017-07-22 DIAGNOSIS — Z87891 Personal history of nicotine dependence: Secondary | ICD-10-CM

## 2017-07-22 DIAGNOSIS — Z122 Encounter for screening for malignant neoplasm of respiratory organs: Secondary | ICD-10-CM

## 2017-07-22 NOTE — Telephone Encounter (Signed)
Received referral for initial lung cancer screening scan. Contacted patient and obtained smoking history,(current, 41.25) as well as answering questions related to screening process. Patient denies signs of lung cancer such as weight loss or hemoptysis. Patient denies comorbidity that would prevent curative treatment if lung cancer were found. Patient is scheduled for shared decision making visit and CT scan on 08/28/17.

## 2017-07-24 DIAGNOSIS — H353131 Nonexudative age-related macular degeneration, bilateral, early dry stage: Secondary | ICD-10-CM | POA: Diagnosis not present

## 2017-07-24 DIAGNOSIS — H26491 Other secondary cataract, right eye: Secondary | ICD-10-CM | POA: Diagnosis not present

## 2017-07-27 DIAGNOSIS — F5105 Insomnia due to other mental disorder: Secondary | ICD-10-CM | POA: Diagnosis not present

## 2017-07-27 DIAGNOSIS — R569 Unspecified convulsions: Secondary | ICD-10-CM | POA: Diagnosis not present

## 2017-07-27 DIAGNOSIS — F330 Major depressive disorder, recurrent, mild: Secondary | ICD-10-CM | POA: Diagnosis not present

## 2017-07-30 ENCOUNTER — Ambulatory Visit
Admission: RE | Admit: 2017-07-30 | Discharge: 2017-07-30 | Disposition: A | Discharge status: Home / Self Care | Payer: Medicare Other | Source: Ambulatory Visit | Attending: Family Medicine | Admitting: Family Medicine

## 2017-07-30 DIAGNOSIS — Z1231 Encounter for screening mammogram for malignant neoplasm of breast: Secondary | ICD-10-CM | POA: Insufficient documentation

## 2017-08-14 ENCOUNTER — Ambulatory Visit
Admission: RE | Admit: 2017-08-14 | Payer: Medicare Other | Source: Ambulatory Visit | Admitting: Unknown Physician Specialty

## 2017-08-14 ENCOUNTER — Encounter: Admission: RE | Payer: Self-pay | Source: Ambulatory Visit

## 2017-08-14 SURGERY — COLONOSCOPY WITH PROPOFOL
Anesthesia: General

## 2017-08-16 ENCOUNTER — Other Ambulatory Visit: Payer: Self-pay | Admitting: Family Medicine

## 2017-08-16 DIAGNOSIS — J301 Allergic rhinitis due to pollen: Secondary | ICD-10-CM

## 2017-08-28 ENCOUNTER — Ambulatory Visit: Payer: Medicare Other

## 2017-08-28 ENCOUNTER — Ambulatory Visit: Payer: Medicare Other | Admitting: Nurse Practitioner

## 2017-11-11 ENCOUNTER — Other Ambulatory Visit: Payer: Self-pay | Admitting: Family Medicine

## 2017-11-11 DIAGNOSIS — K219 Gastro-esophageal reflux disease without esophagitis: Secondary | ICD-10-CM

## 2017-11-23 DIAGNOSIS — F330 Major depressive disorder, recurrent, mild: Secondary | ICD-10-CM | POA: Diagnosis not present

## 2017-11-23 DIAGNOSIS — F5105 Insomnia due to other mental disorder: Secondary | ICD-10-CM | POA: Diagnosis not present

## 2017-11-25 ENCOUNTER — Ambulatory Visit (INDEPENDENT_AMBULATORY_CARE_PROVIDER_SITE_OTHER): Payer: Medicare Other | Admitting: Family Medicine

## 2017-11-25 ENCOUNTER — Encounter: Payer: Self-pay | Admitting: Family Medicine

## 2017-11-25 VITALS — BP 120/70 | HR 80 | Ht 62.0 in | Wt 145.0 lb

## 2017-11-25 DIAGNOSIS — F330 Major depressive disorder, recurrent, mild: Secondary | ICD-10-CM

## 2017-11-25 DIAGNOSIS — J301 Allergic rhinitis due to pollen: Secondary | ICD-10-CM | POA: Diagnosis not present

## 2017-11-25 DIAGNOSIS — E039 Hypothyroidism, unspecified: Secondary | ICD-10-CM | POA: Diagnosis not present

## 2017-11-25 DIAGNOSIS — E876 Hypokalemia: Secondary | ICD-10-CM | POA: Insufficient documentation

## 2017-11-25 DIAGNOSIS — G2581 Restless legs syndrome: Secondary | ICD-10-CM | POA: Diagnosis not present

## 2017-11-25 DIAGNOSIS — F17200 Nicotine dependence, unspecified, uncomplicated: Secondary | ICD-10-CM | POA: Diagnosis not present

## 2017-11-25 DIAGNOSIS — K219 Gastro-esophageal reflux disease without esophagitis: Secondary | ICD-10-CM

## 2017-11-25 DIAGNOSIS — M8000XS Age-related osteoporosis with current pathological fracture, unspecified site, sequela: Secondary | ICD-10-CM

## 2017-11-25 MED ORDER — PANTOPRAZOLE SODIUM 40 MG PO TBEC: 40.0000 mg | DELAYED_RELEASE_TABLET | Freq: Every day | ORAL | 1 refills | Status: DC

## 2017-11-25 MED ORDER — ROPINIROLE HCL 1 MG PO TABS: 1.0000 mg | ORAL_TABLET | Freq: Every day | ORAL | 1 refills | Status: DC

## 2017-11-25 MED ORDER — POTASSIUM CHLORIDE ER 10 MEQ PO TBCR: 10.0000 meq | EXTENDED_RELEASE_TABLET | Freq: Every day | ORAL | 1 refills | Status: DC

## 2017-11-25 MED ORDER — ALENDRONATE SODIUM 70 MG PO TABS: 70.0000 mg | ORAL_TABLET | ORAL | 1 refills | Status: DC

## 2017-11-25 MED ORDER — TRAZODONE HCL 100 MG PO TABS: 100.0000 mg | ORAL_TABLET | Freq: Every day | ORAL | 1 refills | Status: DC

## 2017-11-25 MED ORDER — LEVOTHYROXINE SODIUM 100 MCG PO TABS: ORAL_TABLET | ORAL | 1 refills | Status: DC

## 2017-11-25 MED ORDER — MONTELUKAST SODIUM 10 MG PO TABS: 10.0000 mg | ORAL_TABLET | Freq: Every day | ORAL | 1 refills | Status: DC

## 2017-11-25 NOTE — Assessment & Plan Note (Signed)
Chronic Controlled Continue Ropinorole 1 mg nightly.

## 2017-11-25 NOTE — Progress Notes (Signed)
Name: Brooke Juarez   MRN: [id number]    DOB: [birth year]   Date:11/25/2017       Progress Note  Subjective  Chief Complaint  Chief Complaint  Patient presents with  . Osteoporosis  . Insomnia    takes trazadone  . restless leg  . Hypothyroidism  . hypokalemia  . Allergic Rhinitis     In addition to HPI patient presents with medication refill for restless leg, osteoporosis, and hypokalemia.  Insomnia  Primary symptoms: no fragmented sleep, no sleep disturbance, difficulty falling asleep, no somnolence, frequent awakening, no premature morning awakening, no malaise/fatigue, no napping.   The current episode started more than one year. The onset quality is gradual. The problem occurs intermittently. The problem is unchanged. Past treatments include medication (deseryl). PMH includes: no depression.  Thyroid Problem  Presents for follow-up visit. Symptoms include weight gain. Patient reports no anxiety, cold intolerance, constipation, depressed mood, diaphoresis, diarrhea, dry skin, fatigue, hair loss, heat intolerance, hoarse voice, leg swelling, menstrual problem, nail problem, palpitations, tremors, visual change or weight loss. The symptoms have been stable.  Gastroesophageal Reflux  She complains of heartburn. She reports no abdominal pain, no belching, no chest pain, no choking, no coughing, no dysphagia, no early satiety, no hoarse voice, no nausea, no sore throat, no water brash or no wheezing. This is a recurrent problem. The current episode started more than 1 year ago. The problem occurs occasionally. The problem has been waxing and waning ("flares up"). The heartburn is located in the substernum. The heartburn is of moderate intensity. The symptoms are aggravated by certain foods. Pertinent negatives include no anemia, fatigue, melena, muscle weakness, orthopnea or weight loss.    OP (osteoporosis) Chronic Stable Continue alendronate 75 mg weekly.  Acid reflux Chronic Stable  Continue pantoprazole 40 mg daily  Adult hypothyroidism Chronic Controlled Will check TSH and levothyroxine dosage continued pending results.  Hypokalemia Stable Continue potassium supplement.  Restless leg Chronic Controlled Continue Ropinorole 1 mg nightly.  Clinical depression Chronic Controlled. Continue trazodone 100 mg nightly   Seasonal allergic rhinitis due to pollen Controlled. Continue Singulair daily prn   Past Medical History:  Diagnosis Date  . Crohn disease ([place])   . Depression   . GERD (gastroesophageal reflux disease)   . Heart murmur   . Hyperlipidemia   . Hypothyroidism   . MS (multiple sclerosis) ([place])   . Neuromuscular disorder ([place])    multiple sclerosis  . Restless leg   . Seizures ([place])   . Tremors of nervous system     Past Surgical History:  Procedure Laterality Date  . CATARACT EXTRACTION W/PHACO Right 10/30/2014   Procedure: CATARACT EXTRACTION PHACO AND INTRAOCULAR LENS PLACEMENT ([place]);  Surgeon: Estill Cotta, MD;  Location: [facility] ORS;  Service: Ophthalmology;  Laterality: Right;  [place]  1:55.7   . CATARACT EXTRACTION W/PHACO Left 06/18/2015   Procedure: CATARACT EXTRACTION PHACO AND INTRAOCULAR LENS PLACEMENT (IOC);  Surgeon: Estill Cotta, MD;  Location: [facility] ORS;  Service: Ophthalmology;  Laterality: Left;  [place] 01:36 AP% 26.1 CDE 42.19 fluid pack lo0t # [id number]  . COLONOSCOPY  2014   normal- Rein- repeat in 5 years  . EYE SURGERY    . FOOT SURGERY    . NASAL SINUS SURGERY    . TONSILLECTOMY    . TUBAL LIGATION    . VAGINAL HYSTERECTOMY      Family History  Problem Relation Age of Onset  . Cancer Mother   . Heart disease Mother   .  Stroke Mother   . Stroke Father   . Breast cancer Neg Hx     Social History   Socioeconomic History  . Marital status: Married    Spouse name: Not on file  . Number of children: 3  . Years of education: some college  . Highest education level: 12th grade  Occupational History  .  Occupation: Retired  Scientific laboratory technician  . Financial resource strain: Not hard at all  . Food insecurity:    Worry: Never true    Inability: Never true  . Transportation needs:    Medical: No    Non-medical: No  Tobacco Use  . Smoking status: Current Every Day Smoker    Packs/day: 0.50    Years: 55.00    Pack years: 27.50    Types: Cigarettes  . Smokeless tobacco: Never Used  . Tobacco comment: takes Wellbutrin; continuing to reduce # of cigarettes smoked; Referred to Burgess Estelle, RN  Substance and Sexual Activity  . Alcohol use: No    Alcohol/week: 0.0 oz  . Drug use: No  . Sexual activity: Not Currently  Lifestyle  . Physical activity:    Days per week: 7 days    Minutes per session: 20 min  . Stress: To some extent  Relationships  . Social connections:    Talks on phone: Patient refused    Gets together: Patient refused    Attends religious service: Patient refused    Active member of club or organization: Patient refused    Attends meetings of clubs or organizations: Patient refused    Relationship status: Married  . Intimate partner violence:    Fear of current or ex partner: No    Emotionally abused: No    Physically abused: No    Forced sexual activity: No  Other Topics Concern  . Not on file  Social History Narrative  . Not on file    Allergies  Allergen Reactions  . Codeine     Outpatient Medications Prior to Visit  Medication Sig Dispense Refill  . buPROPion (WELLBUTRIN SR) 150 MG 12 hr tablet Take 1 tablet by mouth 2 (two) times daily.    . Cholecalciferol (VITAMIN D) 2000 UNITS CAPS Take 1 capsule (2,000 Units total) by mouth daily. 30 capsule   . gabapentin (NEURONTIN) 600 MG tablet Take 600 mg by mouth 2 (two) times daily. Dr [name]    . OMEGA 3-6-9 FATTY ACIDS PO Take by mouth.    . sulfaSALAzine (AZULFIDINE) 500 MG EC tablet Micheele Johnson/ GI    . vitamin B-12 (CYANOCOBALAMIN) 500 MCG tablet Take 500 mcg by mouth daily.    [name] alendronate  (FOSAMAX) 70 MG tablet Take 1 tablet (70 mg total) by mouth once a week. Take with a full glass of water on an empty stomach. 12 tablet 1  . aspirin 81 MG tablet Take 1 tablet (81 mg total) by mouth daily. 30 tablet 11  . levothyroxine (SYNTHROID, LEVOTHROID) 100 MCG tablet TAKE ONE TABLET BY MOUTH ONCE DAILY. NEEDS APPOINTMENT 90 tablet 1  . montelukast (SINGULAIR) 10 MG tablet TAKE 1 TABLET BY MOUTH AT BEDTIME 30 tablet 3  . pantoprazole (PROTONIX) 40 MG tablet TAKE 1 TABLET BY MOUTH ONCE DAILY 90 tablet 0  . potassium chloride (K-DUR) 10 MEQ tablet Take 1 tablet by mouth daily.    [name] rOPINIRole (REQUIP) 1 MG tablet Take 1 tablet (1 mg total) by mouth daily. 90 tablet 1  . traZODone (DESYREL) 100 MG tablet  Take 1 tablet (100 mg total) by mouth daily. 30 tablet 5   No facility-administered medications prior to visit.     Review of Systems  Constitutional: Positive for weight gain. Negative for chills, diaphoresis, fatigue, fever, malaise/fatigue and weight loss.  HENT: Negative for ear discharge, ear pain, hoarse voice and sore throat.   Eyes: Negative for blurred vision.  Respiratory: Negative for cough, sputum production, choking, shortness of breath and wheezing.   Cardiovascular: Negative for chest pain, palpitations and leg swelling.  Gastrointestinal: Positive for heartburn. Negative for abdominal pain, blood in stool, constipation, diarrhea, dysphagia, melena and nausea.  Genitourinary: Negative for dysuria, frequency, hematuria, menstrual problem and urgency.  Musculoskeletal: Negative for back pain, joint pain, myalgias, muscle weakness and neck pain.  Skin: Negative for rash.  Neurological: Negative for dizziness, tingling, tremors, sensory change, focal weakness and headaches.  Endo/Heme/Allergies: Negative for environmental allergies, cold intolerance, heat intolerance and polydipsia. Does not bruise/bleed easily.  Psychiatric/Behavioral: Negative for depression, sleep disturbance  and suicidal ideas. The patient has insomnia. The patient is not nervous/anxious.      Objective  Vitals:   11/25/17 0915  BP: 120/70  Pulse: 80  Weight: 145 lb (65.8 kg)  Height: 5' 2"  (1.575 m)    Physical Exam  Constitutional: No distress.  HENT:  Head: Normocephalic and atraumatic.  Right Ear: External ear normal.  Left Ear: External ear normal.  Nose: Nose normal.  Mouth/Throat: Oropharynx is clear and moist.  Eyes: Pupils are equal, round, and reactive to light. Conjunctivae and EOM are normal. Right eye exhibits no discharge. Left eye exhibits no discharge.  Neck: Normal range of motion. Neck supple. No JVD present. No thyromegaly present.  Cardiovascular: Normal rate, regular rhythm, normal heart sounds and intact distal pulses. Exam reveals no gallop and no friction rub.  No murmur heard. Pulmonary/Chest: Effort normal and breath sounds normal. She has no wheezes. She has no rales.  Abdominal: Soft. Bowel sounds are normal. She exhibits no mass. There is no tenderness. There is no guarding.  Musculoskeletal: Normal range of motion. She exhibits no edema.  Lymphadenopathy:    She has no cervical adenopathy.  Neurological: She is alert. She has normal reflexes.  Skin: Skin is warm and dry. She is not diaphoretic.  Nursing note and vitals reviewed.     Assessment & Plan  Problem List Items Addressed This Visit      Respiratory   Seasonal allergic rhinitis due to pollen    Controlled. Continue Singulair daily prn      Relevant Medications   montelukast (SINGULAIR) 10 MG tablet     Digestive   Acid reflux    Chronic Stable Continue pantoprazole 40 mg daily      Relevant Medications   sulfaSALAzine (AZULFIDINE) 500 MG EC tablet   pantoprazole (PROTONIX) 40 MG tablet     Endocrine   Adult hypothyroidism - Primary    Chronic Controlled Will check TSH and levothyroxine dosage continued pending results.      Relevant Medications   levothyroxine  (SYNTHROID, LEVOTHROID) 100 MCG tablet     Musculoskeletal and Integument   OP (osteoporosis)    Chronic Stable Continue alendronate 75 mg weekly.      Relevant Medications   alendronate (FOSAMAX) 70 MG tablet     Other   Clinical depression    Chronic Controlled. Continue trazodone 100 mg nightly       Relevant Medications   traZODone (DESYREL) 100 MG tablet  Restless leg    Chronic Controlled Continue Ropinorole 1 mg nightly.      Relevant Medications   rOPINIRole (REQUIP) 1 MG tablet   Hypokalemia    Stable Continue potassium supplement.      Relevant Medications   potassium chloride (K-DUR) 10 MEQ tablet    Other Visit Diagnoses    Tobacco dependence       Smoking cessation discussed.      Meds ordered this encounter  Medications  . alendronate (FOSAMAX) 70 MG tablet    Sig: Take 1 tablet (70 mg total) by mouth once a week. Take with a full glass of water on an empty stomach.    Dispense:  12 tablet    Refill:  1  . levothyroxine (SYNTHROID, LEVOTHROID) 100 MCG tablet    Sig: TAKE ONE TABLET BY MOUTH ONCE DAILY. NEEDS APPOINTMENT    Dispense:  90 tablet    Refill:  1  . montelukast (SINGULAIR) 10 MG tablet    Sig: Take 1 tablet (10 mg total) by mouth at bedtime.    Dispense:  90 tablet    Refill:  1    Please consider 90 day supplies to promote better adherence  . potassium chloride (K-DUR) 10 MEQ tablet    Sig: Take 1 tablet (10 mEq total) by mouth daily.    Dispense:  90 tablet    Refill:  1  . rOPINIRole (REQUIP) 1 MG tablet    Sig: Take 1 tablet (1 mg total) by mouth daily.    Dispense:  90 tablet    Refill:  1  . traZODone (DESYREL) 100 MG tablet    Sig: Take 1 tablet (100 mg total) by mouth daily.    Dispense:  90 tablet    Refill:  1  . pantoprazole (PROTONIX) 40 MG tablet    Sig: Take 1 tablet (40 mg total) by mouth daily.    Dispense:  90 tablet    Refill:  1      Dr. Otilio Miu [facility]  11/25/17

## 2017-11-25 NOTE — Assessment & Plan Note (Signed)
Chronic Stable Continue pantoprazole 40 mg daily

## 2017-11-25 NOTE — Assessment & Plan Note (Signed)
Chronic Controlled Will check TSH and levothyroxine dosage continued pending results.

## 2017-11-25 NOTE — Assessment & Plan Note (Signed)
Controlled. Continue Singulair daily prn

## 2017-11-25 NOTE — Assessment & Plan Note (Signed)
Stable Continue potassium supplement.

## 2017-11-25 NOTE — Assessment & Plan Note (Signed)
Chronic Stable Continue alendronate 75 mg weekly.

## 2017-11-25 NOTE — Assessment & Plan Note (Signed)
Chronic Controlled. Continue trazodone 100 mg nightly

## 2018-03-22 DIAGNOSIS — F330 Major depressive disorder, recurrent, mild: Secondary | ICD-10-CM | POA: Diagnosis not present

## 2018-03-22 DIAGNOSIS — F5105 Insomnia due to other mental disorder: Secondary | ICD-10-CM | POA: Diagnosis not present

## 2018-04-04 ENCOUNTER — Emergency Department
Admission: EM | Admit: 2018-04-04 | Discharge: 2018-04-04 | Disposition: A | Discharge status: Home / Self Care | Payer: Medicare Other | Attending: Emergency Medicine | Admitting: Emergency Medicine

## 2018-04-04 ENCOUNTER — Encounter: Payer: Self-pay | Admitting: Emergency Medicine

## 2018-04-04 ENCOUNTER — Emergency Department: Payer: Medicare Other

## 2018-04-04 DIAGNOSIS — F17210 Nicotine dependence, cigarettes, uncomplicated: Secondary | ICD-10-CM | POA: Insufficient documentation

## 2018-04-04 DIAGNOSIS — E039 Hypothyroidism, unspecified: Secondary | ICD-10-CM | POA: Insufficient documentation

## 2018-04-04 DIAGNOSIS — Z79899 Other long term (current) drug therapy: Secondary | ICD-10-CM | POA: Diagnosis not present

## 2018-04-04 DIAGNOSIS — K4090 Unilateral inguinal hernia, without obstruction or gangrene, not specified as recurrent: Secondary | ICD-10-CM | POA: Insufficient documentation

## 2018-04-04 DIAGNOSIS — I2510 Atherosclerotic heart disease of native coronary artery without angina pectoris: Secondary | ICD-10-CM | POA: Diagnosis not present

## 2018-04-04 DIAGNOSIS — R1909 Other intra-abdominal and pelvic swelling, mass and lump: Secondary | ICD-10-CM | POA: Diagnosis not present

## 2018-04-04 DIAGNOSIS — R1903 Right lower quadrant abdominal swelling, mass and lump: Secondary | ICD-10-CM | POA: Diagnosis not present

## 2018-04-04 LAB — COMPREHENSIVE METABOLIC PANEL
ALBUMIN: 4.1 g/dL (ref 3.5–5.0)
ALK PHOS: 99 U/L (ref 38–126)
ALT: 12 U/L (ref 0–44)
AST: 16 U/L (ref 15–41)
Anion gap: 9 (ref 5–15)
BILIRUBIN TOTAL: 1.1 mg/dL (ref 0.3–1.2)
BUN: 7 mg/dL — AB (ref 8–23)
CALCIUM: 9 mg/dL (ref 8.9–10.3)
CO2: 28 mmol/L (ref 22–32)
CREATININE: 0.81 mg/dL (ref 0.44–1.00)
Chloride: 103 mmol/L (ref 98–111)
GFR calc Af Amer: >60 mL/min (ref >60)
GLUCOSE: 105 mg/dL — AB (ref 70–99)
POTASSIUM: 4 mmol/L (ref 3.5–5.1)
Sodium: 140 mmol/L (ref 135–145)
TOTAL PROTEIN: 5.4 g/dL — AB (ref 6.5–8.1)

## 2018-04-04 LAB — CBC
HCT: 39.7 % (ref 36.0–46.0)
HEMOGLOBIN: 12.5 g/dL (ref 12.0–15.0)
MCH: 32.3 pg (ref 26.0–34.0)
MCHC: 31.5 g/dL (ref 30.0–36.0)
MCV: 102.6 fL — ABNORMAL HIGH (ref 80.0–100.0)
Platelets: 152 10*3/uL (ref 150–400)
RBC: 3.87 MIL/uL (ref 3.87–5.11)
RDW: 12.6 % (ref 11.5–15.5)
WBC: 10 10*3/uL (ref 4.0–10.5)
nRBC: 0 % (ref 0.0–0.2)

## 2018-04-04 MED ORDER — IOHEXOL 300 MG/ML  SOLN
100.0000 mL | Freq: Once | INTRAMUSCULAR | Status: AC | PRN
  Administered 2018-04-04: 100 mL via INTRAVENOUS
  Filled 2018-04-04: qty 100

## 2018-04-04 MED ORDER — IOPAMIDOL (ISOVUE-300) INJECTION 61%
15.0000 mL | INTRAVENOUS | Status: AC
  Administered 2018-04-04 (×2): 15 mL via ORAL
  Filled 2018-04-04 (×2): qty 15

## 2018-04-04 MED ORDER — TRAMADOL HCL 50 MG PO TABS
50.0000 mg | ORAL_TABLET | Freq: Once | ORAL | Status: AC
Start: 2018-04-04 — End: 2018-04-04
  Administered 2018-04-04: 50 mg via ORAL
  Filled 2018-04-04: qty 1

## 2018-04-04 MED ORDER — TRAMADOL HCL 50 MG PO TABS: 50.0000 mg | ORAL_TABLET | Freq: Three times a day (TID) | ORAL | 0 refills | Status: DC | PRN

## 2018-04-04 NOTE — ED Provider Notes (Signed)
[facility] Emergency Department Provider Note ____________________________________________  Time seen: 0710  I have reviewed the triage vital signs and the nursing notes.  HISTORY  Chief Complaint  Abscess   HPI Brooke Juarez is a 67 y.o. female presents to the ER today with complaint of a hard, painful mass to her right groin.  She reports she noticed this yesterday.  She reports that the mass is painful to touch and is also causing pain with ambulation.  She has not noticed this mass prior.  She reports that has not gotten bigger in size.  It has not drained.  She denies any injury to the area that she is aware of.  Her bowels are moving normally.  She denies any urinary or vaginal complaints.  She has not taken anything over-the-counter for her symptoms.  Past Medical History:  Diagnosis Date  . Crohn disease ([place])   . Depression   . GERD (gastroesophageal reflux disease)   . Heart murmur   . Hyperlipidemia   . Hypothyroidism   . MS (multiple sclerosis) ([place])   . Neuromuscular disorder ([place])    multiple sclerosis  . Restless leg   . Seizures ([place])   . Tremors of nervous system     Patient Active Problem List   Diagnosis Date Noted  . Hypokalemia 11/25/2017  . Seasonal allergic rhinitis due to pollen 12/08/2016  . CAD (coronary artery disease) 11/27/2014  . Medicare annual wellness visit, subsequent 11/27/2014  . Vitamin D deficiency 11/22/2014  . Familial multiple lipoprotein-type hyperlipidemia 09/29/2014  . Clinical depression 09/29/2014  . Acid reflux 09/29/2014  . Routine general medical examination at a health care facility 09/29/2014  . Restless leg 09/29/2014  . DS (disseminated sclerosis) ([place]) 09/29/2014  . Nicotine addiction 09/29/2014  . OP (osteoporosis) 09/29/2014  . Adult hypothyroidism 10/05/2013  . Colitis gravis ([place]) 10/05/2013    Past Surgical History:  Procedure Laterality Date  . CATARACT EXTRACTION W/PHACO Right  10/30/2014   Procedure: CATARACT EXTRACTION PHACO AND INTRAOCULAR LENS PLACEMENT ([place]);  Surgeon: Estill Cotta, MD;  Location: [facility] ORS;  Service: Ophthalmology;  Laterality: Right;  [place]  1:55.7   . CATARACT EXTRACTION W/PHACO Left 06/18/2015   Procedure: CATARACT EXTRACTION PHACO AND INTRAOCULAR LENS PLACEMENT (IOC);  Surgeon: Estill Cotta, MD;  Location: [facility] ORS;  Service: Ophthalmology;  Laterality: Left;  [place] 01:36 AP% 26.1 CDE 42.19 fluid pack lo0t # [id number]  . COLONOSCOPY  2014   normal- Rein- repeat in 5 years  . EYE SURGERY    . FOOT SURGERY    . NASAL SINUS SURGERY    . TONSILLECTOMY    . TUBAL LIGATION    . VAGINAL HYSTERECTOMY      Prior to Admission medications   Medication Sig Start Date End Date Taking? Authorizing Provider  alendronate (FOSAMAX) 70 MG tablet Take 1 tablet (70 mg total) by mouth once a week. Take with a full glass of water on an empty stomach. 11/25/17   Juline Patch, MD  buPROPion (WELLBUTRIN SR) 150 MG 12 hr tablet Take 1 tablet by mouth 2 (two) times daily. 10/17/14   [provider]  Cholecalciferol (VITAMIN D) 2000 UNITS CAPS Take 1 capsule (2,000 Units total) by mouth daily. 11/22/14   Plonk, Gwyndolyn Saxon, MD  gabapentin (NEURONTIN) 600 MG tablet Take 600 mg by mouth 2 (two) times daily. Dr [name]    [provider]  levothyroxine (SYNTHROID, LEVOTHROID) 100 MCG tablet TAKE ONE TABLET BY MOUTH ONCE  DAILY. NEEDS APPOINTMENT 11/25/17   Juline Patch, MD  montelukast (SINGULAIR) 10 MG tablet Take 1 tablet (10 mg total) by mouth at bedtime. 11/25/17   Juline Patch, MD  OMEGA 3-6-9 FATTY ACIDS PO Take by mouth.    [provider]  pantoprazole (PROTONIX) 40 MG tablet Take 1 tablet (40 mg total) by mouth daily. 11/25/17   Juline Patch, MD  potassium chloride (K-DUR) 10 MEQ tablet Take 1 tablet (10 mEq total) by mouth daily. 11/25/17 11/25/18  Juline Patch, MD  rOPINIRole (REQUIP) 1 MG tablet Take 1 tablet (1 mg total) by  mouth daily. 11/25/17   Juline Patch, MD  sulfaSALAzine (AZULFIDINE) 500 MG EC tablet Micheele Johnson/ GI 10/14/17   [provider]  traMADol (ULTRAM) 50 MG tablet Take 1 tablet (50 mg total) by mouth every 8 (eight) hours as needed. 04/04/18   Jearld Fenton, NP  traZODone (DESYREL) 100 MG tablet Take 1 tablet (100 mg total) by mouth daily. 11/25/17   Juline Patch, MD  vitamin B-12 (CYANOCOBALAMIN) 500 MCG tablet Take 500 mcg by mouth daily.    [provider]    Allergies Codeine  Family History  Problem Relation Age of Onset  . Cancer Mother   . Heart disease Mother   . Stroke Mother   . Stroke Father   . Breast cancer Neg Hx     Social History Social History   Tobacco Use  . Smoking status: Current Every Day Smoker    Packs/day: 0.50    Years: 55.00    Pack years: 27.50    Types: Cigarettes  . Smokeless tobacco: Never Used  . Tobacco comment: takes Wellbutrin; continuing to reduce # of cigarettes smoked; Referred to Burgess Estelle, RN  Substance Use Topics  . Alcohol use: No    Alcohol/week: 0.0 standard drinks  . Drug use: No    Review of Systems  Constitutional: Negative for fever, chills or body aches. Gastrointestinal: Negative for abdominal pain, vomiting, constipation, diarrhea or blood in her stool. Genitourinary: Negative for urgency, frequency, dysuria or blood in her urine. Skin: Positive for mass of right groin.  Negative for redness, rash.  ____________________________________________  PHYSICAL EXAM:  VITAL SIGNS: ED Triage Vitals [04/04/18 0654]  Enc Vitals Group     BP 103/61     Pulse Rate 79     Resp 18     Temp 98.8 F (37.1 C)     Temp Source Oral     SpO2 96 %     Weight 145 lb (65.8 kg)     Height 5' 2"  (1.575 m)     Head Circumference      Peak Flow      Pain Score 8     Pain Loc      Pain Edu?      Excl. in [place]?     Constitutional: Alert and oriented. Well appearing, appears in pain.. Cardiovascular:  Normal rate, regular rhythm.  Respiratory: Normal respiratory effort. No wheezes/rales/rhonchi. Gastrointestinal: Soft and mildly tender (history of Crohn's). No distention. Neurologic:  Normal speech and language.  Skin:  Skin is warm, dry and intact.  4 cm x 4 cm hard mass noted in the right suprapubic area.  Nonfluctuant, tender to palpation.  No erythema noted. ____________________________________________  LABS CBC    Component Value Date/Time   WBC 10.0 04/04/2018 0816   RBC 3.87 04/04/2018 0816   HGB 12.5 04/04/2018 0816  HCT 39.7 04/04/2018 0816   PLT 152 04/04/2018 0816   MCV 102.6 (H) 04/04/2018 0816   MCH 32.3 04/04/2018 0816   MCHC 31.5 04/04/2018 0816   RDW 12.6 04/04/2018 0816   CMP Latest Ref Rng & Units 04/04/2018 06/09/2016 11/15/2015  Glucose 70 - 99 mg/dL 105(H) 80 -  BUN 8 - 23 mg/dL 7(L) 7(L) -  Creatinine 0.44 - 1.00 mg/dL 0.81 0.79 0.90  Sodium 135 - 145 mmol/L 140 143 -  Potassium 3.5 - 5.1 mmol/L 4.0 4.6 -  Chloride 98 - 111 mmol/L 103 103 -  CO2 22 - 32 mmol/L 28 25 -  Calcium 8.9 - 10.3 mg/dL 9.0 9.3 -  Total Protein 6.5 - 8.1 g/dL 5.4(L) - -  Total Bilirubin 0.3 - 1.2 mg/dL 1.1 - -  Alkaline Phos 38 - 126 U/L 99 - -  AST 15 - 41 U/L 16 - -  ALT 0 - 44 U/L 12 - -    _____________________________________________  RADIOLOGY   Imaging Orders     CT ABDOMEN PELVIS W CONTRAST  IMPRESSION: 1. Moderately large right inguinal hernia containing fat and fluid. 2. Otherwise, unremarkable examination. ____________________________________________    INITIAL IMPRESSION / ASSESSMENT AND PLAN / ED COURSE  Mass of Right Groin/Suprapubic Area, Right Inguinal Hernia:  Definitely not an abscess CBC and CMET today normal CT abdomen/pelvis with contrast for further evalutation Tramadol 50 mg given in ER RX for Tramadol 50 mg TID prn Advised to follow up with general surgery    I reviewed the patient's prescription history over the last 12 months in  the multi-state controlled substances database(s) that includes [place], [place], [place], [place], [place], [place], [place], [place], [place], [place], [place], [place], [place], and [place].  Results were notable for no controlled substances filled. ____________________________________________  FINAL CLINICAL IMPRESSION(S) / ED DIAGNOSES  Final diagnoses:  Right groin mass  Right inguinal hernia   Webb Silversmith, NP    Jearld Fenton, NP 04/04/18 [place]    Lisa Roca, MD 04/04/18 [phone]

## 2018-04-04 NOTE — ED Notes (Signed)
See triage note  Presents with family  States she noticed some pain to right groin area yesterday  Pain increased this am  No redness noted to area  Hard ,painful knot felt

## 2018-04-04 NOTE — Discharge Instructions (Addendum)
You have been diagnosed with a right inguinal hernia.  I have given your prescription for tramadol 50 mg every 8 hours as needed for pain.  You need to make an appointment with general surgery for surgical evaluation.

## 2018-04-04 NOTE — ED Triage Notes (Signed)
Patient to triage with complaint of a "knot" to right groin that started yesterday.

## 2018-04-05 ENCOUNTER — Encounter: Payer: Self-pay | Admitting: Surgery

## 2018-04-05 ENCOUNTER — Ambulatory Visit: Payer: Self-pay | Admitting: Surgery

## 2018-04-05 DIAGNOSIS — K52831 Collagenous colitis: Secondary | ICD-10-CM | POA: Diagnosis not present

## 2018-04-05 DIAGNOSIS — Z720 Tobacco use: Secondary | ICD-10-CM | POA: Diagnosis not present

## 2018-04-05 DIAGNOSIS — Z7409 Other reduced mobility: Secondary | ICD-10-CM

## 2018-04-05 DIAGNOSIS — Z01818 Encounter for other preprocedural examination: Secondary | ICD-10-CM | POA: Diagnosis not present

## 2018-04-05 DIAGNOSIS — K4030 Unilateral inguinal hernia, with obstruction, without gangrene, not specified as recurrent: Secondary | ICD-10-CM | POA: Diagnosis not present

## 2018-04-05 DIAGNOSIS — G35 Multiple sclerosis: Secondary | ICD-10-CM | POA: Diagnosis not present

## 2018-04-05 NOTE — H&P (Signed)
JESSICAH CROLL Documented: 04/05/2018 2:34 PM Location: [place] Office Patient #: [id number] DOB: 11-19-1950 Married / Language: English / Race: White Female  History of Present Illness Adin Hector MD; 04/05/2018 3:44 PM) The patient is a 67 year old female who presents with an inguinal hernia. Note for "Inguinal hernia": ` ` ` Patient sent for surgical consultation at the request of Webb Silversmith, NP, [facility] ED  Chief Complaint: right groin mass. Painful hernia. ` ` The patient is a pleasant with numerous health issues. Collagenous colitis, GERD, multiple sclerosis, chronic low back and joint pain on chronic pain medication. Restless leg syndrome, tobacco abuse. Patient noticed a sharp painful lump in her right groin 2 days ago. Worsening pain. Went to the emergency room yesterday morning. Exam concerning for a right groin mass. CT scan noted an inguinal hernia. Prescription made for tramadol. Surgical consultation requested in an outpatient setting. Our group was not called during the ED visit, but we got the call this morning fit her into our [facility] later today.  Patient comes in today in a wheelchair with her husband. She complains of very sharp right groin pain. She threw up her tramadol. She's had some decreased appetite but no nausea or vomiting. She often has frequent loose bowel movements. Followed by nodal clinic. She thought she had Crohn's disease. Records note collagenous colitis. She had a tubal ligation. She had a transvaginal hysterectomy. No other abdominal pelvic surgeries. No urinary tract infection. She still smokes cigarettes but less than a pack a day. Normally she can walk slowly but steadily. No cardiac or pulmonary issues that she is aware of. No history of heart attack or strokes.she had chronic diarrhea initially was diagnosed with ulcer colitis. Rereview and biopsy was more consistent with collagenous colitis. Weaned off  medications. Had recurrent diarrhea. Back on sulfasalazine.  (Review of systems as stated in this history (HPI) or in the review of systems. Otherwise all other 12 point ROS are negative) ` ` `CLINICAL DATA: Right groin mass since yesterday.  EXAM: CT ABDOMEN AND PELVIS WITH CONTRAST  TECHNIQUE: Multidetector CT imaging of the abdomen and pelvis was performed using the standard protocol following bolus administration of intravenous contrast.  CONTRAST: 153m OMNIPAQUE IOHEXOL 300 MG/ML SOLN  COMPARISON: None.  FINDINGS: Lower chest: Minimal bibasilar atelectasis or scarring.  Hepatobiliary: Tiny liver cyst. Normal appearing gallbladder.  Pancreas: Unremarkable. No pancreatic ductal dilatation or surrounding inflammatory changes.  Spleen: Normal in size without focal abnormality.  Adrenals/Urinary Tract: Adrenal glands are unremarkable. Kidneys are normal, without renal calculi, focal lesion, or hydronephrosis. Bladder is unremarkable.  Stomach/Bowel: Unremarkable stomach, small bowel and colon. No evidence of appendicitis.  Vascular/Lymphatic: Atheromatous arterial calcifications without aneurysm. No enlarged lymph nodes.  Reproductive: Status post hysterectomy. No adnexal masses.  Other: Moderately large right inguinal hernia containing fat and fluid.  Musculoskeletal: Lumbar and lower thoracic spine degenerative changes. Mild to moderate thoracolumbar scoliosis.  IMPRESSION: 1. Moderately large right inguinal hernia containing fat and fluid. 2. Otherwise, unremarkable examination.   Electronically Signed By: SClaudie ReveringM.D. On: 04/04/2018 10:28   Past Surgical History (Lars MageSpillers, CMA; 04/05/2018 2:35 PM) Colon Polyp Removal - Colonoscopy Foot Surgery Bilateral.  Diagnostic Studies History (Lars MageSpillers, CMA; 04/05/2018 2:35 PM) Colonoscopy 1-5 years ago Mammogram 1-3 years ago  Allergies (Illene Regulus CMA; 04/05/2018 2:36  PM) Codeine Phosphate *ANALGESICS - OPIOID*  Medication History (Alisha Spillers, CMA; 04/05/2018 2:38 PM) Alendronate Sodium (70MG Tablet, Oral) Active. buPROPion HCl ER (SR) (150MG  Tablet ER 12HR, Oral) Active. Gabapentin (300MG Capsule, Oral) Active. Levothyroxine Sodium (100MCG Tablet, Oral) Active. Montelukast Sodium (10MG Tablet, Oral) Active. Pantoprazole Sodium (40MG Tablet DR, Oral) Active. rOPINIRole HCl (1MG Tablet, Oral) Active. sulfaSALAzine (500MG Tablet DR, Oral) Active. traMADol HCl (50MG Tablet, Oral) Active. traZODone HCl (100MG Tablet, Oral) Active. Omega 3-6-9 (Oral) Active. K-Dur (10MEQ Tablet ER, Oral) Active. Vitamin D (2000UNIT Tablet, Oral) Active. Vitamin B12 (500MCG Tablet, Oral) Active. Medications Reconciled  Social History Illene Regulus, CMA; 04/05/2018 2:35 PM) Caffeine use Coffee. No drug use  Family History Illene Regulus, CMA; 04/05/2018 2:35 PM) Alcohol Abuse Father. Colon Cancer Mother. Depression Mother. Heart Disease Mother. Ischemic Bowel Disease Mother. Melanoma Father.  Pregnancy / Birth History Illene Regulus, CMA; 04/05/2018 2:35 PM) Age at menarche 6 years. Age of menopause >86 Gravida 3 Maternal age 25-20 Para 3  Other Problems Illene Regulus, CMA; 04/05/2018 2:35 PM) Back Pain Crohn's Disease Gastroesophageal Reflux Disease Thyroid Disease     Review of Systems ([place]; 04/05/2018 2:35 PM) Gastrointestinal Present- Excessive gas. Not Present- Abdominal Pain, Bloating, Bloody Stool, Change in Bowel Habits, Chronic diarrhea, Constipation, Difficulty Swallowing, Gets full quickly at meals, Hemorrhoids, Indigestion, Nausea, Rectal Pain and Vomiting. Musculoskeletal Present- Back Pain. Not Present- Joint Pain, Joint Stiffness, Muscle Pain, Muscle Weakness and Swelling of Extremities. Neurological Present- Decreased Memory, Headaches, Tingling and Trouble walking. Not  Present- Fainting, Numbness, Seizures, Tremor and Weakness.  Vitals (Alisha Spillers CMA; 04/05/2018 2:35 PM) 04/05/2018 2:35 PM Weight: 147 lb Height: 62in Body Surface Area: 1.68 m Body Mass Index: 26.89 kg/m  Pulse: 84 (Regular)  BP: 104/60 (Sitting, Left Arm, Standard)      Physical Exam Adin Hector MD; 04/05/2018 3:42 PM)  General Mental Status-Alert. General Appearance-Not in acute distress, Not Sickly. Orientation-Oriented X3. Hydration-Well hydrated. Voice-Normal.  Integumentary Global Assessment Upon inspection and palpation of skin surfaces of the - Axillae: non-tender, no inflammation or ulceration, no drainage. and Distribution of scalp and body hair is normal. General Characteristics Temperature - normal warmth is noted.  Head and Neck Head-normocephalic, atraumatic with no lesions or palpable masses. Face Global Assessment - atraumatic, no absence of expression. Neck Global Assessment - no abnormal movements, no bruit auscultated on the right, no bruit auscultated on the left, no decreased range of motion, non-tender. Trachea-midline. Thyroid Gland Characteristics - non-tender.  Eye Eyeball - Left-Extraocular movements intact, No Nystagmus. Eyeball - Right-Extraocular movements intact, No Nystagmus. Cornea - Left-No Hazy. Cornea - Right-No Hazy. Sclera/Conjunctiva - Left-No scleral icterus, No Discharge. Sclera/Conjunctiva - Right-No scleral icterus, No Discharge. Pupil - Left-Direct reaction to light normal. Pupil - Right-Direct reaction to light normal.  ENMT Ears Pinna - Left - no drainage observed, no generalized tenderness observed. Right - no drainage observed, no generalized tenderness observed. Nose and Sinuses External Inspection of the Nose - no destructive lesion observed. Inspection of the nares - Left - quiet respiration. Right - quiet respiration. Mouth and Throat Lips - Upper Lip - no  fissures observed, no pallor noted. Lower Lip - no fissures observed, no pallor noted. Nasopharynx - no discharge present. Oral Cavity/Oropharynx - Tongue - no dryness observed. Oral Mucosa - no cyanosis observed. Hypopharynx - no evidence of airway distress observed.  Chest and Lung Exam Inspection Movements - Normal and Symmetrical. Accessory muscles - No use of accessory muscles in breathing. Palpation Palpation of the chest reveals - Non-tender. Auscultation Breath sounds - Normal and Clear.  Cardiovascular Auscultation Rhythm - Regular. Murmurs & Other Heart  Sounds - Auscultation of the heart reveals - No Murmurs and No Systolic Clicks.  Abdomen Inspection Inspection of the abdomen reveals - No Visible peristalsis and No Abnormal pulsations. Umbilicus - No Bleeding, No Urine drainage. Palpation/Percussion Palpation and Percussion of the abdomen reveal - Soft, Non Tender, No Rebound tenderness, No Rigidity (guarding) and No Cutaneous hyperesthesia. Note: Abdomen soft. Not tender. Not distended. No distasis recti. No umbilical or other anterior abdominal wall hernias  Female Genitourinary Sexual Maturity Tanner 5 - Adult hair pattern. Note: obvious right groin mass. Ellipsoid 5 x 4 cm. Sensitive. no cellulitis. No gangrene. No abscess. No obvious hernia on the left side. No inguinal lymphadenopathy.Over 10 minutes with her partially lying down, I was able to massage and reduced the hernia back down. Rather painful during that but once reduced she felt markedly better. No vaginal bleeding nor discharge  Peripheral Vascular Upper Extremity Inspection - Left - No Cyanotic nailbeds, Not Ischemic. Right - No Cyanotic nailbeds, Not Ischemic.  Neurologic Neurologic evaluation reveals -normal attention span and ability to concentrate, able to name objects and repeat phrases. Appropriate fund of knowledge , normal sensation and normal coordination. Mental Status Affect - not angry,  not paranoid. Cranial Nerves-Normal Bilaterally. Gait-Normal.  Neuropsychiatric Mental status exam performed with findings of-able to articulate well with normal speech/language, rate, volume and coherence, thought content normal with ability to perform basic computations and apply abstract reasoning and no evidence of hallucinations, delusions, obsessions or homicidal/suicidal ideation.  Musculoskeletal Global Assessment Spine, Ribs and Pelvis - no instability, subluxation or laxity. Right Upper Extremity - no instability, subluxation or laxity.  Lymphatic Head & Neck  General Head & Neck Lymphatics: Bilateral - Description - No Localized lymphadenopathy. Axillary  General Axillary Region: Bilateral - Description - No Localized lymphadenopathy. Femoral & Inguinal  Generalized Femoral & Inguinal Lymphatics: Left - Description - No Localized lymphadenopathy. Right - Description - No Localized lymphadenopathy.    Assessment & Plan Adin Hector MD; 04/05/2018 3:42 PM)  INCARCERATED RIGHT INGUINAL HERNIA (K40.30) Impression: Patient with sharp right groin pain c/w with inguinal hernia on CT scan from yesterday emergency room visit. Came into office with painful incarcerated hernia.  I was able to gradually massage and reduce it down in the office after 10 minutes. Rather painful process, but markedly improved once reduced.  I recommend that she consider surgical repair of her inguinal hernia. Usually do laparoscopic approach. Underlay TEP Approach. If we can let this calm down, mesh repair can be done for better long-term repair. He'll like to minimize risk of infection.  She wishes to have [facility]. We will work to try and find an time of convenience but not too long.  She has limited mobility from her multiple sclerosis and smokes. Would be a good idea to get feedback from her primary care physician to see if she is medically cleared or if she  needs a formal cardiac clearance as well before anesthesia has concerns. It is not a major abdominal surgery, so hopefully they will be okay.  I noted that Tylenol and ice would be helpful for soreness & pain. Continue her usual chronic pain medications of gabapentin etc. If she has a recurrent episode of severe pain she may require emergent surgery.   PREOP - ING HERNIA - ENCOUNTER FOR PREOPERATIVE EXAMINATION FOR GENERAL SURGICAL PROCEDURE (Z01.818)  Current Plans You are being scheduled for surgery- Our schedulers will call you.  You should hear from our office's  scheduling department within 5 working days about the location, date, and time of surgery. We try to make accommodations for patient's preferences in scheduling surgery, but sometimes the OR schedule or the surgeon's schedule prevents [place] from making those accommodations.  If you have not heard from our office [phone]) in 5 working days, call the office and ask for your surgeon's nurse.  If you have other questions about your diagnosis, plan, or surgery, call the office and ask for your surgeon's nurse.  Written instructions provided The anatomy & physiology of the abdominal wall and pelvic floor was discussed. The pathophysiology of hernias in the inguinal and pelvic region was discussed. Natural history risks such as progressive enlargement, pain, incarceration, and strangulation was discussed. Contributors to complications such as smoking, obesity, diabetes, prior surgery, etc were discussed.  I feel the risks of no intervention will lead to serious problems that outweigh the operative risks; therefore, I recommended surgery to reduce and repair the hernia. I explained laparoscopic techniques with possible need for an open approach. I noted usual use of mesh to patch and/or buttress hernia repair  Risks such as bleeding, infection, abscess, need for further treatment, heart attack, death, and other risks were  discussed. I noted a good likelihood this will help address the problem. Goals of post-operative recovery were discussed as well. Possibility that this will not correct all symptoms was explained. I stressed the importance of low-impact activity, aggressive pain control, avoiding constipation, & not pushing through pain to minimize risk of post-operative chronic pain or injury. Possibility of reherniation was discussed. We will work to minimize complications.  An educational handout further explaining the pathology & treatment options was given as well. Questions were answered. The patient expresses understanding & wishes to proceed with surgery.  Pt Education - Pamphlet Given - Laparoscopic Hernia Repair: discussed with patient and provided information. Pt Education - [facility] Pain Control (Tiahna Cure) Pt Education - [facility] Hernia Post-Op HCI (Celicia Minahan): discussed with patient and provided information. Pt Education - [facility] Mesh education: discussed with patient and provided information.  TOBACCO ABUSE (Z72.0) Impression: STOP SMOKING!  We talked to the patient about the dangers of smoking. We stressed that tobacco use dramatically increases the risk of peri-operative complications such as infection, tissue necrosis leaving to problems with incision/wound and organ healing, hernia, chronic pain, heart attack, stroke, DVT, pulmonary embolism, and death. We noted there are programs in our community to help stop smoking. Information was available.  Current Plans Pt Education - [facility] STOP SMOKING!  MULTIPLE SCLEROSIS (G35) Impression: no recent issues as far as I can tell. Somewhat limited mobility due to severe chronic back pain but not necessary for multiple sclerosis.   COLLAGENOUS COLITIS (K52.831) Impression: no major issues. No abscesses or fistulas. Stable on sulfasalazine  Adin Hector, MD, FACS, MASCRS Gastrointestinal and Minimally Invasive Surgery    [address] [place], [place] [address] [phone] Main / Paging [phone] Fax

## 2018-04-09 ENCOUNTER — Ambulatory Visit (INDEPENDENT_AMBULATORY_CARE_PROVIDER_SITE_OTHER): Payer: Medicare Other | Admitting: Family Medicine

## 2018-04-09 ENCOUNTER — Encounter: Payer: Self-pay | Admitting: Family Medicine

## 2018-04-09 VITALS — BP 130/80 | HR 80 | Ht 62.0 in | Wt 145.0 lb

## 2018-04-09 DIAGNOSIS — Z23 Encounter for immunization: Secondary | ICD-10-CM

## 2018-04-09 DIAGNOSIS — Z720 Tobacco use: Secondary | ICD-10-CM

## 2018-04-09 DIAGNOSIS — Z01818 Encounter for other preprocedural examination: Secondary | ICD-10-CM

## 2018-04-09 DIAGNOSIS — K4090 Unilateral inguinal hernia, without obstruction or gangrene, not specified as recurrent: Secondary | ICD-10-CM

## 2018-04-09 NOTE — Progress Notes (Signed)
Date:  04/09/2018   Name:  Brooke Juarez   DOB:  Aug 23, 1950   MRN:  [id number]   Chief Complaint: Pre-op Exam (hernia ) Patient is a 67 year old female who presents for a comprehensive physical exam. The patient reports the following problems: inguinal hernia. Health maintenance has been reviewed up to date.    Review of Systems  Constitutional: Negative.  Negative for chills, fatigue, fever and unexpected weight change.  HENT: Negative for congestion, ear discharge, ear pain, rhinorrhea, sinus pressure, sneezing and sore throat.   Eyes: Negative for photophobia, pain, discharge, redness and itching.  Respiratory: Negative for cough, shortness of breath, wheezing and stridor.   Gastrointestinal: Negative for abdominal pain, blood in stool, constipation, diarrhea, nausea and vomiting.  Endocrine: Negative for cold intolerance, heat intolerance, polydipsia, polyphagia and polyuria.  Genitourinary: Negative for dysuria, flank pain, frequency, hematuria, menstrual problem, pelvic pain, urgency, vaginal bleeding and vaginal discharge.  Musculoskeletal: Negative for arthralgias, back pain and myalgias.  Skin: Negative for rash.  Allergic/Immunologic: Negative for environmental allergies and food allergies.  Neurological: Negative for dizziness, weakness, light-headedness, numbness and headaches.  Hematological: Negative for adenopathy. Does not bruise/bleed easily.  Psychiatric/Behavioral: Negative for dysphoric mood. The patient is not nervous/anxious.     Patient Active Problem List   Diagnosis Date Noted  . Limited mobility with chronic pain & MS 04/05/2018  . Tobacco abuse 04/05/2018  . MS (multiple sclerosis) ([place])   . Incarcerated right inguinal hernia 04/04/2018  . Hypokalemia 11/25/2017  . Seasonal allergic rhinitis due to pollen 12/08/2016  . CAD (coronary artery disease) 11/27/2014  . Medicare annual wellness visit, subsequent 11/27/2014  . Vitamin D deficiency 11/22/2014    . Familial multiple lipoprotein-type hyperlipidemia 09/29/2014  . Clinical depression 09/29/2014  . Acid reflux 09/29/2014  . Routine general medical examination at a health care facility 09/29/2014  . Restless leg 09/29/2014  . DS (disseminated sclerosis) ([place]) 09/29/2014  . Nicotine addiction 09/29/2014  . OP (osteoporosis) 09/29/2014  . Adult hypothyroidism 10/05/2013  . Collagenous colitis 10/05/2013  . Hypothyroid 10/05/2013    Allergies  Allergen Reactions  . Codeine     Past Surgical History:  Procedure Laterality Date  . CATARACT EXTRACTION W/PHACO Right 10/30/2014   Procedure: CATARACT EXTRACTION PHACO AND INTRAOCULAR LENS PLACEMENT ([place]);  Surgeon: Estill Cotta, MD;  Location: [facility] ORS;  Service: Ophthalmology;  Laterality: Right;  [place]  1:55.7   . CATARACT EXTRACTION W/PHACO Left 06/18/2015   Procedure: CATARACT EXTRACTION PHACO AND INTRAOCULAR LENS PLACEMENT ([facility]);  Surgeon: Estill Cotta, MD;  Location: [facility] ORS;  Service: Ophthalmology;  Laterality: Left;  [place] 01:36 AP% 26.1 CDE 42.19 fluid pack lo0t # [id number] H  . COLONOSCOPY  2014   normal- Rein- repeat in 5 years  . EYE SURGERY    . FOOT SURGERY    . NASAL SINUS SURGERY    . TONSILLECTOMY    . TUBAL LIGATION    . VAGINAL HYSTERECTOMY      Social History   Tobacco Use  . Smoking status: Current Every Day Smoker    Packs/day: 0.50    Years: 55.00    Pack years: 27.50    Types: Cigarettes  . Smokeless tobacco: Never Used  . Tobacco comment: takes Wellbutrin; continuing to reduce # of cigarettes smoked; Referred to Burgess Estelle, RN  Substance Use Topics  . Alcohol use: No    Alcohol/week: 0.0 standard drinks  . Drug use: No     Medication  list has been reviewed and updated.  Current Meds  Medication Sig  . alendronate (FOSAMAX) 70 MG tablet Take 1 tablet (70 mg total) by mouth once a week. Take with a full glass of water on an empty stomach.  . Cholecalciferol (VITAMIN D) 2000 UNITS CAPS  Take 1 capsule (2,000 Units total) by mouth daily.  [name] gabapentin (NEURONTIN) 600 MG tablet Take 600 mg by mouth 2 (two) times daily. Dr [name]  . levothyroxine (SYNTHROID, LEVOTHROID) 100 MCG tablet TAKE ONE TABLET BY MOUTH ONCE DAILY. NEEDS APPOINTMENT  . montelukast (SINGULAIR) 10 MG tablet Take 1 tablet (10 mg total) by mouth at bedtime.  . OMEGA 3-6-9 FATTY ACIDS PO Take by mouth.  . pantoprazole (PROTONIX) 40 MG tablet Take 1 tablet (40 mg total) by mouth daily.  . potassium chloride (K-DUR) 10 MEQ tablet Take 1 tablet (10 mEq total) by mouth daily.  [name] rOPINIRole (REQUIP) 1 MG tablet Take 1 tablet (1 mg total) by mouth daily.  [name] sulfaSALAzine (AZULFIDINE) 500 MG EC tablet Micheele Johnson/ GI  . traZODone (DESYREL) 100 MG tablet Take 1 tablet (100 mg total) by mouth daily.  . vitamin B-12 (CYANOCOBALAMIN) 500 MCG tablet Take 500 mcg by mouth daily.  . [DISCONTINUED] buPROPion (WELLBUTRIN SR) 150 MG 12 hr tablet Take 1 tablet by mouth 2 (two) times daily.    PHQ 2/9 Scores 11/25/2017 07/16/2017 12/08/2016 12/03/2015  PHQ - 2 Score 0 2 0 0  PHQ- 9 Score 0 8 - -    Physical Exam  Constitutional: She is oriented to person, place, and time. She appears well-developed and well-nourished.  HENT:  Head: Normocephalic.  Right Ear: External ear normal.  Left Ear: External ear normal.  Mouth/Throat: Oropharynx is clear and moist.  Eyes: Pupils are equal, round, and reactive to light. Conjunctivae and EOM are normal. Lids are everted and swept, no foreign bodies found. Left eye exhibits no hordeolum. No foreign body present in the left eye. Right conjunctiva is not injected. Left conjunctiva is not injected. No scleral icterus.  Neck: Normal range of motion. Neck supple. No JVD present. No tracheal deviation present. No thyromegaly present.  Cardiovascular: Normal rate, regular rhythm, normal heart sounds and intact distal pulses. Exam reveals no gallop and no friction rub.  No murmur  heard. Pulmonary/Chest: Effort normal and breath sounds normal. No respiratory distress. She has no wheezes. She has no rales.  Abdominal: Soft. Bowel sounds are normal. She exhibits no mass. There is no hepatosplenomegaly. There is no tenderness. There is no rebound and no guarding.  Musculoskeletal: Normal range of motion. She exhibits no edema or tenderness.  Lymphadenopathy:    She has no cervical adenopathy.  Neurological: She is alert and oriented to person, place, and time. She has normal strength. She displays normal reflexes. No cranial nerve deficit.  Skin: Skin is warm. Capillary refill takes less than 2 seconds. No rash noted. No erythema.  Psychiatric: She has a normal mood and affect. Her mood appears not anxious. She does not exhibit a depressed mood.  Nursing note and vitals reviewed.   BP 130/80   Pulse 80   Ht 5' 2"  (1.575 m)   Wt 145 lb (65.8 kg)   LMP  (LMP Unknown) Comment: had hysterectomy  BMI 26.52 kg/m   Assessment and Plan: 1. Preop examination Having surgery for Right inguinal hernia/ EKG performed/Stable MS/  - EKG 12-Lead I have reviewed EKG which shows overall low voltage with no ischemic changes/intervals normal/a  nd no hypertrophy noted. No  previous EKG for comparision. 2. Tobacco abuse Smoking cessation discussed/Patient has been advised of the health risks of smoking and counseled concerning cessation of tobacco products. I spent over 3 minutes for discussion and to answer questions.  3. Influenza vaccine needed Discussed and administered - Flu vaccine HIGH DOSE PF  4. Unilateral inguinal hernia without obstruction or gangrene, recurrence not specified Called Dr Johney Maine' office and spoke with Abigail Butts. Dr. Johney Maine is "booked out until after the first of the year." The message was passed to the pt. That "If she is in a lot of pain, she needs to go to the ER." per Abigail Butts. We will fax notes and EKG to Dr Johney Maine' office. Pt is cleared for hernia surgery. Proceed  with surgery    Dr. Otilio Miu [facility]  04/09/2018

## 2018-04-12 ENCOUNTER — Telehealth: Payer: Self-pay

## 2018-04-12 NOTE — Telephone Encounter (Signed)
Spoke with patient. She would like her surgery scheduled here. She is on the schedule 04/13/18 to talk to [place].

## 2018-04-13 ENCOUNTER — Telehealth: Payer: Self-pay

## 2018-04-13 ENCOUNTER — Ambulatory Visit (INDEPENDENT_AMBULATORY_CARE_PROVIDER_SITE_OTHER): Payer: Medicare Other | Admitting: Surgery

## 2018-04-13 ENCOUNTER — Other Ambulatory Visit: Payer: Self-pay

## 2018-04-13 ENCOUNTER — Encounter: Payer: Self-pay | Admitting: Surgery

## 2018-04-13 ENCOUNTER — Ambulatory Visit: Payer: Self-pay | Admitting: Surgery

## 2018-04-13 VITALS — BP 132/66 | HR 61 | Temp 97.0°F | Resp 16 | Ht 62.5 in | Wt 145.0 lb

## 2018-04-13 DIAGNOSIS — K4030 Unilateral inguinal hernia, with obstruction, without gangrene, not specified as recurrent: Secondary | ICD-10-CM

## 2018-04-13 MED ORDER — CEFAZOLIN SODIUM-DEXTROSE 2-4 GM/100ML-% IV SOLN
2.0000 g | INTRAVENOUS | Status: AC
  Administered 2018-04-14: 2 g via INTRAVENOUS

## 2018-04-13 MED ORDER — CHLORHEXIDINE GLUCONATE CLOTH 2 % EX PADS: 6.0000 | MEDICATED_PAD | Freq: Once | CUTANEOUS | Status: DC

## 2018-04-13 MED ORDER — BUPIVACAINE LIPOSOME 1.3 % IJ SUSP: 20.0000 mL | Freq: Once | INTRAMUSCULAR | Status: DC

## 2018-04-13 MED ORDER — ACETAMINOPHEN 500 MG PO TABS
1000.0000 mg | ORAL_TABLET | ORAL | Status: AC
  Administered 2018-04-14: 1000 mg via ORAL

## 2018-04-13 MED ORDER — GABAPENTIN 300 MG PO CAPS
600.0000 mg | ORAL_CAPSULE | ORAL | Status: DC
  Administered 2018-04-14: 600 mg via ORAL

## 2018-04-13 MED ORDER — CHLORHEXIDINE GLUCONATE CLOTH 2 % EX PADS
6.0000 | MEDICATED_PAD | Freq: Once | CUTANEOUS | Status: AC
  Administered 2018-04-14: 6 via TOPICAL

## 2018-04-13 MED ORDER — CEFAZOLIN SODIUM-DEXTROSE 2-4 GM/100ML-% IV SOLN: 2.0000 g | INTRAVENOUS | Status: DC

## 2018-04-13 NOTE — Patient Instructions (Signed)
You have chose to have your hernia repaired. This will be done by Dr. Olean Ree on 04/14/18 at [facility].    You will need to arrange to be out of work for 2 weeks and then return with a lifting restrictions for 4 more weeks. Please send any FMLA paperwork prior to surgery and we will fill this out and fax it back to your employer within 3 business days.  You may have a bruise in your groin and also swelling and brusing in your testicle area. You may use ice 4-5 times daily for 15-20 minutes each time. Make sure that you place a barrier between you and the ice pack. To decrease the swelling, you may roll up a bath towel and place it vertically in between your thighs with your testicles resting on the towel. You will want to keep this area elevated as much as possible for several days following surgery.    Inguinal Hernia, Adult Muscles help keep everything in the body in its proper place. But if a weak spot in the muscles develops, something can poke through. That is called a hernia. When this happens in the lower part of the belly (abdomen), it is called an inguinal hernia. (It takes its name from a part of the body in this region called the inguinal canal.) A weak spot in the wall of muscles lets some fat or part of the small intestine bulge through. An inguinal hernia can develop at any age. Men get them more often than women. CAUSES  In adults, an inguinal hernia develops over time.  It can be triggered by:  Suddenly straining the muscles of the lower abdomen.  Lifting heavy objects.  Straining to have a bowel movement. Difficult bowel movements (constipation) can lead to this.  Constant coughing. This may be caused by smoking or lung disease.  Being overweight.  Being pregnant.  Working at a job that requires long periods of standing or heavy lifting.  Having had an inguinal hernia before. One type can be an emergency situation. It is called a strangulated inguinal hernia. It  develops if part of the small intestine slips through the weak spot and cannot get back into the abdomen. The blood supply can be cut off. If that happens, part of the intestine may die. This situation requires emergency surgery. SYMPTOMS  Often, a small inguinal hernia has no symptoms. It is found when a healthcare provider does a physical exam. Larger hernias usually have symptoms.   In adults, symptoms may include:  A lump in the groin. This is easier to see when the person is standing. It might disappear when lying down.  In men, a lump in the scrotum.  Pain or burning in the groin. This occurs especially when lifting, straining or coughing.  A dull ache or feeling of pressure in the groin.  Signs of a strangulated hernia can include:  A bulge in the groin that becomes very painful and tender to the touch.  A bulge that turns red or purple.  Fever, nausea and vomiting.  Inability to have a bowel movement or to pass gas. DIAGNOSIS  To decide if you have an inguinal hernia, a healthcare provider will probably do a physical examination.  This will include asking questions about any symptoms you have noticed.  The healthcare provider might feel the groin area and ask you to cough. If an inguinal hernia is felt, the healthcare provider may try to slide it back into the abdomen.  Usually no other tests are needed. TREATMENT  Treatments can vary. The size of the hernia makes a difference. Options include:  Watchful waiting. This is often suggested if the hernia is small and you have had no symptoms.  No medical procedure will be done unless symptoms develop.  You will need to watch closely for symptoms. If any occur, contact your healthcare provider right away.  Surgery. This is used if the hernia is larger or you have symptoms.  Open surgery. This is usually an outpatient procedure (you will not stay overnight in a hospital). An cut (incision) is made through the skin in the  groin. The hernia is put back inside the abdomen. The weak area in the muscles is then repaired by herniorrhaphy or hernioplasty. Herniorrhaphy: in this type of surgery, the weak muscles are sewn back together. Hernioplasty: a patch or mesh is used to close the weak area in the abdominal wall.  Laparoscopy. In this procedure, a surgeon makes small incisions. A thin tube with a tiny video camera (called a laparoscope) is put into the abdomen. The surgeon repairs the hernia with mesh by looking with the video camera and using two long instruments. HOME CARE INSTRUCTIONS   After surgery to repair an inguinal hernia:  You will need to take pain medicine prescribed by your healthcare provider. Follow all directions carefully.  You will need to take care of the wound from the incision.  Your activity will be restricted for awhile. This will probably include no heavy lifting for several weeks. You also should not do anything too active for a few weeks. When you can return to work will depend on the type of job that you have.  During "watchful waiting" periods, you should:  Maintain a healthy weight.  Eat a diet high in fiber (fruits, vegetables and whole grains).  Drink plenty of fluids to avoid constipation. This means drinking enough water and other liquids to keep your urine clear or pale yellow.  Do not lift heavy objects.  Do not stand for long periods of time.  Quit smoking. This should keep you from developing a frequent cough. SEEK MEDICAL CARE IF:   A bulge develops in your groin area.  You feel pain, a burning sensation or pressure in the groin. This might be worse if you are lifting or straining.  You develop a fever of more than 100.5 F (38.1 C). SEEK IMMEDIATE MEDICAL CARE IF:   Pain in the groin increases suddenly.  A bulge in the groin gets bigger suddenly and does not go down.  For men, there is sudden pain in the scrotum. Or, the size of the scrotum increases.  A  bulge in the groin area becomes red or purple and is painful to touch.  You have nausea or vomiting that does not go away.  You feel your heart beating much faster than normal.  You cannot have a bowel movement or pass gas.  You develop a fever of more than 102.0 F (38.9 C).   This information is not intended to replace advice given to you by your health care provider. Make sure you discuss any questions you have with your health care provider.   Document Released: 09/21/2008 Document Revised: 07/28/2011 Document Reviewed: 11/06/2014 Elsevier Interactive Patient Education [employer].

## 2018-04-13 NOTE — Progress Notes (Signed)
04/13/2018  Reason for Visit:  Incarcerated right inguinal hernia  Referring Provider:  Otilio Miu, MD  History of Present Illness: Brooke Juarez is a 67 y.o. female who presented to the ED on 04/04/18 with 1 day history of acute onset right groin pain.  She reports that the pain started on 11/16 and would not improve and in fact it worsened.  She had overall normal labs with WBC of 10, and had a CT scan showing a right inguinal hernia containing fat and fluid.  The patient reports that no attempt was made to reduce her hernia.  She was referred to [facility] and Dr. Johney Maine saw her on 11/18 and after 10 minutes, was able to reduce her hernia.  The patient reports that it was very tender during this, but afterwards felt much better.  Since then, she reports some soreness and burning sensation but no significant pain like what brought her to the ED.  The soreness is worse depending on her right leg position.  Denies any current nausea, vomiting, abdominal pain, constipation, diarrhea, blood in her stool.  Dr. Johney Maine had scheduled her for 12/11 at [facility], but she would rather not have surgery in [facility] and would rather have it sooner so a new referral was made to see [place].  Of note, she has a history of multiple sclerosis and Crohn's disease.  She had a tubal ligation and partial hysterectomy but no other abdominal surgery.  Past Medical History: Past Medical History:  Diagnosis Date  . Crohn disease ([place])   . Depression   . GERD (gastroesophageal reflux disease)   . Heart murmur   . Hyperlipidemia   . Hypothyroidism   . MS (multiple sclerosis) ([place])   . Restless leg   . Seizures ([place])   . Tremors of nervous system      Past Surgical History: Past Surgical History:  Procedure Laterality Date  . CATARACT EXTRACTION W/PHACO Right 10/30/2014   Procedure: CATARACT EXTRACTION PHACO AND INTRAOCULAR LENS PLACEMENT ([place]);  Surgeon: Estill Cotta, MD;  Location: [facility] ORS;   Service: Ophthalmology;  Laterality: Right;  [place]  1:55.7   . CATARACT EXTRACTION W/PHACO Left 06/18/2015   Procedure: CATARACT EXTRACTION PHACO AND INTRAOCULAR LENS PLACEMENT (IOC);  Surgeon: Estill Cotta, MD;  Location: [facility] ORS;  Service: Ophthalmology;  Laterality: Left;  [place] 01:36 AP% 26.1 CDE 42.19 fluid pack lo0t # [id number]  . COLONOSCOPY  2014   normal- Rein- repeat in 5 years  . EYE SURGERY    . FOOT SURGERY    . NASAL SINUS SURGERY    . TONSILLECTOMY    . TUBAL LIGATION    . VAGINAL HYSTERECTOMY      Home Medications: Prior to Admission medications   Medication Sig Start Date End Date Taking? Authorizing Provider  alendronate (FOSAMAX) 70 MG tablet Take 1 tablet (70 mg total) by mouth once a week. Take with a full glass of water on an empty stomach. 11/25/17  Yes Juline Patch, MD  Cholecalciferol (VITAMIN D) 2000 UNITS CAPS Take 1 capsule (2,000 Units total) by mouth daily. 11/22/14  Yes Plonk, Gwyndolyn Saxon, MD  gabapentin (NEURONTIN) 600 MG tablet Take 600 mg by mouth 2 (two) times daily. Dr [name]   Yes [provider]  levothyroxine (SYNTHROID, LEVOTHROID) 100 MCG tablet TAKE ONE TABLET BY MOUTH ONCE DAILY. NEEDS APPOINTMENT 11/25/17  Yes Juline Patch, MD  montelukast (SINGULAIR) 10 MG tablet Take 1 tablet (10 mg total) by mouth at bedtime.  11/25/17  Yes Juline Patch, MD  OMEGA 3-6-9 FATTY ACIDS PO Take by mouth.   Yes [provider]  pantoprazole (PROTONIX) 40 MG tablet Take 1 tablet (40 mg total) by mouth daily. 11/25/17  Yes Juline Patch, MD  potassium chloride (K-DUR) 10 MEQ tablet Take 1 tablet (10 mEq total) by mouth daily. 11/25/17 11/25/18 Yes Juline Patch, MD  rOPINIRole (REQUIP) 1 MG tablet Take 1 tablet (1 mg total) by mouth daily. 11/25/17  Yes Juline Patch, MD  sulfaSALAzine (AZULFIDINE) 500 MG EC tablet Micheele Johnson/ GI 10/14/17  Yes [provider]  traMADol (ULTRAM) 50 MG tablet Take 1 tablet (50 mg total) by mouth every 8  (eight) hours as needed. 04/04/18  Yes Jearld Fenton, NP  traZODone (DESYREL) 100 MG tablet Take 1 tablet (100 mg total) by mouth daily. 11/25/17  Yes Juline Patch, MD  vitamin B-12 (CYANOCOBALAMIN) 500 MCG tablet Take 500 mcg by mouth daily.   Yes [provider]    Allergies: Allergies  Allergen Reactions  . Codeine     Social History:  reports that she has been smoking cigarettes. She has a 27.50 pack-year smoking history. She has never used smokeless tobacco. She reports that she does not drink alcohol or use drugs.   Family History: Family History  Problem Relation Age of Onset  . Cancer Mother   . Heart disease Mother   . Stroke Mother   . Stroke Father   . Breast cancer Neg Hx     Review of Systems: Review of Systems  Constitutional: Negative for chills and fever.  HENT: Negative for hearing loss.   Respiratory: Negative for shortness of breath.   Cardiovascular: Negative for chest pain.  Gastrointestinal: Positive for abdominal pain (soreness and burning at right groin). Negative for constipation, diarrhea, nausea and vomiting.  Genitourinary: Negative for dysuria.  Musculoskeletal: Negative for myalgias.  Skin: Negative for rash.  Neurological: Negative for dizziness.  Psychiatric/Behavioral: Negative for depression.    Physical Exam BP 132/66   Pulse 61   Temp (!) 97 F (36.1 C) (Oral)   Resp 16   Ht 5' 2.5" (1.588 m)   Wt 145 lb (65.8 kg)   LMP  (LMP Unknown) Comment: had hysterectomy  SpO2 96%   BMI 26.10 kg/m  CONSTITUTIONAL: No acute distress HEENT:  Normocephalic, atraumatic, extraocular motion intact. NECK: Trachea is midline, and there is no jugular venous distension.  RESPIRATORY:  Lungs are clear, and breath sounds are equal bilaterally. Normal respiratory effort without pathologic use of accessory muscles. CARDIOVASCULAR: Heart is regular without murmurs, gallops, or rubs. GI: The abdomen is soft, nondistended, nontender to  palpation except at the right groin.  The right groin shows an incarcerated right inguinal hernia, small lump that is currently firm to touch.  There is tenderness in trying to reduce this, and I am unable to reduce it due to discomfort.  There is no overlying skin discoloration or edema.  MUSCULOSKELETAL:  Normal muscle strength and tone in all four extremities.  No peripheral edema or cyanosis. SKIN: Skin turgor is normal. There are no pathologic skin lesions.  NEUROLOGIC:  Motor and sensation is grossly normal.  Cranial nerves are grossly intact. PSYCH:  Alert and oriented to person, place and time. Affect is normal.  Laboratory Analysis: Labs 04/04/18: Na 140, K 4.0, Cl 103, CO2 28, BUN 7, Cr 0.81.  WBC 10, Hgb 12.5, Hct 39.7, Plt 152  Imaging: CT abd/pelvis  04/04/18: IMPRESSION: 1. Moderately large right inguinal hernia containing fat and fluid. 2. Otherwise, unremarkable examination.  Assessment and Plan: This is a 67 y.o. female with an incarcerated right inguinal hernia.  I have independently viewed the patient's imaging study and reviewed her laboratory studies.  Her CT scan does show a moderate size right inguinal hernia with fat and surrounding fluid, which could be due to the incarceration.  Her labs were normal.  I would not be surprised if there is actually a component of strangulation based on the patient's symptoms.  Her hernia was able to be reduced by Dr. Johney Maine but the patient would like to have her surgery sooner and in [place].  Given that I am not able to reduce it at bedside due to tenderness, I would not want to wait either and will schedule her for surgery tomorrow afternoon.  Discussed with her that this would be an open approach rather than via laparoscopy, and depending on the tissue, we may or may not be able to place mesh.  She understands that without mesh, there is a likelihood that her hernia could recur, but if the tissue looks compromised, placing mesh would  only hurt more than help.  Risks of bleeding, infection, and injury to surrounding structures were discussed.  She understands this and is willing to proceed.  Strict return precautions were given to the patient, particularly if there is any worsening pain tonight in the right groin, she should come to the ER right away to be seen.  Face-to-face time spent with the patient and care providers was 60 minutes, with more than 50% of the time spent counseling, educating, and coordinating care of the patient.     Melvyn Neth, [facility]

## 2018-04-13 NOTE — Telephone Encounter (Signed)
The patient is scheduled for surgery at [facility] with Dr Hampton Abbot on 04/14/18. She will arrive at the Registration desk in the [place] at 12:30 pm. She will have nothing to eat after midnight the night prior and may have clear liquids up until 10:30 am the same day. Medications for the morning reviewed with patient. She is aware of date, time, and instructions.

## 2018-04-13 NOTE — H&P (View-Only) (Signed)
04/13/2018  Reason for Visit:  Incarcerated right inguinal hernia  Referring Provider:  Otilio Miu, MD  History of Present Illness: Brooke Juarez is a 67 y.o. female who presented to the ED on 04/04/18 with 1 day history of acute onset right groin pain.  She reports that the pain started on 11/16 and would not improve and in fact it worsened.  She had overall normal labs with WBC of 10, and had a CT scan showing a right inguinal hernia containing fat and fluid.  The patient reports that no attempt was made to reduce her hernia.  She was referred to [facility] and Dr. Johney Maine saw her on 11/18 and after 10 minutes, was able to reduce her hernia.  The patient reports that it was very tender during this, but afterwards felt much better.  Since then, she reports some soreness and burning sensation but no significant pain like what brought her to the ED.  The soreness is worse depending on her right leg position.  Denies any current nausea, vomiting, abdominal pain, constipation, diarrhea, blood in her stool.  Dr. Johney Maine had scheduled her for 12/11 at [facility], but she would rather not have surgery in [facility] and would rather have it sooner so a new referral was made to see [place].  Of note, she has a history of multiple sclerosis and Crohn's disease.  She had a tubal ligation and partial hysterectomy but no other abdominal surgery.  Past Medical History: Past Medical History:  Diagnosis Date  . Crohn disease ([place])   . Depression   . GERD (gastroesophageal reflux disease)   . Heart murmur   . Hyperlipidemia   . Hypothyroidism   . MS (multiple sclerosis) ([place])   . Restless leg   . Seizures ([place])   . Tremors of nervous system      Past Surgical History: Past Surgical History:  Procedure Laterality Date  . CATARACT EXTRACTION W/PHACO Right 10/30/2014   Procedure: CATARACT EXTRACTION PHACO AND INTRAOCULAR LENS PLACEMENT ([place]);  Surgeon: Estill Cotta, MD;  Location: [facility] ORS;   Service: Ophthalmology;  Laterality: Right;  [place]  1:55.7   . CATARACT EXTRACTION W/PHACO Left 06/18/2015   Procedure: CATARACT EXTRACTION PHACO AND INTRAOCULAR LENS PLACEMENT (IOC);  Surgeon: Estill Cotta, MD;  Location: [facility] ORS;  Service: Ophthalmology;  Laterality: Left;  [place] 01:36 AP% 26.1 CDE 42.19 fluid pack lo0t # [id number]  . COLONOSCOPY  2014   normal- Rein- repeat in 5 years  . EYE SURGERY    . FOOT SURGERY    . NASAL SINUS SURGERY    . TONSILLECTOMY    . TUBAL LIGATION    . VAGINAL HYSTERECTOMY      Home Medications: Prior to Admission medications   Medication Sig Start Date End Date Taking? Authorizing Provider  alendronate (FOSAMAX) 70 MG tablet Take 1 tablet (70 mg total) by mouth once a week. Take with a full glass of water on an empty stomach. 11/25/17  Yes Juline Patch, MD  Cholecalciferol (VITAMIN D) 2000 UNITS CAPS Take 1 capsule (2,000 Units total) by mouth daily. 11/22/14  Yes Plonk, Gwyndolyn Saxon, MD  gabapentin (NEURONTIN) 600 MG tablet Take 600 mg by mouth 2 (two) times daily. Dr [name]   Yes [provider]  levothyroxine (SYNTHROID, LEVOTHROID) 100 MCG tablet TAKE ONE TABLET BY MOUTH ONCE DAILY. NEEDS APPOINTMENT 11/25/17  Yes Juline Patch, MD  montelukast (SINGULAIR) 10 MG tablet Take 1 tablet (10 mg total) by mouth at bedtime.  11/25/17  Yes Juline Patch, MD  OMEGA 3-6-9 FATTY ACIDS PO Take by mouth.   Yes [provider]  pantoprazole (PROTONIX) 40 MG tablet Take 1 tablet (40 mg total) by mouth daily. 11/25/17  Yes Juline Patch, MD  potassium chloride (K-DUR) 10 MEQ tablet Take 1 tablet (10 mEq total) by mouth daily. 11/25/17 11/25/18 Yes Juline Patch, MD  rOPINIRole (REQUIP) 1 MG tablet Take 1 tablet (1 mg total) by mouth daily. 11/25/17  Yes Juline Patch, MD  sulfaSALAzine (AZULFIDINE) 500 MG EC tablet Micheele Johnson/ GI 10/14/17  Yes [provider]  traMADol (ULTRAM) 50 MG tablet Take 1 tablet (50 mg total) by mouth every 8  (eight) hours as needed. 04/04/18  Yes Jearld Fenton, NP  traZODone (DESYREL) 100 MG tablet Take 1 tablet (100 mg total) by mouth daily. 11/25/17  Yes Juline Patch, MD  vitamin B-12 (CYANOCOBALAMIN) 500 MCG tablet Take 500 mcg by mouth daily.   Yes [provider]    Allergies: Allergies  Allergen Reactions  . Codeine     Social History:  reports that she has been smoking cigarettes. She has a 27.50 pack-year smoking history. She has never used smokeless tobacco. She reports that she does not drink alcohol or use drugs.   Family History: Family History  Problem Relation Age of Onset  . Cancer Mother   . Heart disease Mother   . Stroke Mother   . Stroke Father   . Breast cancer Neg Hx     Review of Systems: Review of Systems  Constitutional: Negative for chills and fever.  HENT: Negative for hearing loss.   Respiratory: Negative for shortness of breath.   Cardiovascular: Negative for chest pain.  Gastrointestinal: Positive for abdominal pain (soreness and burning at right groin). Negative for constipation, diarrhea, nausea and vomiting.  Genitourinary: Negative for dysuria.  Musculoskeletal: Negative for myalgias.  Skin: Negative for rash.  Neurological: Negative for dizziness.  Psychiatric/Behavioral: Negative for depression.    Physical Exam BP 132/66   Pulse 61   Temp (!) 97 F (36.1 C) (Oral)   Resp 16   Ht 5' 2.5" (1.588 m)   Wt 145 lb (65.8 kg)   LMP  (LMP Unknown) Comment: had hysterectomy  SpO2 96%   BMI 26.10 kg/m  CONSTITUTIONAL: No acute distress HEENT:  Normocephalic, atraumatic, extraocular motion intact. NECK: Trachea is midline, and there is no jugular venous distension.  RESPIRATORY:  Lungs are clear, and breath sounds are equal bilaterally. Normal respiratory effort without pathologic use of accessory muscles. CARDIOVASCULAR: Heart is regular without murmurs, gallops, or rubs. GI: The abdomen is soft, nondistended, nontender to  palpation except at the right groin.  The right groin shows an incarcerated right inguinal hernia, small lump that is currently firm to touch.  There is tenderness in trying to reduce this, and I am unable to reduce it due to discomfort.  There is no overlying skin discoloration or edema.  MUSCULOSKELETAL:  Normal muscle strength and tone in all four extremities.  No peripheral edema or cyanosis. SKIN: Skin turgor is normal. There are no pathologic skin lesions.  NEUROLOGIC:  Motor and sensation is grossly normal.  Cranial nerves are grossly intact. PSYCH:  Alert and oriented to person, place and time. Affect is normal.  Laboratory Analysis: Labs 04/04/18: Na 140, K 4.0, Cl 103, CO2 28, BUN 7, Cr 0.81.  WBC 10, Hgb 12.5, Hct 39.7, Plt 152  Imaging: CT abd/pelvis  04/04/18: IMPRESSION: 1. Moderately large right inguinal hernia containing fat and fluid. 2. Otherwise, unremarkable examination.  Assessment and Plan: This is a 67 y.o. female with an incarcerated right inguinal hernia.  I have independently viewed the patient's imaging study and reviewed her laboratory studies.  Her CT scan does show a moderate size right inguinal hernia with fat and surrounding fluid, which could be due to the incarceration.  Her labs were normal.  I would not be surprised if there is actually a component of strangulation based on the patient's symptoms.  Her hernia was able to be reduced by Dr. Johney Maine but the patient would like to have her surgery sooner and in [place].  Given that I am not able to reduce it at bedside due to tenderness, I would not want to wait either and will schedule her for surgery tomorrow afternoon.  Discussed with her that this would be an open approach rather than via laparoscopy, and depending on the tissue, we may or may not be able to place mesh.  She understands that without mesh, there is a likelihood that her hernia could recur, but if the tissue looks compromised, placing mesh would  only hurt more than help.  Risks of bleeding, infection, and injury to surrounding structures were discussed.  She understands this and is willing to proceed.  Strict return precautions were given to the patient, particularly if there is any worsening pain tonight in the right groin, she should come to the ER right away to be seen.  Face-to-face time spent with the patient and care providers was 60 minutes, with more than 50% of the time spent counseling, educating, and coordinating care of the patient.     Melvyn Neth, [facility]

## 2018-04-14 ENCOUNTER — Other Ambulatory Visit: Payer: Self-pay

## 2018-04-14 ENCOUNTER — Ambulatory Visit
Admission: RE | Admit: 2018-04-14 | Discharge: 2018-04-14 | Disposition: A | Discharge status: Home / Self Care | Payer: Medicare Other | Source: Ambulatory Visit | Attending: Surgery | Admitting: Surgery

## 2018-04-14 ENCOUNTER — Encounter
Admission: RE | Disposition: A | Discharge status: Home / Self Care | Payer: Self-pay | Source: Ambulatory Visit | Attending: Surgery

## 2018-04-14 ENCOUNTER — Ambulatory Visit: Payer: Medicare Other | Admitting: Anesthesiology

## 2018-04-14 ENCOUNTER — Encounter: Payer: Self-pay | Admitting: Anesthesiology

## 2018-04-14 DIAGNOSIS — F329 Major depressive disorder, single episode, unspecified: Secondary | ICD-10-CM | POA: Insufficient documentation

## 2018-04-14 DIAGNOSIS — Z79890 Hormone replacement therapy (postmenopausal): Secondary | ICD-10-CM | POA: Diagnosis not present

## 2018-04-14 DIAGNOSIS — E039 Hypothyroidism, unspecified: Secondary | ICD-10-CM | POA: Insufficient documentation

## 2018-04-14 DIAGNOSIS — E785 Hyperlipidemia, unspecified: Secondary | ICD-10-CM | POA: Diagnosis not present

## 2018-04-14 DIAGNOSIS — R011 Cardiac murmur, unspecified: Secondary | ICD-10-CM | POA: Insufficient documentation

## 2018-04-14 DIAGNOSIS — Z885 Allergy status to narcotic agent status: Secondary | ICD-10-CM | POA: Diagnosis not present

## 2018-04-14 DIAGNOSIS — Z79899 Other long term (current) drug therapy: Secondary | ICD-10-CM | POA: Diagnosis not present

## 2018-04-14 DIAGNOSIS — K5090 Crohn's disease, unspecified, without complications: Secondary | ICD-10-CM | POA: Diagnosis not present

## 2018-04-14 DIAGNOSIS — G35 Multiple sclerosis: Secondary | ICD-10-CM | POA: Diagnosis not present

## 2018-04-14 DIAGNOSIS — F17210 Nicotine dependence, cigarettes, uncomplicated: Secondary | ICD-10-CM | POA: Diagnosis not present

## 2018-04-14 DIAGNOSIS — K450 Other specified abdominal hernia with obstruction, without gangrene: Secondary | ICD-10-CM | POA: Diagnosis not present

## 2018-04-14 DIAGNOSIS — K4090 Unilateral inguinal hernia, without obstruction or gangrene, not specified as recurrent: Secondary | ICD-10-CM | POA: Diagnosis not present

## 2018-04-14 DIAGNOSIS — G2581 Restless legs syndrome: Secondary | ICD-10-CM | POA: Diagnosis not present

## 2018-04-14 DIAGNOSIS — I2510 Atherosclerotic heart disease of native coronary artery without angina pectoris: Secondary | ICD-10-CM | POA: Diagnosis not present

## 2018-04-14 DIAGNOSIS — K4030 Unilateral inguinal hernia, with obstruction, without gangrene, not specified as recurrent: Secondary | ICD-10-CM

## 2018-04-14 DIAGNOSIS — K4130 Unilateral femoral hernia, with obstruction, without gangrene, not specified as recurrent: Secondary | ICD-10-CM | POA: Insufficient documentation

## 2018-04-14 DIAGNOSIS — R599 Enlarged lymph nodes, unspecified: Secondary | ICD-10-CM | POA: Insufficient documentation

## 2018-04-14 DIAGNOSIS — K219 Gastro-esophageal reflux disease without esophagitis: Secondary | ICD-10-CM | POA: Insufficient documentation

## 2018-04-14 HISTORY — PX: INGUINAL HERNIA REPAIR: SHX194

## 2018-04-14 HISTORY — PX: APPENDECTOMY: SHX54

## 2018-04-14 SURGERY — REPAIR, HERNIA, INGUINAL, ADULT
Anesthesia: General | Site: Abdomen | Laterality: Right

## 2018-04-14 MED ORDER — FENTANYL CITRATE (PF) 100 MCG/2ML IJ SOLN
INTRAMUSCULAR | Status: AC
  Filled 2018-04-14: qty 2

## 2018-04-14 MED ORDER — ROCURONIUM BROMIDE 100 MG/10ML IV SOLN
INTRAVENOUS | Status: DC | PRN
  Administered 2018-04-14: 20 mg via INTRAVENOUS
  Administered 2018-04-14: 30 mg via INTRAVENOUS
  Administered 2018-04-14: 20 mg via INTRAVENOUS

## 2018-04-14 MED ORDER — ACETAMINOPHEN 500 MG PO TABS
ORAL_TABLET | ORAL | Status: AC
  Administered 2018-04-14: 1000 mg via ORAL
  Filled 2018-04-14: qty 2

## 2018-04-14 MED ORDER — ONDANSETRON HCL 4 MG/2ML IJ SOLN
INTRAMUSCULAR | Status: DC | PRN
  Administered 2018-04-14: 4 mg via INTRAVENOUS

## 2018-04-14 MED ORDER — SUGAMMADEX SODIUM 200 MG/2ML IV SOLN
INTRAVENOUS | Status: AC
  Filled 2018-04-14: qty 2

## 2018-04-14 MED ORDER — DEXAMETHASONE SODIUM PHOSPHATE 10 MG/ML IJ SOLN
INTRAMUSCULAR | Status: AC
  Filled 2018-04-14: qty 1

## 2018-04-14 MED ORDER — CHLORHEXIDINE GLUCONATE CLOTH 2 % EX PADS: 6.0000 | MEDICATED_PAD | Freq: Once | CUTANEOUS | Status: DC

## 2018-04-14 MED ORDER — BUPIVACAINE LIPOSOME 1.3 % IJ SUSP: 20.0000 mL | Freq: Once | INTRAMUSCULAR | Status: DC

## 2018-04-14 MED ORDER — MIDAZOLAM HCL 2 MG/2ML IJ SOLN
INTRAMUSCULAR | Status: AC
  Filled 2018-04-14: qty 2

## 2018-04-14 MED ORDER — BUPIVACAINE LIPOSOME 1.3 % IJ SUSP
INTRAMUSCULAR | Status: AC
  Filled 2018-04-14: qty 20

## 2018-04-14 MED ORDER — LIDOCAINE HCL (PF) 2 % IJ SOLN
INTRAMUSCULAR | Status: AC
  Filled 2018-04-14: qty 10

## 2018-04-14 MED ORDER — ONDANSETRON HCL 4 MG/2ML IJ SOLN: 4.0000 mg | Freq: Once | INTRAMUSCULAR | Status: DC | PRN

## 2018-04-14 MED ORDER — SUGAMMADEX SODIUM 200 MG/2ML IV SOLN
INTRAVENOUS | Status: DC | PRN
  Administered 2018-04-14: 300 mg via INTRAVENOUS

## 2018-04-14 MED ORDER — PHENYLEPHRINE HCL 10 MG/ML IJ SOLN
INTRAMUSCULAR | Status: DC | PRN
  Administered 2018-04-14 (×2): 100 ug via INTRAVENOUS

## 2018-04-14 MED ORDER — OXYCODONE HCL 5 MG PO TABS: 5.0000 mg | ORAL_TABLET | Freq: Four times a day (QID) | ORAL | 0 refills | Status: DC | PRN

## 2018-04-14 MED ORDER — IBUPROFEN 600 MG PO TABS: 600.0000 mg | ORAL_TABLET | Freq: Three times a day (TID) | ORAL | 0 refills | Status: DC | PRN

## 2018-04-14 MED ORDER — FENTANYL CITRATE (PF) 100 MCG/2ML IJ SOLN: 25.0000 ug | INTRAMUSCULAR | Status: DC | PRN

## 2018-04-14 MED ORDER — FENTANYL CITRATE (PF) 100 MCG/2ML IJ SOLN
INTRAMUSCULAR | Status: DC | PRN
  Administered 2018-04-14 (×6): 50 ug via INTRAVENOUS

## 2018-04-14 MED ORDER — SODIUM CHLORIDE FLUSH 0.9 % IV SOLN
INTRAVENOUS | Status: AC
  Filled 2018-04-14: qty 10

## 2018-04-14 MED ORDER — MIDAZOLAM HCL 2 MG/2ML IJ SOLN
INTRAMUSCULAR | Status: DC | PRN
  Administered 2018-04-14: 2 mg via INTRAVENOUS

## 2018-04-14 MED ORDER — SUCCINYLCHOLINE CHLORIDE 20 MG/ML IJ SOLN
INTRAMUSCULAR | Status: AC
  Filled 2018-04-14: qty 1

## 2018-04-14 MED ORDER — ROCURONIUM BROMIDE 50 MG/5ML IV SOLN
INTRAVENOUS | Status: AC
  Filled 2018-04-14: qty 1

## 2018-04-14 MED ORDER — DEXAMETHASONE SODIUM PHOSPHATE 10 MG/ML IJ SOLN
INTRAMUSCULAR | Status: DC | PRN
  Administered 2018-04-14: 10 mg via INTRAVENOUS

## 2018-04-14 MED ORDER — TRAMADOL HCL 50 MG PO TABS
ORAL_TABLET | ORAL | Status: AC
  Administered 2018-04-14: 50 mg
  Filled 2018-04-14: qty 1

## 2018-04-14 MED ORDER — LIDOCAINE HCL (CARDIAC) PF 100 MG/5ML IV SOSY
PREFILLED_SYRINGE | INTRAVENOUS | Status: DC | PRN
  Administered 2018-04-14: 100 mg via INTRAVENOUS

## 2018-04-14 MED ORDER — PROPOFOL 10 MG/ML IV BOLUS
INTRAVENOUS | Status: AC
  Filled 2018-04-14: qty 20

## 2018-04-14 MED ORDER — ONDANSETRON HCL 4 MG/2ML IJ SOLN
INTRAMUSCULAR | Status: AC
  Filled 2018-04-14: qty 2

## 2018-04-14 MED ORDER — GABAPENTIN 300 MG PO CAPS
ORAL_CAPSULE | ORAL | Status: AC
  Filled 2018-04-14: qty 2

## 2018-04-14 MED ORDER — BUPIVACAINE HCL (PF) 0.25 % IJ SOLN
INTRAMUSCULAR | Status: AC
  Filled 2018-04-14: qty 30

## 2018-04-14 MED ORDER — PROPOFOL 10 MG/ML IV BOLUS
INTRAVENOUS | Status: DC | PRN
  Administered 2018-04-14: 130 mg via INTRAVENOUS

## 2018-04-14 MED ORDER — EPHEDRINE SULFATE 50 MG/ML IJ SOLN
INTRAMUSCULAR | Status: AC
  Filled 2018-04-14: qty 1

## 2018-04-14 MED ORDER — PHENYLEPHRINE HCL 10 MG/ML IJ SOLN
INTRAMUSCULAR | Status: AC
  Filled 2018-04-14: qty 1

## 2018-04-14 MED ORDER — SEVOFLURANE IN SOLN
RESPIRATORY_TRACT | Status: AC
  Filled 2018-04-14: qty 250

## 2018-04-14 MED ORDER — CEFAZOLIN SODIUM-DEXTROSE 2-4 GM/100ML-% IV SOLN
INTRAVENOUS | Status: AC
  Filled 2018-04-14: qty 100

## 2018-04-14 MED ORDER — EPHEDRINE SULFATE 50 MG/ML IJ SOLN
INTRAMUSCULAR | Status: DC | PRN
  Administered 2018-04-14: 10 mg via INTRAVENOUS

## 2018-04-14 MED ORDER — BUPIVACAINE HCL (PF) 0.25 % IJ SOLN
INTRAMUSCULAR | Status: DC | PRN
  Administered 2018-04-14: 30 mL

## 2018-04-14 MED ORDER — LACTATED RINGERS IV SOLN
INTRAVENOUS | Status: DC
  Administered 2018-04-14 (×2): via INTRAVENOUS

## 2018-04-14 MED ORDER — SODIUM CHLORIDE 0.9 % IV SOLN
INTRAVENOUS | Status: DC | PRN
  Administered 2018-04-14: 30 mL

## 2018-04-14 MED ORDER — GABAPENTIN 300 MG PO CAPS: 300.0000 mg | ORAL_CAPSULE | ORAL | Status: DC

## 2018-04-14 SURGICAL SUPPLY — 35 items
BLADE SURG 15 STRL LF DISP TIS (BLADE) ×2 IMPLANT
BLADE SURG 15 STRL SS (BLADE) ×2
CANISTER SUCT 1200ML W/VALVE (MISCELLANEOUS) ×4 IMPLANT
CHLORAPREP W/TINT 26ML (MISCELLANEOUS) ×4 IMPLANT
COVER WAND RF STERILE (DRAPES) ×4 IMPLANT
DERMABOND ADVANCED (GAUZE/BANDAGES/DRESSINGS) ×2
DERMABOND ADVANCED .7 DNX12 (GAUZE/BANDAGES/DRESSINGS) ×2 IMPLANT
DRAIN PENROSE 1/4X12 LTX (DRAIN) ×4 IMPLANT
DRAPE LAPAROTOMY 100X77 ABD (DRAPES) ×4 IMPLANT
ELECT CAUTERY BLADE 6.4 (BLADE) ×4 IMPLANT
ELECT REM PT RETURN 9FT ADLT (ELECTROSURGICAL) ×4
ELECTRODE REM PT RTRN 9FT ADLT (ELECTROSURGICAL) ×2 IMPLANT
GLOVE SURG SYN 7.0 (GLOVE) ×4 IMPLANT
GLOVE SURG SYN 7.5  E (GLOVE) ×2
GLOVE SURG SYN 7.5 E (GLOVE) ×2 IMPLANT
GOWN STRL REUS W/ TWL LRG LVL3 (GOWN DISPOSABLE) ×4 IMPLANT
GOWN STRL REUS W/TWL LRG LVL3 (GOWN DISPOSABLE) ×4
LABEL OR SOLS (LABEL) ×4 IMPLANT
NEEDLE HYPO 22GX1.5 SAFETY (NEEDLE) ×4 IMPLANT
NS IRRIG 500ML POUR BTL (IV SOLUTION) ×4 IMPLANT
PACK BASIN MINOR ARMC (MISCELLANEOUS) ×4 IMPLANT
SPONGE LAP 18X18 RF (DISPOSABLE) ×8 IMPLANT
STAPLER PROXIMATE 55 BLUE (STAPLE) ×4 IMPLANT
SUT MNCRL 4-0 (SUTURE) ×2
SUT MNCRL 4-0 27XMFL (SUTURE) ×2
SUT PROLENE 2 0 SH DA (SUTURE) ×16 IMPLANT
SUT SILK 2 0 SH (SUTURE) ×4 IMPLANT
SUT VIC AB 0 CT2 27 (SUTURE) ×4 IMPLANT
SUT VIC AB 2-0 CT1 (SUTURE) ×4 IMPLANT
SUT VIC AB 3-0 SH 27 (SUTURE) ×2
SUT VIC AB 3-0 SH 27X BRD (SUTURE) ×2 IMPLANT
SUTURE MNCRL 4-0 27XMF (SUTURE) ×2 IMPLANT
SYR 10ML LL (SYRINGE) ×4 IMPLANT
SYR 30ML LL (SYRINGE) ×4 IMPLANT
SYR BULB IRRIG 60ML STRL (SYRINGE) ×4 IMPLANT

## 2018-04-14 NOTE — Transfer of Care (Signed)
Immediate Anesthesia Transfer of Care Note  Patient: Brooke Juarez  Procedure(s) Performed: HERNIA REPAIR INGUINAL (Right Abdomen) APPENDECTOMY (Left Abdomen)  Patient Location: PACU  Anesthesia Type:General  Level of Consciousness: awake and patient cooperative  Airway & Oxygen Therapy: Patient Spontanous Breathing and Patient connected to face mask  Post-op Assessment: Report given to RN and Post -op Vital signs reviewed and stable  Post vital signs: stable  Last Vitals:  Vitals Value Taken Time  BP 122/83 04/14/2018  4:55 PM  Temp 36.3 C 04/14/2018  4:55 PM  Pulse 73 04/14/2018  5:04 PM  Resp 15 04/14/2018  5:04 PM  SpO2 100 % 04/14/2018  5:04 PM  Vitals shown include unvalidated device data.  Last Pain:  Vitals:   04/14/18 1655  TempSrc:   PainSc: 0-No pain         Complications: No apparent anesthesia complications

## 2018-04-14 NOTE — Anesthesia Postprocedure Evaluation (Signed)
Anesthesia Post Note  Patient: Brooke Juarez  Procedure(s) Performed: HERNIA REPAIR INGUINAL (Right Abdomen) APPENDECTOMY (Left Abdomen)  Patient location during evaluation: PACU Anesthesia Type: General Level of consciousness: awake and alert Pain management: pain level controlled Vital Signs Assessment: post-procedure vital signs reviewed and stable Respiratory status: spontaneous breathing, nonlabored ventilation, respiratory function stable and patient connected to nasal cannula oxygen Cardiovascular status: blood pressure returned to baseline and stable Postop Assessment: no apparent nausea or vomiting Anesthetic complications: no     Last Vitals:  Vitals:   04/14/18 1740 04/14/18 1754  BP: 111/72 138/60  Pulse: 68 72  Resp: 18 18  Temp:    SpO2: 99% 97%    Last Pain:  Vitals:   04/14/18 1754  TempSrc:   PainSc: 6                  Precious Haws Amaryllis Malmquist

## 2018-04-14 NOTE — Anesthesia Preprocedure Evaluation (Addendum)
Anesthesia Evaluation  Patient identified by MRN, date of birth, ID band Patient awake    Reviewed: Allergy & Precautions, NPO status , Patient's Chart, lab work & pertinent test results, reviewed documented beta blocker date and time   Airway Mallampati: II  TM Distance: >3 FB     Dental  (+) Chipped   Pulmonary Current Smoker,           Cardiovascular + CAD       Neuro/Psych Seizures -,  PSYCHIATRIC DISORDERS Depression    GI/Hepatic GERD  Controlled,  Endo/Other  Hypothyroidism   Renal/GU      Musculoskeletal   Abdominal   Peds  Hematology   Anesthesia Other Findings   Reproductive/Obstetrics                             Anesthesia Physical Anesthesia Plan  ASA: III  Anesthesia Plan: General ETT   Post-op Pain Management:    Induction: Intravenous  PONV Risk Score and Plan: Ondansetron and Dexamethasone  Airway Management Planned: Oral ETT  Additional Equipment:   Intra-op Plan:   Post-operative Plan:   Informed Consent: I have reviewed the patients History and Physical, chart, labs and discussed the procedure including the risks, benefits and alternatives for the proposed anesthesia with the patient or authorized representative who has indicated his/her understanding and acceptance.     Plan Discussed with: CRNA  Anesthesia Plan Comments:        Anesthesia Quick Evaluation

## 2018-04-14 NOTE — Brief Op Note (Signed)
04/14/2018  4:54 PM  PATIENT:  Brooke Juarez  67 y.o. female  PRE-OPERATIVE DIAGNOSIS:  INCARCERATED RIGHT INGUINAL HERNIA  POST-OPERATIVE DIAGNOSIS:  INCARCERATED RIGHT FEMORAL HERNIA CONTAINING APPENDIX  PROCEDURE:  Procedure(s): HERNIA REPAIR INGUINAL (Right) APPENDECTOMY (Left)  SURGEON:  Surgeon(s) and Role:    * Zorana Brockwell, MD - Primary    * Pabon, Sutton, MD - Assisting  ANESTHESIA:   general  EBL:  25 mL   BLOOD ADMINISTERED:none  DRAINS: none   LOCAL MEDICATIONS USED:  BUPIVICAINE   SPECIMEN:  Source of Specimen:  right hernia sac, right inguinal lymph node, appendix  DISPOSITION OF SPECIMEN:  PATHOLOGY  COUNTS:  YES  TOURNIQUET:  * No tourniquets in log *  DICTATION: .Dragon Dictation  PLAN OF CARE: Discharge to home after PACU  PATIENT DISPOSITION:  PACU - hemodynamically stable.   Delay start of Pharmacological VTE agent (>24hrs) due to surgical blood loss or risk of bleeding: no

## 2018-04-14 NOTE — Op Note (Addendum)
  Procedure Date:  04/14/2018  Pre-operative Diagnosis:  Incarcerated right inguinal hernia  Post-operative Diagnosis:  Incarcerated right femoral hernia  Procedure:  Right Femoral Hernia Repair, Appendectomoy  Surgeon:  Melvyn Neth, MD  Assistant:  Caroleen Hamman, MD.  His assistance was of critical importance as the patient had a complicated hernia containing appendix, which was discovered intraoperatively.  He assisted in appendectomy and in femoral hernia repair.  Anesthesia:  General endotracheal  Estimated Blood Loss:  20 ml  Specimens:  Appendix, right hernia sac, right inguinal lymph node  Complications:  None  Indications for Procedure:  This is a 67 y.o. female who presents with an incarcerated right inguinal hernia on CT scan.  The options of surgery versus observation were reviewed with the patient and/or family. The risks of bleeding, abscess or infection, recurrence of symptoms, potential for an open procedure, injury to surrounding structures, and chronic pain were all discussed with the patient and was willing to proceed.  Description of Procedure: The patient was correctly identified in the preoperative area and brought into the operating room.  The patient was placed supine with VTE prophylaxis in place.  Appropriate time-outs were performed.  Anesthesia was induced and the patient was intubated.  Appropriate antibiotics were infused.  The right groin and lower abdomen were prepped and draped in a sterile fashion. An oblique incision was made between the pubic symphysis extending laterally toward the ASIS. Using electrocautery, the subcutaneous tissues were dissected, assuring adequate hemostasis.  The tissue planes were very distorted and the hard mass that was herniating was palpable and encountered. The mass was entered and looked like incarcerated omentum.  This was dissected and part of hernia sac was removed.  Upon exploring this mass, it was discovered that the  patient's appendix was herniating through, and it was incarcerated and inflamed.  At this point, I called Dr. Dahlia Byes to come into the OR to assess the situation to confirm that indeed it was appendix herniating.  This was confirmed.  After further exploration, it was noted the patient had a femoral hernia and not an inguinal hernia.  The inguinal ligament was located superior to the appendix and where it was herniating through.  The appendix was dissected and the mesoappendix was ligated with 2-0 Silk ties.  The appendix was stapled off at its base using 55 mm blue load GIA stapler.  The cecum was pushed back into the abdominal cavity.  The hernia sac was sutured using 2-0 Prolene purse string and excess was amputated and sent to pathology.  The stump was pushed back into the abdominal cavity.  The hernia defect was approximated using 2-0 Prolene suture.  The femoral area was then covered using tissue approximation bringing the external oblique aponeurosis and shelving edge inferiorly.  An inguinal lymph node was noted at the inferior portion of the incision and was excised and sent to pathology.  Then the wound was thoroughly irrigated and 40 ml of Exparel solution was infiltrated.  The wound was then closed in multiple layers using 0 Vicryl, 3-0 Vicryl, and 4-0 Monocryl.  The wound was cleaned and sealed with DermaBond.  The patient was emerged from anesthesia and extubated and brought to the recovery room for further management.  The patient tolerated the procedure well and all counts were correct at the end of the case.   Melvyn Neth, MD

## 2018-04-14 NOTE — Interval H&P Note (Signed)
History and Physical Interval Note:  04/14/2018 2:17 PM  Brooke Juarez  has presented today for surgery, with the diagnosis of INGUINAL HERNIA  The various methods of treatment have been discussed with the patient and family. After consideration of risks, benefits and other options for treatment, the patient has consented to  Procedure(s): HERNIA REPAIR INGUINAL ADULT (Right) as a surgical intervention .  The patient's history has been reviewed, patient examined, no change in status, stable for surgery.  I have reviewed the patient's chart and labs.  Questions were answered to the patient's satisfaction.     Venda Dice

## 2018-04-14 NOTE — Anesthesia Procedure Notes (Signed)
Procedure Name: Intubation Date/Time: 04/14/2018 2:42 PM Performed by: Lavone Orn, CRNA Pre-anesthesia Checklist: Patient identified, Emergency Drugs available, Suction available, Patient being monitored and Timeout performed Patient Re-evaluated:Patient Re-evaluated prior to induction Oxygen Delivery Method: Circle system utilized Preoxygenation: Pre-oxygenation with 100% oxygen Induction Type: IV induction Ventilation: Mask ventilation without difficulty Laryngoscope Size: Mac and 3 Grade View: Grade I Tube type: Oral Tube size: 7.0 mm Number of attempts: 1 Airway Equipment and Method: Stylet (cricoid pressure) Placement Confirmation: ETT inserted through vocal cords under direct vision,  positive ETCO2 and breath sounds checked- equal and bilateral Secured at: 21 cm Tube secured with: Tape Dental Injury: Teeth and Oropharynx as per pre-operative assessment

## 2018-04-14 NOTE — Anesthesia Post-op Follow-up Note (Signed)
Anesthesia QCDR form completed.        

## 2018-04-14 NOTE — OR Nursing (Signed)
Anesthesia reviewed EKG

## 2018-04-14 NOTE — Discharge Instructions (Signed)

## 2018-04-15 ENCOUNTER — Encounter: Payer: Self-pay | Admitting: Surgery

## 2018-04-19 LAB — SURGICAL PATHOLOGY

## 2018-04-28 ENCOUNTER — Ambulatory Visit: Admit: 2018-04-28 | Payer: Medicare Other | Admitting: Surgery

## 2018-04-28 ENCOUNTER — Ambulatory Visit (INDEPENDENT_AMBULATORY_CARE_PROVIDER_SITE_OTHER): Payer: Medicare Other | Admitting: Surgery

## 2018-04-28 ENCOUNTER — Encounter: Payer: Self-pay | Admitting: Surgery

## 2018-04-28 ENCOUNTER — Other Ambulatory Visit: Payer: Self-pay

## 2018-04-28 VITALS — BP 122/64 | HR 84 | Temp 97.7°F | Ht 62.5 in | Wt 146.4 lb

## 2018-04-28 DIAGNOSIS — K4130 Unilateral femoral hernia, with obstruction, without gangrene, not specified as recurrent: Secondary | ICD-10-CM | POA: Insufficient documentation

## 2018-04-28 DIAGNOSIS — Z09 Encounter for follow-up examination after completed treatment for conditions other than malignant neoplasm: Secondary | ICD-10-CM

## 2018-04-28 SURGERY — REPAIR, HERNIA, INGUINAL, LAPAROSCOPIC
Anesthesia: General | Laterality: Right

## 2018-04-28 NOTE — Patient Instructions (Signed)
Patient is to return to the to the office as needed.   Call the office with any questions or concerns.

## 2018-04-28 NOTE — Progress Notes (Signed)
04/28/2018  HPI: Brooke Juarez is a 67 y.o. female s/p incarcerated right femoral hernia repair on 11/27.  She had presented with what appeared to be an incarcerated right inguinal hernia but discovered to be a femoral hernia containing appendix in the OR.  She presents today for follow up.  Denies any significant pain.  Denies any bulging or recurrence.  Vital signs: BP 122/64   Pulse 84   Temp 97.7 F (36.5 C) (Temporal)   Ht 5' 2.5" (1.588 m)   Wt 146 lb 6.4 oz (66.4 kg)   LMP  (LMP Unknown) Comment: had hysterectomy  BMI 26.35 kg/m    Physical Exam: Constitutional: No acute distress Abdomen:  Soft, nondistended, nontender to palpation.  Right groin with incision clean, dry, intact and healing well.  There is firmness under the scar consistent with healing tissue.  No evidence of recurrence.  Assessment/Plan: This is a 67 y.o. female s/p incarcerated right femoral hernia repair and appendectomy.  Discussed pathology results with the patient.  Hernia sac, appendix, and two lymph nodes negative for malignancy.  Reminded patient that she still has two more weeks of no heavy lifting or pushing of no more than 10-15 lbs.  She may return to normal activities afterwards.  Discussed with the patient that since no mesh was used, there is a higher chance of recurrence, and discussed return precautions with her.  May follow up PRN.   Melvyn Neth, [facility]

## 2018-06-02 ENCOUNTER — Ambulatory Visit (INDEPENDENT_AMBULATORY_CARE_PROVIDER_SITE_OTHER): Payer: Medicare Other | Admitting: Family Medicine

## 2018-06-02 ENCOUNTER — Encounter: Payer: Self-pay | Admitting: Family Medicine

## 2018-06-02 DIAGNOSIS — E039 Hypothyroidism, unspecified: Secondary | ICD-10-CM | POA: Diagnosis not present

## 2018-06-02 DIAGNOSIS — F330 Major depressive disorder, recurrent, mild: Secondary | ICD-10-CM

## 2018-06-02 DIAGNOSIS — G2581 Restless legs syndrome: Secondary | ICD-10-CM | POA: Diagnosis not present

## 2018-06-02 DIAGNOSIS — K219 Gastro-esophageal reflux disease without esophagitis: Secondary | ICD-10-CM | POA: Diagnosis not present

## 2018-06-02 DIAGNOSIS — E876 Hypokalemia: Secondary | ICD-10-CM

## 2018-06-02 MED ORDER — POTASSIUM CHLORIDE ER 10 MEQ PO TBCR: 10.0000 meq | EXTENDED_RELEASE_TABLET | Freq: Every day | ORAL | 1 refills | Status: DC

## 2018-06-02 MED ORDER — PANTOPRAZOLE SODIUM 40 MG PO TBEC: 40.0000 mg | DELAYED_RELEASE_TABLET | Freq: Every day | ORAL | 1 refills | Status: DC

## 2018-06-02 MED ORDER — TRAZODONE HCL 100 MG PO TABS: 100.0000 mg | ORAL_TABLET | Freq: Every day | ORAL | 1 refills | Status: DC

## 2018-06-02 MED ORDER — ROPINIROLE HCL 1 MG PO TABS: 1.0000 mg | ORAL_TABLET | Freq: Every day | ORAL | 1 refills | Status: DC

## 2018-06-02 NOTE — Progress Notes (Signed)
Date:  06/02/2018   Name:  Brooke Juarez   DOB:  1950-05-22   MRN:  [id number]   Chief Complaint: Hypothyroidism; Gastroesophageal Reflux; hypokalemia; and Insomnia  Gastroesophageal Reflux  She complains of heartburn and tooth decay. She reports no abdominal pain, no belching, no chest pain, no choking, no coughing, no dysphagia, no early satiety, no globus sensation, no hoarse voice, no nausea, no sore throat, no stridor, no water brash or no wheezing. This is a chronic problem. The current episode started more than 1 year ago. The problem occurs frequently. The problem has been unchanged. The heartburn is of mild intensity. Associated symptoms include fatigue. Pertinent negatives include no weight loss. There are no known risk factors. She has tried a PPI for the symptoms. The treatment provided moderate relief. Past procedures do not include an abdominal ultrasound, an EGD, esophageal manometry, esophageal pH monitoring, H. pylori antibody titer or a UGI.  Insomnia  Primary symptoms: fragmented sleep, no sleep disturbance, difficulty falling asleep, no somnolence, no frequent awakening, no premature morning awakening, no malaise/fatigue, no napping.   The current episode started more than one year. The problem occurs nightly. The problem has been rapidly improving since onset. The symptoms are aggravated by anxiety (restless leg). The symptoms are relieved by medication (trazadone). The treatment provided moderate relief. PMH includes: restless leg syndrome.  Thyroid Problem  Presents for follow-up visit. Symptoms include anxiety, dry skin, fatigue, hair loss and nail problem. Patient reports no cold intolerance, constipation, depressed mood, diaphoresis, diarrhea, heat intolerance, hoarse voice, leg swelling, menstrual problem, palpitations, tremors, visual change, weight gain or weight loss. The symptoms have been stable.    Review of Systems  Constitutional: Positive for fatigue. Negative  for chills, diaphoresis, fever, malaise/fatigue, unexpected weight change, weight gain and weight loss.  HENT: Negative for congestion, ear discharge, ear pain, hoarse voice, rhinorrhea, sinus pressure, sneezing and sore throat.   Eyes: Negative for photophobia, pain, discharge, redness and itching.  Respiratory: Negative for cough, choking, shortness of breath, wheezing and stridor.   Cardiovascular: Negative for chest pain and palpitations.  Gastrointestinal: Positive for heartburn. Negative for abdominal pain, blood in stool, constipation, diarrhea, dysphagia, nausea and vomiting.  Endocrine: Negative for cold intolerance, heat intolerance, polydipsia, polyphagia and polyuria.  Genitourinary: Negative for dysuria, flank pain, frequency, hematuria, menstrual problem, pelvic pain, urgency, vaginal bleeding and vaginal discharge.  Musculoskeletal: Negative for arthralgias, back pain and myalgias.  Skin: Negative for rash.  Allergic/Immunologic: Negative for environmental allergies and food allergies.  Neurological: Negative for dizziness, tremors, weakness, light-headedness, numbness and headaches.  Hematological: Negative for adenopathy. Does not bruise/bleed easily.  Psychiatric/Behavioral: Negative for dysphoric mood and sleep disturbance. The patient is nervous/anxious and has insomnia.     Patient Active Problem List   Diagnosis Date Noted  . Irreducible right femoral hernia 04/28/2018  . Limited mobility with chronic pain & MS 04/05/2018  . Tobacco abuse 04/05/2018  . MS (multiple sclerosis) ([place])   . Hypokalemia 11/25/2017  . Seasonal allergic rhinitis due to pollen 12/08/2016  . CAD (coronary artery disease) 11/27/2014  . Medicare annual wellness visit, subsequent 11/27/2014  . Vitamin D deficiency 11/22/2014  . Familial multiple lipoprotein-type hyperlipidemia 09/29/2014  . Clinical depression 09/29/2014  . Acid reflux 09/29/2014  . Routine general medical examination at a  health care facility 09/29/2014  . Restless leg 09/29/2014  . DS (disseminated sclerosis) ([place]) 09/29/2014  . Nicotine addiction 09/29/2014  . OP (osteoporosis) 09/29/2014  . Adult  hypothyroidism 10/05/2013  . Collagenous colitis 10/05/2013  . Hypothyroid 10/05/2013    Allergies  Allergen Reactions  . Codeine Nausea And Vomiting    Past Surgical History:  Procedure Laterality Date  . APPENDECTOMY Left 04/14/2018   Procedure: APPENDECTOMY;  Surgeon: Olean Ree, MD;  Location: [facility] ORS;  Service: General;  Laterality: Left;  . CATARACT EXTRACTION W/PHACO Right 10/30/2014   Procedure: CATARACT EXTRACTION PHACO AND INTRAOCULAR LENS PLACEMENT ([place]);  Surgeon: Estill Cotta, MD;  Location: [facility] ORS;  Service: Ophthalmology;  Laterality: Right;  [place]  1:55.7   . CATARACT EXTRACTION W/PHACO Left 06/18/2015   Procedure: CATARACT EXTRACTION PHACO AND INTRAOCULAR LENS PLACEMENT ([facility]);  Surgeon: Estill Cotta, MD;  Location: [facility] ORS;  Service: Ophthalmology;  Laterality: Left;  [place] 01:36 AP% 26.1 CDE 42.19 fluid pack lo0t # [id number]  . COLONOSCOPY  2014   normal- Rein- repeat in 5 years  . EYE SURGERY    . FOOT SURGERY    . INGUINAL HERNIA REPAIR Right 04/14/2018   Procedure: HERNIA REPAIR INGUINAL;  Surgeon: Olean Ree, MD;  Location: [facility] ORS;  Service: General;  Laterality: Right;  . NASAL SINUS SURGERY    . TONSILLECTOMY    . TUBAL LIGATION    . VAGINAL HYSTERECTOMY      Social History   Tobacco Use  . Smoking status: Current Every Day Smoker    Packs/day: 0.50    Years: 55.00    Pack years: 27.50    Types: Cigarettes  . Smokeless tobacco: Never Used  . Tobacco comment: takes Wellbutrin; continuing to reduce # of cigarettes smoked; Referred to Burgess Estelle, RN  Substance Use Topics  . Alcohol use: No    Alcohol/week: 0.0 standard drinks  . Drug use: No     Medication list has been reviewed and updated.  Current Meds  Medication Sig  . acetaminophen  (TYLENOL) 500 MG tablet Take 1,000 mg by mouth 2 (two) times daily as needed for moderate pain or headache.  . Cholecalciferol (VITAMIN D) 2000 UNITS CAPS Take 1 capsule (2,000 Units total) by mouth daily. (Patient taking differently: Take 2,000 Units by mouth every 14 (fourteen) days. )  . gabapentin (NEURONTIN) 600 MG tablet Take 1,200 mg by mouth 2 (two) times daily.   [name] ibuprofen (ADVIL,MOTRIN) 600 MG tablet Take 1 tablet (600 mg total) by mouth every 8 (eight) hours as needed for fever, mild pain or moderate pain.  [name] levothyroxine (SYNTHROID, LEVOTHROID) 100 MCG tablet TAKE ONE TABLET BY MOUTH ONCE DAILY. NEEDS APPOINTMENT  . montelukast (SINGULAIR) 10 MG tablet Take 1 tablet (10 mg total) by mouth at bedtime.  . Omega-3 1000 MG CAPS Take 1,000 mg by mouth daily.  . pantoprazole (PROTONIX) 40 MG tablet Take 1 tablet (40 mg total) by mouth daily.  . potassium chloride (K-DUR) 10 MEQ tablet Take 1 tablet (10 mEq total) by mouth daily.  [name] rOPINIRole (REQUIP) 1 MG tablet Take 1 tablet (1 mg total) by mouth daily. (Patient taking differently: Take 1 mg by mouth at bedtime. )  . sulfaSALAzine (AZULFIDINE) 500 MG EC tablet Take 1,500 mg by mouth daily. 2 in the am and 1 in the pm  . traZODone (DESYREL) 100 MG tablet Take 1 tablet (100 mg total) by mouth daily.  . vitamin B-12 (CYANOCOBALAMIN) 500 MCG tablet Take 500 mcg by mouth every 14 (fourteen) days.     PHQ 2/9 Scores 11/25/2017 07/16/2017 12/08/2016 12/03/2015  PHQ - 2 Score 0 2 0 0  PHQ- 9 Score 0 8 - -    Physical Exam  BP 120/76   Pulse 72   Ht 5' 2.5" (1.588 m)   Wt 146 lb (66.2 kg)   LMP  (LMP Unknown) Comment: had hysterectomy  BMI 26.28 kg/m   Assessment and Plan: 1. Adult hypothyroidism Chronic.  Controlled.  Will check TSH and renal function panel and pending TSH level will adjust levothyroxine accordingly - TSH - Renal Function Panel  2. Hypokalemia Chronic.  Stable.  Due to diuretic.  Will check renal panel to assess  potassium level in the meantime we will continue K-Dur 10 mEq once a day. - Renal Function Panel - potassium chloride (K-DUR) 10 MEQ tablet; Take 1 tablet (10 mEq total) by mouth daily.  Dispense: 90 tablet; Refill: 1  3. Gastroesophageal reflux disease, esophagitis presence not specified Chronic.  Stable.  Continue pantoprazole 40 mg once a day - pantoprazole (PROTONIX) 40 MG tablet; Take 1 tablet (40 mg total) by mouth daily.  Dispense: 90 tablet; Refill: 1  4. Restless leg Chronic.  Continue Requip 1 mg 1 nightly. - rOPINIRole (REQUIP) 1 MG tablet; Take 1 tablet (1 mg total) by mouth at bedtime.  Dispense: 90 tablet; Refill: 1  5. Mild episode of recurrent major depressive disorder ([place]) Has a history of depression which is controlled on trazodone 1 daily 100 mg tablet. - traZODone (DESYREL) 100 MG tablet; Take 1 tablet (100 mg total) by mouth daily.  Dispense: 90 tablet; Refill: 1

## 2018-06-03 LAB — RENAL FUNCTION PANEL
Albumin: 4.2 g/dL (ref 3.5–4.8)
BUN/Creatinine Ratio: 9 — ABNORMAL LOW (ref 12–28)
BUN: 7 mg/dL — ABNORMAL LOW (ref 8–27)
CO2: 25 mmol/L (ref 20–29)
Calcium: 9.2 mg/dL (ref 8.7–10.3)
Chloride: 106 mmol/L (ref 96–106)
Creatinine, Ser: 0.8 mg/dL (ref 0.57–1.00)
GFR calc Af Amer: 86 mL/min/{1.73_m2} (ref >59)
GFR, EST NON AFRICAN AMERICAN: 75 mL/min/{1.73_m2} (ref >59)
Glucose: 95 mg/dL (ref 65–99)
PHOSPHORUS: 3.7 mg/dL (ref 3.0–4.3)
Potassium: 4.7 mmol/L (ref 3.5–5.2)
SODIUM: 146 mmol/L — AB (ref 134–144)

## 2018-06-03 LAB — TSH: TSH: 0.542 u[IU]/mL (ref 0.450–4.500)

## 2018-06-14 ENCOUNTER — Other Ambulatory Visit: Payer: Self-pay | Admitting: Family Medicine

## 2018-06-14 DIAGNOSIS — E039 Hypothyroidism, unspecified: Secondary | ICD-10-CM

## 2018-06-21 DIAGNOSIS — F5105 Insomnia due to other mental disorder: Secondary | ICD-10-CM | POA: Diagnosis not present

## 2018-06-21 DIAGNOSIS — F330 Major depressive disorder, recurrent, mild: Secondary | ICD-10-CM | POA: Diagnosis not present

## 2018-07-19 ENCOUNTER — Ambulatory Visit: Payer: Self-pay

## 2018-07-28 DIAGNOSIS — R569 Unspecified convulsions: Secondary | ICD-10-CM | POA: Diagnosis not present

## 2018-07-28 DIAGNOSIS — G2581 Restless legs syndrome: Secondary | ICD-10-CM | POA: Diagnosis not present

## 2018-07-28 DIAGNOSIS — R4189 Other symptoms and signs involving cognitive functions and awareness: Secondary | ICD-10-CM | POA: Diagnosis not present

## 2018-07-28 DIAGNOSIS — R2689 Other abnormalities of gait and mobility: Secondary | ICD-10-CM | POA: Diagnosis not present

## 2018-07-28 DIAGNOSIS — F5101 Primary insomnia: Secondary | ICD-10-CM | POA: Diagnosis not present

## 2018-08-26 ENCOUNTER — Encounter: Payer: Self-pay | Admitting: Family Medicine

## 2018-08-26 ENCOUNTER — Ambulatory Visit (INDEPENDENT_AMBULATORY_CARE_PROVIDER_SITE_OTHER): Payer: Medicare Other | Admitting: Family Medicine

## 2018-08-26 VITALS — BP 110/68 | HR 64 | Ht 62.5 in | Wt 147.0 lb

## 2018-08-26 DIAGNOSIS — J301 Allergic rhinitis due to pollen: Secondary | ICD-10-CM

## 2018-08-26 DIAGNOSIS — J0140 Acute pansinusitis, unspecified: Secondary | ICD-10-CM

## 2018-08-26 MED ORDER — AMOXICILLIN 500 MG PO CAPS: 500.0000 mg | ORAL_CAPSULE | Freq: Three times a day (TID) | ORAL | 0 refills | Status: DC

## 2018-08-26 MED ORDER — MONTELUKAST SODIUM 10 MG PO TABS: 10.0000 mg | ORAL_TABLET | Freq: Every day | ORAL | 3 refills | Status: DC

## 2018-08-26 NOTE — Progress Notes (Signed)
Date:  08/26/2018   Name:  Brooke Juarez   DOB:  [birth year]   MRN:  [id number]   Chief Complaint: Sinusitis (no fever/ no travel/ no exposure- sinus drainage with yellow production, hurting around eyes, no cheek pain, no cough)  I connected withthis patient, Brooke Juarez, by telephoneat the patient's home.  I verified that I am speaking with the correct person using two identifiers. This visit was conducted via telephone due to the Covid-19 outbreak from my office at [facility] in [place], [place]. I discussed the limitations, risks, security and privacy concerns of performing an evaluation and management service by telephone. I also discussed with the patient that there may be a patient responsible charge related to this service. The patient expressed understanding and agreed to proceed.  Sinusitis  This is a new problem. The current episode started 1 to 4 weeks ago (7-10). The problem has been waxing and waning since onset. There has been no fever. Associated symptoms include congestion, sinus pressure and sneezing. Pertinent negatives include no chills, coughing, diaphoresis, ear pain, headaches, hoarse voice, neck pain, shortness of breath, sore throat or swollen glands. (Nasal discharge) Past treatments include nothing.    Review of Systems  Constitutional: Negative.  Negative for chills, diaphoresis, fatigue, fever and unexpected weight change.  HENT: Positive for congestion, sinus pressure and sneezing. Negative for ear discharge, ear pain, hoarse voice, rhinorrhea and sore throat.   Eyes: Positive for itching. Negative for photophobia, pain, discharge and redness.  Respiratory: Negative for cough, chest tightness, shortness of breath, wheezing and stridor.   Gastrointestinal: Negative for abdominal pain, blood in stool, constipation, diarrhea, nausea and vomiting.  Endocrine: Negative for cold intolerance, heat intolerance, polydipsia, polyphagia and polyuria.  Genitourinary:  Negative for dysuria, flank pain, frequency, hematuria, menstrual problem, pelvic pain, urgency, vaginal bleeding and vaginal discharge.  Musculoskeletal: Negative for arthralgias, back pain, myalgias and neck pain.  Skin: Negative for rash.  Allergic/Immunologic: Negative for environmental allergies and food allergies.  Neurological: Negative for dizziness, weakness, light-headedness, numbness and headaches.  Hematological: Negative for adenopathy. Does not bruise/bleed easily.  Psychiatric/Behavioral: Negative for dysphoric mood. The patient is not nervous/anxious.     Patient Active Problem List   Diagnosis Date Noted  . Irreducible right femoral hernia 04/28/2018  . Limited mobility with chronic pain & MS 04/05/2018  . Tobacco abuse 04/05/2018  . MS (multiple sclerosis) ([place])   . Hypokalemia 11/25/2017  . Seasonal allergic rhinitis due to pollen 12/08/2016  . CAD (coronary artery disease) 11/27/2014  . Medicare annual wellness visit, subsequent 11/27/2014  . Vitamin D deficiency 11/22/2014  . Familial multiple lipoprotein-type hyperlipidemia 09/29/2014  . Clinical depression 09/29/2014  . Acid reflux 09/29/2014  . Routine general medical examination at a health care facility 09/29/2014  . Restless leg 09/29/2014  . DS (disseminated sclerosis) ([place]) 09/29/2014  . Nicotine addiction 09/29/2014  . OP (osteoporosis) 09/29/2014  . Adult hypothyroidism 10/05/2013  . Collagenous colitis 10/05/2013  . Hypothyroid 10/05/2013    Allergies  Allergen Reactions  . Codeine Nausea And Vomiting    Past Surgical History:  Procedure Laterality Date  . APPENDECTOMY Left 04/14/2018   Procedure: APPENDECTOMY;  Surgeon: Olean Ree, MD;  Location: [facility] ORS;  Service: General;  Laterality: Left;  . CATARACT EXTRACTION W/PHACO Right 10/30/2014   Procedure: CATARACT EXTRACTION PHACO AND INTRAOCULAR LENS PLACEMENT ([place]);  Surgeon: Estill Cotta, MD;  Location: [facility] ORS;  Service:  Ophthalmology;  Laterality: Right;  [place]  1:55.7   .  CATARACT EXTRACTION W/PHACO Left 06/18/2015   Procedure: CATARACT EXTRACTION PHACO AND INTRAOCULAR LENS PLACEMENT ([facility]);  Surgeon: Estill Cotta, MD;  Location: [facility] ORS;  Service: Ophthalmology;  Laterality: Left;  [place] 01:36 AP% 26.1 CDE 42.19 fluid pack lo0t # [id number]  . COLONOSCOPY  2014   normal- Rein- repeat in 5 years  . EYE SURGERY    . FOOT SURGERY    . INGUINAL HERNIA REPAIR Right 04/14/2018   Procedure: HERNIA REPAIR INGUINAL;  Surgeon: Olean Ree, MD;  Location: [facility] ORS;  Service: General;  Laterality: Right;  . NASAL SINUS SURGERY    . TONSILLECTOMY    . TUBAL LIGATION    . VAGINAL HYSTERECTOMY      Social History   Tobacco Use  . Smoking status: Current Every Day Smoker    Packs/day: 0.50    Years: 55.00    Pack years: 27.50    Types: Cigarettes  . Smokeless tobacco: Never Used  . Tobacco comment: takes Wellbutrin; continuing to reduce # of cigarettes smoked; Referred to Burgess Estelle, RN  Substance Use Topics  . Alcohol use: No    Alcohol/week: 0.0 standard drinks  . Drug use: No     Medication list has been reviewed and updated.  Current Meds  Medication Sig  . acetaminophen (TYLENOL) 500 MG tablet Take 1,000 mg by mouth 2 (two) times daily as needed for moderate pain or headache.  . alendronate (FOSAMAX) 70 MG tablet Take 1 tablet (70 mg total) by mouth once a week. Take with a full glass of water on an empty stomach.  . Cholecalciferol (VITAMIN D) 2000 UNITS CAPS Take 1 capsule (2,000 Units total) by mouth daily. (Patient taking differently: Take 2,000 Units by mouth every 14 (fourteen) days. )  . gabapentin (NEURONTIN) 600 MG tablet Take 1,200 mg by mouth 2 (two) times daily.   [name] ibuprofen (ADVIL,MOTRIN) 600 MG tablet Take 1 tablet (600 mg total) by mouth every 8 (eight) hours as needed for fever, mild pain or moderate pain.  [name] levothyroxine (SYNTHROID, LEVOTHROID) 100 MCG tablet TAKE 1 TABLET BY  MOUTH ONCE DAILY.  [name] Omega-3 1000 MG CAPS Take 1,000 mg by mouth daily.  . pantoprazole (PROTONIX) 40 MG tablet Take 1 tablet (40 mg total) by mouth daily.  . potassium chloride (K-DUR) 10 MEQ tablet Take 1 tablet (10 mEq total) by mouth daily.  [name] rOPINIRole (REQUIP) 1 MG tablet Take 1 tablet (1 mg total) by mouth at bedtime.  . sulfaSALAzine (AZULFIDINE) 500 MG EC tablet Take 1,500 mg by mouth daily. 2 in the am and 1 in the pm  . traZODone (DESYREL) 100 MG tablet Take 1 tablet (100 mg total) by mouth daily.  . vitamin B-12 (CYANOCOBALAMIN) 500 MCG tablet Take 500 mcg by mouth every 14 (fourteen) days.     PHQ 2/9 Scores 11/25/2017 07/16/2017 12/08/2016 12/03/2015  PHQ - 2 Score 0 2 0 0  PHQ- 9 Score 0 8 - -    BP Readings from Last 3 Encounters:  08/26/18 110/68  06/02/18 120/76  04/28/18 122/64    Physical Exam Vitals signs and nursing note reviewed.  HENT:     Nose:     Right Sinus: Maxillary sinus tenderness and frontal sinus tenderness present.     Left Sinus: Frontal sinus tenderness present.     Wt Readings from Last 3 Encounters:  08/26/18 147 lb (66.7 kg)  06/02/18 146 lb (66.2 kg)  04/28/18 146 lb 6.4 oz (66.4 kg)  BP 110/68   Pulse 64   Ht 5' 2.5" (1.588 m)   Wt 147 lb (66.7 kg)   LMP  (LMP Unknown) Comment: had hysterectomy  BMI 26.46 kg/m   Assessment and Plan: I spent 10 minutes with this patient, More than 50% of that time was spent in voice to voice education, counseling and care coordination. 1. Acute pansinusitis, recurrence not specified Acute acute systemic patient has had symptoms for several days now which is not relieved with over-the-counter medications.  Generally this has taken an antibiotic in the past.  Has been prescribed amoxicillin 500 mg 1 3 times a day.  Does not need to have anything for cough or congestion.  Will also refill her Singulair as well milligrams 1 a day - amoxicillin (AMOXIL) 500 MG capsule; Take 1 capsule (500 mg total) by  mouth 3 (three) times daily.  Dispense: 30 capsule; Refill: 0 I spent 10 minutes with this patient, More than 50% of that time was spent in voice to voice education, counseling and care coordination.   2. Seasonal allergic rhinitis due to pollen Prosodic depending on the seasonal allergy.  Patient has had a particularly difficult times and has not had her Singulair to take over the past 2 weeks will refill Singulair 10 mg once a day. - montelukast (SINGULAIR) 10 MG tablet; Take 1 tablet (10 mg total) by mouth at bedtime.  Dispense: 90 tablet; Refill: 3

## 2018-09-13 ENCOUNTER — Other Ambulatory Visit: Payer: Self-pay | Admitting: Family Medicine

## 2018-09-13 DIAGNOSIS — M8000XS Age-related osteoporosis with current pathological fracture, unspecified site, sequela: Secondary | ICD-10-CM

## 2018-09-27 DIAGNOSIS — R1312 Dysphagia, oropharyngeal phase: Secondary | ICD-10-CM | POA: Diagnosis not present

## 2018-09-27 DIAGNOSIS — K52831 Collagenous colitis: Secondary | ICD-10-CM | POA: Diagnosis not present

## 2018-09-27 DIAGNOSIS — K219 Gastro-esophageal reflux disease without esophagitis: Secondary | ICD-10-CM | POA: Diagnosis not present

## 2018-09-27 DIAGNOSIS — Z800 Family history of malignant neoplasm of digestive organs: Secondary | ICD-10-CM | POA: Diagnosis not present

## 2018-10-14 DIAGNOSIS — H353131 Nonexudative age-related macular degeneration, bilateral, early dry stage: Secondary | ICD-10-CM | POA: Diagnosis not present

## 2018-11-03 ENCOUNTER — Ambulatory Visit (INDEPENDENT_AMBULATORY_CARE_PROVIDER_SITE_OTHER): Payer: Medicare Other

## 2018-11-03 ENCOUNTER — Other Ambulatory Visit: Payer: Self-pay

## 2018-11-03 VITALS — BP 112/72 | HR 75 | Temp 98.2°F | Resp 16 | Ht 63.0 in | Wt 151.0 lb

## 2018-11-03 DIAGNOSIS — Z0000 Encounter for general adult medical examination without abnormal findings: Secondary | ICD-10-CM | POA: Diagnosis not present

## 2018-11-03 DIAGNOSIS — Z1231 Encounter for screening mammogram for malignant neoplasm of breast: Secondary | ICD-10-CM

## 2018-11-03 NOTE — Progress Notes (Signed)
Subjective:   Brooke Juarez is a 68 y.o. female who presents for Medicare Annual (Subsequent) preventive examination.  Review of Systems:   Cardiac Risk Factors include: advanced age (>89mn, >>63women);dyslipidemia     Objective:     Vitals: BP 112/72 (BP Location: Left Arm, Patient Position: Sitting, Cuff Size: Normal)   Pulse 75   Temp 98.2 F (36.8 C) (Oral)   Resp 16   Ht 5' 3"  (1.6 m)   Wt 151 lb (68.5 kg)   LMP  (LMP Unknown) Comment: had hysterectomy  SpO2 98%   BMI 26.75 kg/m   Body mass index is 26.75 kg/m.  Advanced Directives 11/03/2018 04/14/2018 04/04/2018 07/16/2017 11/27/2014  Does Patient Have a Medical Advance Directive? No No No No No  Would patient like information on creating a medical advance directive? Yes (MAU/Ambulatory/Procedural Areas - Information given) No - Patient declined - Yes (MAU/Ambulatory/Procedural Areas - Information given) No - patient declined information    Tobacco Social History   Tobacco Use  Smoking Status Current Every Day Smoker  . Packs/day: 0.75  . Years: 55.00  . Pack years: 41.25  . Types: Cigarettes  Smokeless Tobacco Never Used     Ready to quit: Not Answered Counseling given: Not Answered   Clinical Intake:  Pre-visit preparation completed: Yes  Pain : 0-10 Pain Score: 5  Pain Type: Chronic pain Pain Location: (all over pain, legs, upper back) Pain Descriptors / Indicators: Aching, Discomfort Pain Onset: More than a month ago Pain Frequency: Constant     BMI - recorded: 26.75 Nutritional Status: BMI 25 -29 Overweight Nutritional Risks: None Diabetes: No  How often do you need to have someone help you when you read instructions, pamphlets, or other written materials from your doctor or pharmacy?: 1 - Never  Interpreter Needed?: No  Information entered by :: KClemetine MarkerLPN  Past Medical History:  Diagnosis Date  . [place] disease [place]   . Depression   . GERD (gastroesophageal reflux disease)    . Heart murmur   . Hyperlipidemia   . Hypothyroidism   . MS [place]multiple sclerosis) ([place]   . Restless leg   . Seizures ([place]   . Tremors of nervous system    Past Surgical History:  Procedure Laterality Date  . APPENDECTOMY Left 04/14/2018   Procedure: APPENDECTOMY;  Surgeon: POlean Ree MD;  Location: [facility]S;  Service: General;  Laterality: Left;  . CATARACT EXTRACTION W/PHACO Right 10/30/2014   Procedure: CATARACT EXTRACTION PHACO AND INTRAOCULAR LENS PLACEMENT [place];  Surgeon: SEstill Cotta MD;  Location: [facility]S;  Service: Ophthalmology;  Laterality: Right;  [place] 1:55.7   . CATARACT EXTRACTION W/PHACO Left 06/18/2015   Procedure: CATARACT EXTRACTION PHACO AND INTRAOCULAR LENS PLACEMENT ([facility]);  Surgeon: SEstill Cotta MD;  Location: [facility] ORS;  Service: Ophthalmology;  Laterality: Left;  [place]:36 AP% 26.1 CDE 42.19 fluid pack lo0t # [id number]  . COLONOSCOPY  2014   normal- Rein- repeat in 5 years  . EYE SURGERY    . FOOT SURGERY    . INGUINAL HERNIA REPAIR Right 04/14/2018   Procedure: HERNIA REPAIR INGUINAL;  Surgeon: POlean Ree MD;  Location: [facility] ORS;  Service: General;  Laterality: Right;  . NASAL SINUS SURGERY    . TONSILLECTOMY    . TUBAL LIGATION    . VAGINAL HYSTERECTOMY     Family History  Problem Relation Age of Onset  . Cancer Mother   . Heart disease Mother   .  Stroke Mother   . Stroke Father   . Breast cancer Neg Hx    Social History   Socioeconomic History  . Marital status: Married    Spouse name: Not on file  . Number of children: 3  . Years of education: some college  . Highest education level: 12th grade  Occupational History  . Occupation: Retired  Scientific laboratory technician  . Financial resource strain: Not hard at all  . Food insecurity    Worry: Never true    Inability: Never true  . Transportation needs    Medical: No    Non-medical: No  Tobacco Use  . Smoking status: Current Every Day Smoker    Packs/day: 0.75    Years: 55.00     Pack years: 41.25    Types: Cigarettes  . Smokeless tobacco: Never Used  Substance and Sexual Activity  . Alcohol use: No    Alcohol/week: 0.0 standard drinks  . Drug use: No  . Sexual activity: Not on file  Lifestyle  . Physical activity    Days per week: 0 days    Minutes per session: 0 min  . Stress: Only a little  Relationships  . Social connections    Talks on phone: More than three times a week    Gets together: Three times a week    Attends religious service: More than 4 times per year    Active member of club or organization: No    Attends meetings of clubs or organizations: Never    Relationship status: Married  Other Topics Concern  . Not on file  Social History Narrative  . Not on file    Outpatient Encounter Medications as of 11/03/2018  Medication Sig  . acetaminophen (TYLENOL) 500 MG tablet Take 1,000 mg by mouth 2 (two) times daily as needed for moderate pain or headache.  . alendronate (FOSAMAX) 70 MG tablet TAKE 1 TABLET BY MOUTH ONCE A WEEK ON AN EMPTY STOMACH WITH  A  FULL  GLASS  OF  WATER  . cholecalciferol (VITAMIN D-400) 10 MCG (400 UNIT) TABS tablet Take 400 Units by mouth.  . gabapentin (NEURONTIN) 300 MG capsule Take 1 capsule by mouth 4 (four) times daily.  [name] ibuprofen (ADVIL,MOTRIN) 600 MG tablet Take 1 tablet (600 mg total) by mouth every 8 (eight) hours as needed for fever, mild pain or moderate pain.  [name] levothyroxine (SYNTHROID, LEVOTHROID) 100 MCG tablet TAKE 1 TABLET BY MOUTH ONCE DAILY.  . montelukast (SINGULAIR) 10 MG tablet Take 1 tablet (10 mg total) by mouth at bedtime.  . Multiple Vitamins-Minerals (PRESERVISION AREDS 2 PO) Take 2 capsules by mouth daily.  . Omega-3 1000 MG CAPS Take 1,000 mg by mouth daily.  . pantoprazole (PROTONIX) 40 MG tablet Take 1 tablet (40 mg total) by mouth daily.  . potassium chloride (K-DUR) 10 MEQ tablet Take 1 tablet (10 mEq total) by mouth daily. (Patient taking differently: Take 10 mEq by mouth daily. Pt  taking 1 tab 3 days per week only)  . rOPINIRole (REQUIP) 1 MG tablet Take 1 tablet (1 mg total) by mouth at bedtime.  . sulfaSALAzine (AZULFIDINE) 500 MG EC tablet Take 1 tablet by mouth 2 (two) times a day.  . traZODone (DESYREL) 100 MG tablet Take 1 tablet (100 mg total) by mouth daily.  . vitamin B-12 (CYANOCOBALAMIN) 500 MCG tablet Take 500 mcg by mouth every 14 (fourteen) days.   . [DISCONTINUED] amoxicillin (AMOXIL) 500 MG capsule Take 1 capsule (500  mg total) by mouth 3 (three) times daily.  . [DISCONTINUED] Cholecalciferol (VITAMIN D) 2000 UNITS CAPS Take 1 capsule (2,000 Units total) by mouth daily. (Patient taking differently: Take 2,000 Units by mouth every 14 (fourteen) days. )  . [DISCONTINUED] gabapentin (NEURONTIN) 600 MG tablet Take 1,200 mg by mouth 2 (two) times daily.   . [DISCONTINUED] sulfaSALAzine (AZULFIDINE) 500 MG EC tablet Take 1,500 mg by mouth daily. 2 in the am and 1 in the pm   No facility-administered encounter medications on file as of 11/03/2018.     Activities of Daily Living In your present state of health, do you have any difficulty performing the following activities: 11/03/2018  Hearing? N  Comment declines hearing aids  Vision? N  Difficulty concentrating or making decisions? N  Walking or climbing stairs? N  Dressing or bathing? N  Doing errands, shopping? N  Preparing Food and eating ? N  Using the Toilet? N  In the past six months, have you accidently leaked urine? N  Do you have problems with loss of bowel control? N  Managing your Medications? N  Managing your Finances? N  Housekeeping or managing your Housekeeping? N  Some recent data might be hidden    Patient Care Team: Juline Patch, MD as PCP - General (Family Medicine) Vladimir Crofts, MD as Consulting Physician (Neurology) Chauncey Mann, MD as Consulting Physician (Psychiatry) Michael Boston, MD as Consulting Physician (General Surgery) Manya Silvas, MD as Consulting  Physician (Gastroenterology)    Assessment:   This is a routine wellness examination for [place].  Exercise Activities and Dietary recommendations Current Exercise Habits: The patient does not participate in regular exercise at present, Exercise limited by: neurologic condition(s);orthopedic condition(s)  Goals    . DIET - INCREASE WATER INTAKE     Recommend to drink at least 6-8 8oz glasses of water per day.       Fall Risk Fall Risk  11/03/2018 04/28/2018 04/13/2018 07/16/2017 12/08/2016  Falls in the past year? 0 0 0 No No  Number falls in past yr: 0 0 - - -  Injury with Fall? 0 0 - - -  Risk for fall due to : - - - History of fall(s);Impaired balance/gait;Impaired mobility;Impaired vision -  Risk for fall due to: Comment - - - MS, wears eyeglasses -  Follow up Falls prevention discussed - - - -   FALL RISK PREVENTION PERTAINING TO THE HOME:  Any stairs in or around the home? Yes  If so, do they handrails? Yes   Home free of loose throw rugs in walkways, pet beds, electrical cords, etc? Yes  Adequate lighting in your home to reduce risk of falls? Yes   ASSISTIVE DEVICES UTILIZED TO PREVENT FALLS:  Life alert? No  Use of a cane, walker or w/c? Yes  Grab bars in the bathroom? Yes  Shower chair or bench in shower? Yes  Elevated toilet seat or a handicapped toilet? Yes   DME ORDERS:  DME order needed?  No   TIMED UP AND GO:  Was the test performed? Yes .  Length of time to ambulate 10 feet: 8 sec.   GAIT:  Appearance of gait: G Gait slow, steady and with the use of an assistive device.   Education: Fall risk prevention has been discussed.  Intervention(s) required? No   Depression Screen PHQ 2/9 Scores 11/03/2018 11/25/2017 07/16/2017 12/08/2016  PHQ - 2 Score 0 0 2 0  PHQ- 9 Score -  0 8 -     Cognitive Function     6CIT Screen 11/03/2018 07/16/2017  What Year? 0 points 0 points  What month? 0 points 0 points  What time? 0 points 0 points  Count back from 20 0  points 0 points  Months in reverse 0 points 0 points  Repeat phrase 0 points 0 points  Total Score 0 0    Immunization History  Administered Date(s) Administered  . Influenza, High Dose Seasonal PF 04/09/2018  . Influenza,inj,Quad PF,6+ Mos 06/16/2017  . Pneumococcal Conjugate-13 05/25/2014  . Pneumococcal Polysaccharide-23 05/20/2007, 05/25/2014, 12/08/2016  . Tdap 05/20/2007    Qualifies for Shingles Vaccine? Yes . Due for Shingrix. Education has been provided regarding the importance of this vaccine. Pt has been advised to call insurance company to determine out of pocket expense. Advised may also receive vaccine at local pharmacy or Health Dept. Verbalized acceptance and understanding.  Tdap: Although this vaccine is not a covered service during a Wellness Exam, does the patient still wish to receive this vaccine today?  No .  Education has been provided regarding the importance of this vaccine. Advised may receive this vaccine at local pharmacy or Health Dept. Aware to provide a copy of the vaccination record if obtained from local pharmacy or Health Dept. Verbalized acceptance and understanding.  Flu Vaccine: Up to date  Pneumococcal Vaccine: Up to date    Screening Tests Health Maintenance  Topic Date Due  . TETANUS/TDAP  05/19/2017  . MAMMOGRAM  07/31/2018  . INFLUENZA VACCINE  12/18/2018  . COLONOSCOPY  01/18/2023  . DEXA SCAN  Completed  . Hepatitis C Screening  Completed  . PNA vac Low Risk Adult  Completed    Cancer Screenings:  Colorectal Screening: Completed 01/17/13. Repeat every 10 years.  Mammogram: Completed 07/30/17. Repeat every year. Ordered today. Pt provided with contact information and advised to call to schedule appt.   Bone Density: Completed 06/27/14. Results reflect NORMAL Repeat every 2 years. Pt declines osteoporosis.   Lung Cancer Screening: (Low Dose CT Chest recommended if Age 83-80 years, 30 pack-year currently smoking OR have quit w/in  15years.) does qualify. Pt declines.   Additional Screening:  Hepatitis C Screening: does qualify; Completed 12/08/16  Vision Screening: Recommended annual ophthalmology exams for early detection of glaucoma and other disorders of the eye. Is the patient up to date with their annual eye exam?  Yes  Who is the provider or what is the name of the office in which the pt attends annual eye exams? [place] Screening: Recommended annual dental exams for proper oral hygiene  Community Resource Referral:  CRR required this visit?  No      Plan:     I have personally reviewed and addressed the Medicare Annual Wellness questionnaire and have noted the following in the patient's chart:  A. Medical and social history B. Use of alcohol, tobacco or illicit drugs  C. Current medications and supplements D. Functional ability and status E.  Nutritional status F.  Physical activity G. Advance directives H. List of other physicians I.  Hospitalizations, surgeries, and ER visits in previous 12 months J.  [place] such as hearing and vision if needed, cognitive and depression L. Referrals and appointments   In addition, I have reviewed and discussed with patient certain preventive protocols, quality metrics, and best practice recommendations. A written personalized care plan for preventive services as well as general preventive health recommendations were  provided to patient.   Signed,  Clemetine Marker, LPN Nurse Health Advisor   Nurse Notes:

## 2018-11-03 NOTE — Patient Instructions (Signed)
Brooke Juarez , Thank you for taking time to come for your Medicare Wellness Visit. I appreciate your ongoing commitment to your health goals. Please review the following plan we discussed and let me know if I can assist you in the future.   Screening recommendations/referrals: Colonoscopy: done 01/28/13. Repeat in 2024. Mammogram: done 08/09/17. Please call [phone] to schedule your mammogram.  Bone Density: done 06/27/14 Recommended yearly ophthalmology/optometry visit for glaucoma screening and checkup Recommended yearly dental visit for hygiene and checkup  Vaccinations: Influenza vaccine: done 04/09/18 Pneumococcal vaccine: done 12/08/16 Tdap vaccine: done 2009 Shingles vaccine: Shingrix discussed. Please contact your pharmacy for coverage information.   Advanced directives: Advance directive discussed with you today. I have provided a copy for you to complete at home and have notarized. Once this is complete please bring a copy in to our office so we can scan it into your chart.  Conditions/risks identified: recommend increasing physical activity  Next appointment: Please follow up in one year for your Medicare Annual Wellness visit.     Preventive Care 50 Years and Older, Female Preventive care refers to lifestyle choices and visits with your health care provider that can promote health and wellness. What does preventive care include?  A yearly physical exam. This is also called an annual well check.  Dental exams once or twice a year.  Routine eye exams. Ask your health care provider how often you should have your eyes checked.  Personal lifestyle choices, including:  Daily care of your teeth and gums.  Regular physical activity.  Eating a healthy diet.  Avoiding tobacco and drug use.  Limiting alcohol use.  Practicing safe sex.  Taking low-dose aspirin every day.  Taking vitamin and mineral supplements as recommended by your health care provider. What happens  during an annual well check? The services and screenings done by your health care provider during your annual well check will depend on your age, overall health, lifestyle risk factors, and family history of disease. Counseling  Your health care provider may ask you questions about your:  Alcohol use.  Tobacco use.  Drug use.  Emotional well-being.  Home and relationship well-being.  Sexual activity.  Eating habits.  History of falls.  Memory and ability to understand (cognition).  Work and work Statistician.  Reproductive health. Screening  You may have the following tests or measurements:  Height, weight, and BMI.  Blood pressure.  Lipid and cholesterol levels. These may be checked every 5 years, or more frequently if you are over 50 years old.  Skin check.  Lung cancer screening. You may have this screening every year starting at age 13 if you have a 30-pack-year history of smoking and currently smoke or have quit within the past 15 years.  Fecal occult blood test (FOBT) of the stool. You may have this test every year starting at age 84.  Flexible sigmoidoscopy or colonoscopy. You may have a sigmoidoscopy every 5 years or a colonoscopy every 10 years starting at age 60.  Hepatitis C blood test.  Hepatitis B blood test.  Sexually transmitted disease (STD) testing.  Diabetes screening. This is done by checking your blood sugar (glucose) after you have not eaten for a while (fasting). You may have this done every 1-3 years.  Bone density scan. This is done to screen for osteoporosis. You may have this done starting at age 32.  Mammogram. This may be done every 1-2 years. Talk to your health care provider about how often you  should have regular mammograms. Talk with your health care provider about your test results, treatment options, and if necessary, the need for more tests. Vaccines  Your health care provider may recommend certain vaccines, such as:   Influenza vaccine. This is recommended every year.  Tetanus, diphtheria, and acellular pertussis (Tdap, Td) vaccine. You may need a Td booster every 10 years.  Zoster vaccine. You may need this after age 22.  Pneumococcal 13-valent conjugate (PCV13) vaccine. One dose is recommended after age 37.  Pneumococcal polysaccharide (PPSV23) vaccine. One dose is recommended after age 34. Talk to your health care provider about which screenings and vaccines you need and how often you need them. This information is not intended to replace advice given to you by your health care provider. Make sure you discuss any questions you have with your health care provider. Document Released: 06/01/2015 Document Revised: 01/23/2016 Document Reviewed: 03/06/2015 Elsevier Interactive Patient Education  2017 [place] Prevention in the Home Falls can cause injuries. They can happen to people of all ages. There are many things you can do to make your home safe and to help prevent falls. What can I do on the outside of my home?  Regularly fix the edges of walkways and driveways and fix any cracks.  Remove anything that might make you trip as you walk through a door, such as a raised step or threshold.  Trim any bushes or trees on the path to your home.  Use bright outdoor lighting.  Clear any walking paths of anything that might make someone trip, such as rocks or tools.  Regularly check to see if handrails are loose or broken. Make sure that both sides of any steps have handrails.  Any raised decks and porches should have guardrails on the edges.  Have any leaves, snow, or ice cleared regularly.  Use sand or salt on walking paths during winter.  Clean up any spills in your garage right away. This includes oil or grease spills. What can I do in the bathroom?  Use night lights.  Install grab bars by the toilet and in the tub and shower. Do not use towel bars as grab bars.  Use non-skid mats  or decals in the tub or shower.  If you need to sit down in the shower, use a plastic, non-slip stool.  Keep the floor dry. Clean up any water that spills on the floor as soon as it happens.  Remove soap buildup in the tub or shower regularly.  Attach bath mats securely with double-sided non-slip rug tape.  Do not have throw rugs and other things on the floor that can make you trip. What can I do in the bedroom?  Use night lights.  Make sure that you have a light by your bed that is easy to reach.  Do not use any sheets or blankets that are too big for your bed. They should not hang down onto the floor.  Have a firm chair that has side arms. You can use this for support while you get dressed.  Do not have throw rugs and other things on the floor that can make you trip. What can I do in the kitchen?  Clean up any spills right away.  Avoid walking on wet floors.  Keep items that you use a lot in easy-to-reach places.  If you need to reach something above you, use a strong step stool that has a grab bar.  Keep electrical cords out  of the way.  Do not use floor polish or wax that makes floors slippery. If you must use wax, use non-skid floor wax.  Do not have throw rugs and other things on the floor that can make you trip. What can I do with my stairs?  Do not leave any items on the stairs.  Make sure that there are handrails on both sides of the stairs and use them. Fix handrails that are broken or loose. Make sure that handrails are as long as the stairways.  Check any carpeting to make sure that it is firmly attached to the stairs. Fix any carpet that is loose or worn.  Avoid having throw rugs at the top or bottom of the stairs. If you do have throw rugs, attach them to the floor with carpet tape.  Make sure that you have a light switch at the top of the stairs and the bottom of the stairs. If you do not have them, ask someone to add them for you. What else can I do to  help prevent falls?  Wear shoes that:  Do not have high heels.  Have rubber bottoms.  Are comfortable and fit you well.  Are closed at the toe. Do not wear sandals.  If you use a stepladder:  Make sure that it is fully opened. Do not climb a closed stepladder.  Make sure that both sides of the stepladder are locked into place.  Ask someone to hold it for you, if possible.  Clearly mark and make sure that you can see:  Any grab bars or handrails.  First and last steps.  Where the edge of each step is.  Use tools that help you move around (mobility aids) if they are needed. These include:  Canes.  Walkers.  Scooters.  Crutches.  Turn on the lights when you go into a dark area. Replace any light bulbs as soon as they burn out.  Set up your furniture so you have a clear path. Avoid moving your furniture around.  If any of your floors are uneven, fix them.  If there are any pets around you, be aware of where they are.  Review your medicines with your doctor. Some medicines can make you feel dizzy. This can increase your chance of falling. Ask your doctor what other things that you can do to help prevent falls. This information is not intended to replace advice given to you by your health care provider. Make sure you discuss any questions you have with your health care provider. Document Released: 03/01/2009 Document Revised: 10/11/2015 Document Reviewed: 06/09/2014 Elsevier Interactive Patient Education  2017 [employer].

## 2018-11-10 ENCOUNTER — Other Ambulatory Visit: Payer: Self-pay | Admitting: Family Medicine

## 2018-11-10 DIAGNOSIS — G2581 Restless legs syndrome: Secondary | ICD-10-CM

## 2018-11-10 DIAGNOSIS — E039 Hypothyroidism, unspecified: Secondary | ICD-10-CM

## 2018-12-01 ENCOUNTER — Ambulatory Visit (INDEPENDENT_AMBULATORY_CARE_PROVIDER_SITE_OTHER): Payer: Medicare Other | Admitting: Family Medicine

## 2018-12-01 ENCOUNTER — Other Ambulatory Visit: Payer: Self-pay

## 2018-12-01 ENCOUNTER — Encounter: Payer: Self-pay | Admitting: Family Medicine

## 2018-12-01 VITALS — BP 110/80 | HR 72 | Ht 63.0 in | Wt 149.0 lb

## 2018-12-01 DIAGNOSIS — J301 Allergic rhinitis due to pollen: Secondary | ICD-10-CM | POA: Diagnosis not present

## 2018-12-01 DIAGNOSIS — E039 Hypothyroidism, unspecified: Secondary | ICD-10-CM | POA: Diagnosis not present

## 2018-12-01 DIAGNOSIS — R69 Illness, unspecified: Secondary | ICD-10-CM

## 2018-12-01 DIAGNOSIS — F330 Major depressive disorder, recurrent, mild: Secondary | ICD-10-CM | POA: Diagnosis not present

## 2018-12-01 DIAGNOSIS — K219 Gastro-esophageal reflux disease without esophagitis: Secondary | ICD-10-CM | POA: Diagnosis not present

## 2018-12-01 DIAGNOSIS — G2581 Restless legs syndrome: Secondary | ICD-10-CM

## 2018-12-01 DIAGNOSIS — M8000XS Age-related osteoporosis with current pathological fracture, unspecified site, sequela: Secondary | ICD-10-CM

## 2018-12-01 MED ORDER — LEVOTHYROXINE SODIUM 100 MCG PO TABS: 100.0000 ug | ORAL_TABLET | Freq: Every day | ORAL | 1 refills | Status: DC

## 2018-12-01 MED ORDER — ROPINIROLE HCL 1 MG PO TABS: 1.0000 mg | ORAL_TABLET | Freq: Every day | ORAL | 1 refills | Status: DC

## 2018-12-01 MED ORDER — PANTOPRAZOLE SODIUM 40 MG PO TBEC: 40.0000 mg | DELAYED_RELEASE_TABLET | Freq: Every day | ORAL | 1 refills | Status: DC

## 2018-12-01 MED ORDER — ALENDRONATE SODIUM 70 MG PO TABS: 70.0000 mg | ORAL_TABLET | ORAL | 1 refills | Status: DC

## 2018-12-01 MED ORDER — MONTELUKAST SODIUM 10 MG PO TABS: 10.0000 mg | ORAL_TABLET | Freq: Every day | ORAL | 1 refills | Status: DC

## 2018-12-01 MED ORDER — TRAZODONE HCL 100 MG PO TABS: 100.0000 mg | ORAL_TABLET | Freq: Every day | ORAL | 1 refills | Status: DC

## 2018-12-01 NOTE — Progress Notes (Signed)
Date:  12/01/2018   Name:  Brooke Juarez   DOB:  Oct 04, 1950   MRN:  [id number]   Chief Complaint: Allergic Rhinitis , Osteoporosis, Gastroesophageal Reflux, Hypothyroidism, Insomnia, and restless leg  Gastroesophageal Reflux She reports no abdominal pain, no belching, no chest pain, no choking, no coughing, no dysphagia, no early satiety, no globus sensation, no heartburn, no hoarse voice, no nausea, no sore throat, no stridor, no tooth decay, no water brash or no wheezing. This is a chronic problem. The current episode started more than 1 year ago. The problem occurs rarely. The problem has been waxing and waning. The symptoms are aggravated by certain foods. Pertinent negatives include no anemia, fatigue, melena, muscle weakness, orthopnea or weight loss. There are no known risk factors. She has tried a PPI for the symptoms. The treatment provided moderate relief.  Insomnia Primary symptoms: no fragmented sleep, no sleep disturbance, no difficulty falling asleep, no somnolence, no frequent awakening, no premature morning awakening, no malaise/fatigue, no napping.   The current episode started more than one year. The onset quality is gradual. The problem has been waxing and waning since onset. Relieved by: trazadone. The treatment provided moderate relief. PMH includes: associated symptoms present, no hypertension, no depression, no family stress or anxiety, no restless leg syndrome, no work related stressors, no chronic pain, no apnea.   Thyroid Problem Presents for follow-up visit. Patient reports no anxiety, cold intolerance, constipation, depressed mood, diaphoresis, diarrhea, dry skin, fatigue, hair loss, heat intolerance, hoarse voice, leg swelling, menstrual problem, nail problem, palpitations, tremors, visual change, weight gain or weight loss. The symptoms have been stable.  URI  This is a chronic problem. The current episode started more than 1 year ago. The problem has been waxing and  waning. There has been no fever. Pertinent negatives include no abdominal pain, chest pain, congestion, coughing, diarrhea, dysuria, ear pain, headaches, joint pain, joint swelling, nausea, neck pain, plugged ear sensation, rash, rhinorrhea, sinus pain, sneezing, sore throat, swollen glands, vomiting or wheezing.    Review of Systems  Constitutional: Negative.  Negative for chills, diaphoresis, fatigue, fever, malaise/fatigue, unexpected weight change, weight gain and weight loss.  HENT: Negative for congestion, ear discharge, ear pain, hoarse voice, mouth sores, nosebleeds, rhinorrhea, sinus pressure, sinus pain, sneezing and sore throat.   Eyes: Negative for photophobia, pain, discharge, redness and itching.  Respiratory: Negative for apnea, cough, choking, shortness of breath, wheezing and stridor.   Cardiovascular: Negative for chest pain and palpitations.  Gastrointestinal: Negative for abdominal pain, blood in stool, constipation, diarrhea, dysphagia, heartburn, melena, nausea and vomiting.  Endocrine: Negative for cold intolerance, heat intolerance, polydipsia, polyphagia and polyuria.  Genitourinary: Negative for dysuria, flank pain, frequency, hematuria, menstrual problem, pelvic pain, urgency, vaginal bleeding and vaginal discharge.  Musculoskeletal: Negative for arthralgias, back pain, joint pain, myalgias, muscle weakness and neck pain.  Skin: Negative for rash.  Allergic/Immunologic: Negative for environmental allergies and food allergies.  Neurological: Negative for dizziness, tremors, weakness, light-headedness, numbness and headaches.  Hematological: Negative for adenopathy. Does not bruise/bleed easily.  Psychiatric/Behavioral: Negative for depression, dysphoric mood and sleep disturbance. The patient has insomnia. The patient is not nervous/anxious.     Patient Active Problem List   Diagnosis Date Noted  . Irreducible right femoral hernia 04/28/2018  . Limited mobility with  chronic pain & MS 04/05/2018  . Tobacco abuse 04/05/2018  . MS (multiple sclerosis) ([place])   . Hypokalemia 11/25/2017  . Seasonal allergic rhinitis due to pollen  12/08/2016  . CAD (coronary artery disease) 11/27/2014  . Medicare annual wellness visit, subsequent 11/27/2014  . Vitamin D deficiency 11/22/2014  . Familial multiple lipoprotein-type hyperlipidemia 09/29/2014  . Clinical depression 09/29/2014  . Acid reflux 09/29/2014  . Routine general medical examination at a health care facility 09/29/2014  . Restless leg 09/29/2014  . DS (disseminated sclerosis) ([place]) 09/29/2014  . Nicotine addiction 09/29/2014  . OP (osteoporosis) 09/29/2014  . Adult hypothyroidism 10/05/2013  . Collagenous colitis 10/05/2013  . Hypothyroid 10/05/2013    Allergies  Allergen Reactions  . Codeine Nausea And Vomiting    Past Surgical History:  Procedure Laterality Date  . APPENDECTOMY Left 04/14/2018   Procedure: APPENDECTOMY;  Surgeon: Olean Ree, MD;  Location: [facility] OR[facility];  Service: General;  Laterality: Left;  . CATARACT EXTRACTION W/PHACO Right 10/30/2014   Procedure: CATARACT EXTRACTION PHACO AND INTRAOCULAR LENS PLACEMENT ([place]);  Surgeon: Estill Cotta, MD;  Location: [facility];  Service: Ophthalmology;  Laterality: Right;  [place]  1:55.7   . CATARACT EXTRACTION W/PHACO Left 06/18/2015   Procedure: CATARACT EXTRACTION PHACO AND INTRAOCULAR LENS PLACEMENT ([facility]);  Surgeon: Estill Cotta, MD;  Location: [facility];  Service: Ophthalmology;  Laterality: Left;  [place] 01:36 AP% 26.1 CDE 42.19 fluid pack lo0t # [id number]  . COLONOSCOPY  2014   normal- Rein- repeat in 5 years  . EYE SURGERY    . FOOT SURGERY    . INGUINAL HERNIA REPAIR Right 04/14/2018   Procedure: HERNIA REPAIR INGUINAL;  Surgeon: Olean Ree, MD;  Location: [facility];  Service: General;  Laterality: Right;  . NASAL SINUS SURGERY    . TONSILLECTOMY    . TUBAL LIGATION    . VAGINAL HYSTERECTOMY      Social History   Tobacco  Use  . Smoking status: Current Every Day Smoker    Packs/day: 0.75    Years: 55.00    Pack years: 41.25    Types: Cigarettes  . Smokeless tobacco: Never Used  Substance Use Topics  . Alcohol use: No    Alcohol/week: 0.0 standard drinks  . Drug use: No     Medication list has been reviewed and updated.  Current Meds  Medication Sig  . acetaminophen (TYLENOL) 500 MG tablet Take 1,000 mg by mouth 2 (two) times daily as needed for moderate pain or headache.  . alendronate (FOSAMAX) 70 MG tablet TAKE 1 TABLET BY MOUTH ONCE A WEEK ON AN EMPTY STOMACH WITH  A  FULL  GLASS  OF  WATER  . cholecalciferol (VITAMIN D-400) 10 MCG (400 UNIT) TABS tablet Take 400 Units by mouth.  . gabapentin (NEURONTIN) 300 MG capsule Take 1 capsule by mouth 4 (four) times daily. [name]  . ibuprofen (ADVIL,MOTRIN) 600 MG tablet Take 1 tablet (600 mg total) by mouth every 8 (eight) hours as needed for fever, mild pain or moderate pain.  [name] levothyroxine (SYNTHROID) 100 MCG tablet Take 1 tablet by mouth once daily  . montelukast (SINGULAIR) 10 MG tablet Take 1 tablet (10 mg total) by mouth at bedtime.  . Multiple Vitamins-Minerals (PRESERVISION AREDS 2 PO) Take 2 capsules by mouth daily.  . Omega-3 1000 MG CAPS Take 1,000 mg by mouth daily.  . pantoprazole (PROTONIX) 40 MG tablet Take 1 tablet (40 mg total) by mouth daily.  . potassium chloride (K-DUR) 10 MEQ tablet Take 1 tablet (10 mEq total) by mouth daily. (Patient taking differently: Take 10 mEq by mouth daily. Pt taking 1 tab 3 days per week only)  .  rOPINIRole (REQUIP) 1 MG tablet TAKE 1 TABLET BY MOUTH AT BEDTIME  . sulfaSALAzine (AZULFIDINE) 500 MG EC tablet Take 1 tablet by mouth 2 (two) times a day.  . traZODone (DESYREL) 100 MG tablet Take 1 tablet (100 mg total) by mouth daily.  . vitamin B-12 (CYANOCOBALAMIN) 500 MCG tablet Take 500 mcg by mouth every 14 (fourteen) days.     PHQ 2/9 Scores 12/01/2018 11/03/2018 11/25/2017 07/16/2017  PHQ - 2 Score 0 0 0 2   PHQ- 9 Score 4 - 0 8    BP Readings from Last 3 Encounters:  12/01/18 110/80  11/03/18 112/72  08/26/18 110/68    Physical Exam Vitals signs and nursing note reviewed.  Constitutional:      Appearance: She is well-developed.  HENT:     Head: Normocephalic.     Right Ear: Tympanic membrane, ear canal and external ear normal.     Left Ear: Tympanic membrane, ear canal and external ear normal.     Nose: Nose normal. No congestion or rhinorrhea.     Mouth/Throat:     Mouth: Mucous membranes are dry.     Pharynx: Oropharynx is clear. No oropharyngeal exudate or posterior oropharyngeal erythema.  Eyes:     General: Lids are everted, no foreign bodies appreciated. No scleral icterus.       Left eye: No foreign body or hordeolum.     Extraocular Movements: Extraocular movements intact.     Conjunctiva/sclera: Conjunctivae normal.     Right eye: Right conjunctiva is not injected.     Left eye: Left conjunctiva is not injected.     Pupils: Pupils are equal, round, and reactive to light.  Neck:     Musculoskeletal: Normal range of motion and neck supple. No neck rigidity or muscular tenderness.     Thyroid: No thyromegaly.     Vascular: No carotid bruit or JVD.     Trachea: No tracheal deviation.  Cardiovascular:     Rate and Rhythm: Normal rate and regular rhythm.     Heart sounds: Normal heart sounds. No murmur. No friction rub. No gallop.   Pulmonary:     Effort: Pulmonary effort is normal. No respiratory distress.     Breath sounds: Normal breath sounds. No stridor. No wheezing, rhonchi or rales.  Chest:     Chest wall: No tenderness.  Abdominal:     General: Bowel sounds are normal. There is no distension.     Palpations: Abdomen is soft. There is no mass.     Tenderness: There is no abdominal tenderness. There is no right CVA tenderness, left CVA tenderness, guarding or rebound.     Hernia: No hernia is present.  Musculoskeletal: Normal range of motion.        General: No  swelling, tenderness, deformity or signs of injury.     Right lower leg: No edema.     Left lower leg: No edema.  Lymphadenopathy:     Cervical: No cervical adenopathy.  Skin:    General: Skin is warm.     Findings: No rash.  Neurological:     Mental Status: She is alert and oriented to person, place, and time.     Cranial Nerves: No cranial nerve deficit.     Sensory: No sensory deficit.     Coordination: Coordination normal.     Gait: Gait abnormal.     Deep Tendon Reflexes: Reflexes normal.  Psychiatric:        Mood and  Affect: Mood is not anxious or depressed.     Wt Readings from Last 3 Encounters:  12/01/18 149 lb (67.6 kg)  11/03/18 151 lb (68.5 kg)  08/26/18 147 lb (66.7 kg)    BP 110/80   Pulse 72   Ht 5' 3"  (1.6 m)   Wt 149 lb (67.6 kg)   LMP  (LMP Unknown) Comment: had hysterectomy  BMI 26.39 kg/m   Assessment and Plan:  1. Age-related osteoporosis with current pathological fracture, sequela Chronic.  Controlled.  Will continue Fosamax 70 mg once a week. - alendronate (FOSAMAX) 70 MG tablet; Take 1 tablet (70 mg total) by mouth once a week. Take with a full glass of water on an empty stomach.  Dispense: 12 tablet; Refill: 1  2. Adult hypothyroidism Chronic.  Controlled.  Review of TSH today and if within normal limits will continue 100 mcg of levothyroxine daily. - levothyroxine (SYNTHROID) 100 MCG tablet; Take 1 tablet (100 mcg total) by mouth daily.  Dispense: 90 tablet; Refill: 1 - TSH  3. Seasonal allergic rhinitis due to pollen Controlled.  Continue Singulair 10 mg once a day - montelukast (SINGULAIR) 10 MG tablet; Take 1 tablet (10 mg total) by mouth at bedtime.  Dispense: 90 tablet; Refill: 1  4. Gastroesophageal reflux disease, esophagitis presence not specified Controlled.  Chronic.  Continue pantoprazole 40 mg once a day. - pantoprazole (PROTONIX) 40 MG tablet; Take 1 tablet (40 mg total) by mouth daily.  Dispense: 90 tablet; Refill: 1  5.  Restless leg Restless leg is controlled on Requip 1 mg nightly - rOPINIRole (REQUIP) 1 MG tablet; Take 1 tablet (1 mg total) by mouth at bedtime.  Dispense: 90 tablet; Refill: 1  6. Mild episode of recurrent major depressive disorder (HCC) Depression PHQ is 0 today we will continue on trazodone 100 mg this also enables patient to sleep and covers her insomnia. - traZODone (DESYREL) 100 MG tablet; Take 1 tablet (100 mg total) by mouth daily.  Dispense: 90 tablet; Refill: 1  7. Taking medication for chronic disease Patient on statin for lipid control will check labwork for electrolyte disturbance. - Renal Function Panel - Lipid Panel With LDL/HDL Ratio

## 2018-12-02 LAB — RENAL FUNCTION PANEL
Albumin: 4.5 g/dL (ref 3.8–4.8)
BUN/Creatinine Ratio: 6 — ABNORMAL LOW (ref 12–28)
BUN: 5 mg/dL — ABNORMAL LOW (ref 8–27)
CO2: 22 mmol/L (ref 20–29)
Calcium: 9.6 mg/dL (ref 8.7–10.3)
Chloride: 103 mmol/L (ref 96–106)
Creatinine, Ser: 0.86 mg/dL (ref 0.57–1.00)
GFR calc Af Amer: 79 mL/min/{1.73_m2} (ref >59)
GFR calc non Af Amer: 69 mL/min/{1.73_m2} (ref >59)
Glucose: 88 mg/dL (ref 65–99)
Phosphorus: 3.5 mg/dL (ref 3.0–4.3)
Potassium: 4.3 mmol/L (ref 3.5–5.2)
Sodium: 144 mmol/L (ref 134–144)

## 2018-12-02 LAB — LIPID PANEL WITH LDL/HDL RATIO
Cholesterol, Total: 213 mg/dL — ABNORMAL HIGH (ref 100–199)
HDL: 44 mg/dL (ref >39)
LDL Calculated: 145 mg/dL — ABNORMAL HIGH (ref 0–99)
LDl/HDL Ratio: 3.3 ratio — ABNORMAL HIGH (ref 0.0–3.2)
Triglycerides: 121 mg/dL (ref 0–149)
VLDL Cholesterol Cal: 24 mg/dL (ref 5–40)

## 2018-12-02 LAB — TSH: TSH: 0.653 u[IU]/mL (ref 0.450–4.500)

## 2019-04-26 DIAGNOSIS — H353131 Nonexudative age-related macular degeneration, bilateral, early dry stage: Secondary | ICD-10-CM | POA: Diagnosis not present

## 2019-06-07 ENCOUNTER — Encounter: Payer: Self-pay | Admitting: Family Medicine

## 2019-06-07 ENCOUNTER — Ambulatory Visit (INDEPENDENT_AMBULATORY_CARE_PROVIDER_SITE_OTHER): Payer: Medicare Other | Admitting: Family Medicine

## 2019-06-07 ENCOUNTER — Other Ambulatory Visit: Payer: Self-pay

## 2019-06-07 VITALS — BP 130/70 | HR 64 | Ht 63.0 in | Wt 147.0 lb

## 2019-06-07 DIAGNOSIS — J301 Allergic rhinitis due to pollen: Secondary | ICD-10-CM | POA: Diagnosis not present

## 2019-06-07 DIAGNOSIS — E876 Hypokalemia: Secondary | ICD-10-CM | POA: Diagnosis not present

## 2019-06-07 DIAGNOSIS — F17210 Nicotine dependence, cigarettes, uncomplicated: Secondary | ICD-10-CM

## 2019-06-07 DIAGNOSIS — F330 Major depressive disorder, recurrent, mild: Secondary | ICD-10-CM

## 2019-06-07 DIAGNOSIS — E039 Hypothyroidism, unspecified: Secondary | ICD-10-CM | POA: Diagnosis not present

## 2019-06-07 DIAGNOSIS — M8000XS Age-related osteoporosis with current pathological fracture, unspecified site, sequela: Secondary | ICD-10-CM | POA: Diagnosis not present

## 2019-06-07 DIAGNOSIS — E782 Mixed hyperlipidemia: Secondary | ICD-10-CM

## 2019-06-07 DIAGNOSIS — K219 Gastro-esophageal reflux disease without esophagitis: Secondary | ICD-10-CM | POA: Diagnosis not present

## 2019-06-07 DIAGNOSIS — Z23 Encounter for immunization: Secondary | ICD-10-CM

## 2019-06-07 DIAGNOSIS — E785 Hyperlipidemia, unspecified: Secondary | ICD-10-CM | POA: Insufficient documentation

## 2019-06-07 DIAGNOSIS — G2581 Restless legs syndrome: Secondary | ICD-10-CM | POA: Diagnosis not present

## 2019-06-07 MED ORDER — TRAZODONE HCL 100 MG PO TABS: 100.0000 mg | ORAL_TABLET | Freq: Every day | ORAL | 1 refills | Status: DC

## 2019-06-07 MED ORDER — MONTELUKAST SODIUM 10 MG PO TABS: 10.0000 mg | ORAL_TABLET | Freq: Every day | ORAL | 1 refills | Status: DC

## 2019-06-07 MED ORDER — ALENDRONATE SODIUM 70 MG PO TABS: 70.0000 mg | ORAL_TABLET | ORAL | 1 refills | Status: DC

## 2019-06-07 MED ORDER — LEVOTHYROXINE SODIUM 100 MCG PO TABS: 100.0000 ug | ORAL_TABLET | Freq: Every day | ORAL | 1 refills | Status: DC

## 2019-06-07 MED ORDER — PANTOPRAZOLE SODIUM 40 MG PO TBEC: 40.0000 mg | DELAYED_RELEASE_TABLET | Freq: Every day | ORAL | 1 refills | Status: DC

## 2019-06-07 MED ORDER — POTASSIUM CHLORIDE ER 10 MEQ PO TBCR: 10.0000 meq | EXTENDED_RELEASE_TABLET | Freq: Every day | ORAL | 1 refills | Status: DC

## 2019-06-07 MED ORDER — ROPINIROLE HCL 1 MG PO TABS: 1.0000 mg | ORAL_TABLET | Freq: Every day | ORAL | 1 refills | Status: DC

## 2019-06-07 NOTE — Progress Notes (Addendum)
Date:  06/07/2019   Name:  Brooke Juarez   DOB:  [birth year]   MRN:  [id number]   Chief Complaint: hypokalemia, Insomnia, restless leg, Hypothyroidism, Osteoporosis, Allergic Rhinitis , and Gastroesophageal Reflux  Insomnia Primary symptoms: no fragmented sleep, no sleep disturbance, difficulty falling asleep, no somnolence, no frequent awakening, no premature morning awakening, no malaise/fatigue, no napping.   The current episode started more than one year. The problem has been waxing and waning since onset. The symptoms are aggravated by anxiety.  Gastroesophageal Reflux She complains of heartburn. She reports no abdominal pain, no belching, no chest pain, no choking, no coughing, no early satiety, no globus sensation, no hoarse voice, no nausea, no sore throat, no stridor, no tooth decay, no water brash or no wheezing. for restless leg/MS. The current episode started more than 1 year ago. The problem has been gradually improving. The symptoms are aggravated by certain foods. Pertinent negatives include no anemia, fatigue, melena or weight loss. She has tried a PPI for the symptoms.  Neurologic Problem The patient's primary symptoms include clumsiness. The patient's pertinent negatives include no altered mental status, focal sensory loss, focal weakness, loss of balance, memory loss, near-syncope, slurred speech, syncope, visual change or weakness. Primary symptoms comment: for restless leg/MS. The neurological problem developed gradually. The problem has been waxing and waning since onset. There was no focality noted. Pertinent negatives include no abdominal pain, back pain, bladder incontinence, bowel incontinence, chest pain, confusion, diaphoresis, dizziness, fatigue, fever, headaches, light-headedness, nausea, palpitations, shortness of breath or vomiting.  Thyroid Problem Presents for follow-up visit. Patient reports no anxiety, cold intolerance, constipation, depressed mood, diaphoresis,  diarrhea, dry skin, fatigue, hair loss, heat intolerance, hoarse voice, leg swelling, menstrual problem, nail problem, palpitations, tremors, visual change, weight gain or weight loss. The symptoms have been stable.    Lab Results  Component Value Date   CREATININE 0.86 12/01/2018   BUN 5 (L) 12/01/2018   NA 144 12/01/2018   K 4.3 12/01/2018   CL 103 12/01/2018   CO2 22 12/01/2018   Lab Results  Component Value Date   CHOL 213 (H) 12/01/2018   HDL 44 12/01/2018   LDLCALC 145 (H) 12/01/2018   TRIG 121 12/01/2018   CHOLHDL 3.6 06/16/2017   Lab Results  Component Value Date   TSH 0.653 12/01/2018   No results found for: HGBA1C   Review of Systems  Constitutional: Negative.  Negative for chills, diaphoresis, fatigue, fever, malaise/fatigue, unexpected weight change, weight gain and weight loss.  HENT: Negative for congestion, ear discharge, ear pain, hoarse voice, rhinorrhea, sinus pressure, sneezing and sore throat.   Eyes: Negative for photophobia, pain, discharge, redness and itching.  Respiratory: Negative for cough, choking, shortness of breath, wheezing and stridor.   Cardiovascular: Negative for chest pain, palpitations and near-syncope.  Gastrointestinal: Positive for heartburn. Negative for abdominal pain, blood in stool, bowel incontinence, constipation, diarrhea, melena, nausea and vomiting.  Endocrine: Negative for cold intolerance, heat intolerance, polydipsia, polyphagia and polyuria.  Genitourinary: Negative for bladder incontinence, dysuria, flank pain, frequency, hematuria, menstrual problem, pelvic pain, urgency, vaginal bleeding and vaginal discharge.  Musculoskeletal: Negative for arthralgias, back pain and myalgias.  Skin: Negative for rash.  Allergic/Immunologic: Negative for environmental allergies and food allergies.  Neurological: Negative for dizziness, tremors, focal weakness, syncope, weakness, light-headedness, numbness, headaches and loss of balance.    Hematological: Negative for adenopathy. Does not bruise/bleed easily.  Psychiatric/Behavioral: Negative for confusion, dysphoric mood, memory loss and sleep  disturbance. The patient has insomnia. The patient is not nervous/anxious.     Patient Active Problem List   Diagnosis Date Noted  . Irreducible right femoral hernia 04/28/2018  . Limited mobility with chronic pain & MS 04/05/2018  . Tobacco abuse 04/05/2018  . MS (multiple sclerosis) ([place])   . Hypokalemia 11/25/2017  . Seasonal allergic rhinitis due to pollen 12/08/2016  . CAD (coronary artery disease) 11/27/2014  . Medicare annual wellness visit, subsequent 11/27/2014  . Vitamin D deficiency 11/22/2014  . Familial multiple lipoprotein-type hyperlipidemia 09/29/2014  . Clinical depression 09/29/2014  . Acid reflux 09/29/2014  . Routine general medical examination at a health care facility 09/29/2014  . Restless leg 09/29/2014  . DS (disseminated sclerosis) ([place]) 09/29/2014  . Nicotine addiction 09/29/2014  . OP (osteoporosis) 09/29/2014  . Adult hypothyroidism 10/05/2013  . Collagenous colitis 10/05/2013  . Hypothyroid 10/05/2013    Allergies  Allergen Reactions  . Codeine Nausea And Vomiting    Past Surgical History:  Procedure Laterality Date  . APPENDECTOMY Left 04/14/2018   Procedure: APPENDECTOMY;  Surgeon: Olean Ree, MD;  Location: [facility] ORS;  Service: General;  Laterality: Left;  . CATARACT EXTRACTION W/PHACO Right 10/30/2014   Procedure: CATARACT EXTRACTION PHACO AND INTRAOCULAR LENS PLACEMENT ([place]);  Surgeon: Estill Cotta, MD;  Location: [facility];  Service: Ophthalmology;  Laterality: Right;  [place]  1:55.7   . CATARACT EXTRACTION W/PHACO Left 06/18/2015   Procedure: CATARACT EXTRACTION PHACO AND INTRAOCULAR LENS PLACEMENT ([facility]);  Surgeon: Estill Cotta, MD;  Location: [facility];  Service: Ophthalmology;  Laterality: Left;  [place] 01:36 AP% 26.1 CDE 42.19 fluid pack lo0t # [id number]  . COLONOSCOPY  2014    normal- Rein- repeat in 5 years  . EYE SURGERY    . FOOT SURGERY    . INGUINAL HERNIA REPAIR Right 04/14/2018   Procedure: HERNIA REPAIR INGUINAL;  Surgeon: Olean Ree, MD;  Location: [facility];  Service: General;  Laterality: Right;  . NASAL SINUS SURGERY    . TONSILLECTOMY    . TUBAL LIGATION    . VAGINAL HYSTERECTOMY      Social History   Tobacco Use  . Smoking status: Current Every Day Smoker    Packs/day: 0.75    Years: 55.00    Pack years: 41.25    Types: Cigarettes  . Smokeless tobacco: Never Used  Substance Use Topics  . Alcohol use: No    Alcohol/week: 0.0 standard drinks  . Drug use: No     Medication list has been reviewed and updated.  Current Meds  Medication Sig  . acetaminophen (TYLENOL) 500 MG tablet Take 1,000 mg by mouth 2 (two) times daily as needed for moderate pain or headache.  . alendronate (FOSAMAX) 70 MG tablet Take 1 tablet (70 mg total) by mouth once a week. Take with a full glass of water on an empty stomach.  . cholecalciferol (VITAMIN D-400) 10 MCG (400 UNIT) TABS tablet Take 400 Units by mouth.  . gabapentin (NEURONTIN) 300 MG capsule Take 1 capsule by mouth 4 (four) times daily. [name]  . levothyroxine (SYNTHROID) 100 MCG tablet Take 1 tablet (100 mcg total) by mouth daily.  . montelukast (SINGULAIR) 10 MG tablet Take 1 tablet (10 mg total) by mouth at bedtime.  . Multiple Vitamins-Minerals (PRESERVISION AREDS 2 PO) Take 2 capsules by mouth daily.  . Omega-3 1000 MG CAPS Take 1,000 mg by mouth daily.  . pantoprazole (PROTONIX) 40 MG tablet Take 1 tablet (40 mg total) by mouth  daily.  . potassium chloride (K-DUR) 10 MEQ tablet Take 1 tablet (10 mEq total) by mouth daily. (Patient taking differently: Take 10 mEq by mouth daily. Pt taking 1 tab 3 days per week only)  . rOPINIRole (REQUIP) 1 MG tablet Take 1 tablet (1 mg total) by mouth at bedtime.  . sulfaSALAzine (AZULFIDINE) 500 MG EC tablet Take 1 tablet by mouth 2 (two) times a day. GI  .  traZODone (DESYREL) 100 MG tablet Take 1 tablet (100 mg total) by mouth daily.  . vitamin B-12 (CYANOCOBALAMIN) 500 MCG tablet Take 500 mcg by mouth every 14 (fourteen) days.     PHQ 2/9 Scores 12/01/2018 11/03/2018 11/25/2017 07/16/2017  PHQ - 2 Score 0 0 0 2  PHQ- 9 Score 4 - 0 8    BP Readings from Last 3 Encounters:  06/07/19 130/70  12/01/18 110/80  11/03/18 112/72    Physical Exam Vitals and nursing note reviewed.  Constitutional:      Appearance: She is well-developed.  HENT:     Head: Normocephalic.     Right Ear: Tympanic membrane, ear canal and external ear normal.     Left Ear: Tympanic membrane, ear canal and external ear normal.     Nose: Nose normal.     Mouth/Throat:     Mouth: Mucous membranes are moist.  Eyes:     General: Lids are everted, no foreign bodies appreciated. No scleral icterus.       Left eye: No foreign body or hordeolum.     Conjunctiva/sclera: Conjunctivae normal.     Right eye: Right conjunctiva is not injected.     Left eye: Left conjunctiva is not injected.     Pupils: Pupils are equal, round, and reactive to light.  Neck:     Thyroid: No thyromegaly.     Vascular: No JVD.     Trachea: No tracheal deviation.  Cardiovascular:     Rate and Rhythm: Normal rate and regular rhythm.     Heart sounds: Normal heart sounds. No murmur. No friction rub. No gallop.   Pulmonary:     Effort: Pulmonary effort is normal. No respiratory distress.     Breath sounds: Normal breath sounds. No wheezing, rhonchi or rales.  Abdominal:     General: Bowel sounds are normal.     Palpations: Abdomen is soft. There is no mass.     Tenderness: There is no abdominal tenderness. There is no guarding or rebound.  Musculoskeletal:        General: No tenderness. Normal range of motion.     Cervical back: Normal range of motion and neck supple.  Lymphadenopathy:     Cervical: No cervical adenopathy.  Skin:    General: Skin is warm.     Findings: No rash.    Neurological:     Mental Status: She is alert and oriented to person, place, and time.     Cranial Nerves: No cranial nerve deficit.     Deep Tendon Reflexes: Reflexes normal.  Psychiatric:        Mood and Affect: Mood is not anxious or depressed.     Wt Readings from Last 3 Encounters:  06/07/19 147 lb (66.7 kg)  12/01/18 149 lb (67.6 kg)  11/03/18 151 lb (68.5 kg)    BP 130/70   Pulse 64   Ht 5' 3"  (1.6 m)   Wt 147 lb (66.7 kg)   LMP  (LMP Unknown) Comment: had hysterectomy  BMI 26.04 kg/m  Assessment and Plan: 1. Mild episode of recurrent major depressive disorder (HCC) Chronic.  Controlled.  Stable.  PHQ 0.  Continue trazodone 100 mg nightly for insomnia. - traZODone (DESYREL) 100 MG tablet; Take 1 tablet (100 mg total) by mouth daily.  Dispense: 90 tablet; Refill: 1  2. Restless leg Chronic.  Controlled.  Stable.  Patient doing well and tolerating Requip 1 mg at bedtime. - rOPINIRole (REQUIP) 1 MG tablet; Take 1 tablet (1 mg total) by mouth at bedtime.  Dispense: 90 tablet; Refill: 1  3. Hypokalemia Iatrogenic secondary to diuretic.  Will check CMP for evaluation of electrolytes and will continue potassium chloride 10 mEq 3 days a week. - potassium chloride (KLOR-CON) 10 MEQ tablet; Take 1 tablet (10 mEq total) by mouth daily. Pt taking 1 tab 3 days per week only  Dispense: 36 tablet; Refill: 1 - Comprehensive Metabolic Panel (CMET)  4. Gastroesophageal reflux disease Chronic patient has seasonal rhinitis continue pantoprazole 40 mg once a day.  5. Seasonal allergic rhinitis due to pollen Chronic.  Controlled.  Stable.  Patient will continue Singulair 10 mg once a day for stabilization since this is seasonal. - montelukast (SINGULAIR) 10 MG tablet; Take 1 tablet (10 mg total) by mouth at bedtime.  Dispense: 90 tablet; Refill: 1  6. Adult hypothyroidism Chronic.  Controlled.  Stable.  Symptoms are unchanged.  There is no enlargement of the thyroid and will  continue levothyroxine 100 mcg daily. - levothyroxine (SYNTHROID) 100 MCG tablet; Take 1 tablet (100 mcg total) by mouth daily.  Dispense: 90 tablet; Refill: 1 - TSH  7. Age-related osteoporosis with current pathological fracture, sequela Chronic.  Stable.  Continue Fosamax 70 mg once a week - alendronate (FOSAMAX) 70 MG tablet; Take 1 tablet (70 mg total) by mouth once a week. Take with a full glass of water on an empty stomach.  Dispense: 12 tablet; Refill: 1  8. Cigarette nicotine dependence without complication Patient has been advised of the health risks of smoking and counseled concerning cessation of tobacco products. I spent over 3 minutes for discussion and to answer questions.  9. Mixed hyperlipidemia Chronic.  Controlled.  Stable.  Continue omega-3.  Will check lipid panel. - Lipid Panel With LDL/HDL Ratio  10. Gastroesophageal reflux disease, unspecified whether esophagitis present As noted above this is stable we will continue pantoprazole - pantoprazole (PROTONIX) 40 MG tablet; Take 1 tablet (40 mg total) by mouth daily.  Dispense: 90 tablet; Refill: 1  11. Influenza vaccine needed Discussed and administered. - Flu Vaccine QUAD High Dose(Fluad)

## 2019-06-08 ENCOUNTER — Other Ambulatory Visit: Payer: Self-pay

## 2019-06-08 LAB — COMPREHENSIVE METABOLIC PANEL
ALT: 8 IU/L (ref 0–32)
AST: 17 IU/L (ref 0–40)
Albumin/Globulin Ratio: 2.5 — ABNORMAL HIGH (ref 1.2–2.2)
Albumin: 4.3 g/dL (ref 3.7–4.7)
Alkaline Phosphatase: 164 IU/L — ABNORMAL HIGH (ref 39–117)
BUN/Creatinine Ratio: 7 — ABNORMAL LOW (ref 12–28)
BUN: 6 mg/dL — ABNORMAL LOW (ref 8–27)
Bilirubin Total: 0.8 mg/dL (ref 0.0–1.2)
CO2: 23 mmol/L (ref 20–29)
Calcium: 9.4 mg/dL (ref 8.7–10.3)
Chloride: 105 mmol/L (ref 96–106)
Creatinine, Ser: 0.82 mg/dL (ref 0.57–1.00)
GFR calc Af Amer: 83 mL/min/{1.73_m2} (ref >59)
GFR calc non Af Amer: 72 mL/min/{1.73_m2} (ref >59)
Globulin, Total: 1.7 g/dL (ref 1.5–4.5)
Glucose: 89 mg/dL (ref 65–99)
Potassium: 4.3 mmol/L (ref 3.5–5.2)
Sodium: 143 mmol/L (ref 134–144)
Total Protein: 6 g/dL (ref 6.0–8.5)

## 2019-06-08 LAB — TSH: TSH: 0.076 u[IU]/mL — ABNORMAL LOW (ref 0.450–4.500)

## 2019-06-08 LAB — LIPID PANEL WITH LDL/HDL RATIO
Cholesterol, Total: 209 mg/dL — ABNORMAL HIGH (ref 100–199)
HDL: 43 mg/dL (ref >39)
LDL Chol Calc (NIH): 144 mg/dL — ABNORMAL HIGH (ref 0–99)
LDL/HDL Ratio: 3.3 ratio — ABNORMAL HIGH (ref 0.0–3.2)
Triglycerides: 123 mg/dL (ref 0–149)
VLDL Cholesterol Cal: 22 mg/dL (ref 5–40)

## 2019-06-08 MED ORDER — LEVOTHYROXINE SODIUM 88 MCG PO TABS: 88.0000 ug | ORAL_TABLET | Freq: Every day | ORAL | 1 refills | Status: DC

## 2019-07-04 ENCOUNTER — Other Ambulatory Visit: Payer: Self-pay

## 2019-07-04 ENCOUNTER — Ambulatory Visit (INDEPENDENT_AMBULATORY_CARE_PROVIDER_SITE_OTHER): Payer: Medicare Other | Admitting: Family Medicine

## 2019-07-04 ENCOUNTER — Encounter: Payer: Self-pay | Admitting: Family Medicine

## 2019-07-04 DIAGNOSIS — J0100 Acute maxillary sinusitis, unspecified: Secondary | ICD-10-CM

## 2019-07-04 MED ORDER — AMOXICILLIN 500 MG PO CAPS: 500.0000 mg | ORAL_CAPSULE | Freq: Three times a day (TID) | ORAL | 0 refills | Status: DC

## 2019-07-04 NOTE — Progress Notes (Signed)
Date:  07/04/2019   Name:  Brooke Juarez   DOB:  Jun 23, 1950   MRN:  [id number]   Chief Complaint: No chief complaint on file.  I connected withthis patient, Brooke Juarez, by telephoneat the patient's home.  I verified that I am speaking with the correct person using two identifiers. This visit was conducted via telephone due to the Covid-19 outbreak from my office at [facility] in [place], [place]. I discussed the limitations, risks, security and privacy concerns of performing an evaluation and management service by telephone. I also discussed with the patient that there may be a patient responsible charge related to this service. The patient expressed understanding and agreed to proceed.  Sinusitis This is a new problem. The current episode started in the past 7 days. The problem has been gradually worsening since onset. There has been no fever. Her pain is at a severity of 2/10 (facial pain). The pain is mild. Associated symptoms include congestion, ear pain, headaches, sinus pressure and sneezing. Pertinent negatives include no chills, coughing, diaphoresis, hoarse voice, neck pain, shortness of breath, sore throat or swollen glands. Past treatments include acetaminophen. The treatment provided mild relief.    Lab Results  Component Value Date   CREATININE 0.82 06/07/2019   BUN 6 (L) 06/07/2019   NA 143 06/07/2019   K 4.3 06/07/2019   CL 105 06/07/2019   CO2 23 06/07/2019   Lab Results  Component Value Date   CHOL 209 (H) 06/07/2019   HDL 43 06/07/2019   LDLCALC 144 (H) 06/07/2019   TRIG 123 06/07/2019   CHOLHDL 3.6 06/16/2017   Lab Results  Component Value Date   TSH 0.076 (L) 06/07/2019   No results found for: HGBA1C   Review of Systems  Constitutional: Negative.  Negative for chills, diaphoresis, fatigue, fever and unexpected weight change.  HENT: Positive for congestion, ear pain, sinus pressure and sneezing. Negative for ear discharge, hoarse voice, rhinorrhea  and sore throat.   Eyes: Negative for photophobia, pain, discharge, redness and itching.  Respiratory: Negative for cough, shortness of breath, wheezing and stridor.   Gastrointestinal: Negative for abdominal pain, blood in stool, constipation, diarrhea, nausea and vomiting.  Endocrine: Negative for cold intolerance, heat intolerance, polydipsia, polyphagia and polyuria.  Genitourinary: Negative for dysuria, flank pain, frequency, hematuria, menstrual problem, pelvic pain, urgency, vaginal bleeding and vaginal discharge.  Musculoskeletal: Negative for arthralgias, back pain, myalgias and neck pain.  Skin: Negative for rash.  Allergic/Immunologic: Negative for environmental allergies and food allergies.  Neurological: Positive for headaches. Negative for dizziness, weakness, light-headedness and numbness.  Hematological: Negative for adenopathy. Does not bruise/bleed easily.  Psychiatric/Behavioral: Negative for dysphoric mood. The patient is not nervous/anxious.     Patient Active Problem List   Diagnosis Date Noted  . Mixed hyperlipidemia 06/07/2019  . Irreducible right femoral hernia 04/28/2018  . Limited mobility with chronic pain & MS 04/05/2018  . Tobacco abuse 04/05/2018  . MS (multiple sclerosis) ([place])   . Hypokalemia 11/25/2017  . Seasonal allergic rhinitis due to pollen 12/08/2016  . CAD (coronary artery disease) 11/27/2014  . Medicare annual wellness visit, subsequent 11/27/2014  . Vitamin D deficiency 11/22/2014  . Familial multiple lipoprotein-type hyperlipidemia 09/29/2014  . Clinical depression 09/29/2014  . Acid reflux 09/29/2014  . Routine general medical examination at a health care facility 09/29/2014  . Restless leg 09/29/2014  . DS (disseminated sclerosis) ([place]) 09/29/2014  . Nicotine addiction 09/29/2014  . OP (osteoporosis) 09/29/2014  .  Adult hypothyroidism 10/05/2013  . Collagenous colitis 10/05/2013  . Hypothyroid 10/05/2013    Allergies  Allergen  Reactions  . Codeine Nausea And Vomiting    Past Surgical History:  Procedure Laterality Date  . APPENDECTOMY Left 04/14/2018   Procedure: APPENDECTOMY;  Surgeon: Olean Ree, MD;  Location: [facility];  Service: General;  Laterality: Left;  . CATARACT EXTRACTION W/PHACO Right 10/30/2014   Procedure: CATARACT EXTRACTION PHACO AND INTRAOCULAR LENS PLACEMENT ([place]);  Surgeon: Estill Cotta, MD;  Location: [facility];  Service: Ophthalmology;  Laterality: Right;  [place]  1:55.7   . CATARACT EXTRACTION W/PHACO Left 06/18/2015   Procedure: CATARACT EXTRACTION PHACO AND INTRAOCULAR LENS PLACEMENT ([facility]);  Surgeon: Estill Cotta, MD;  Location: [facility];  Service: Ophthalmology;  Laterality: Left;  [place] 01:36 AP% 26.1 CDE 42.19 fluid pack lo0t # [id number]  . COLONOSCOPY  2014   normal- Rein- repeat in 5 years  . EYE SURGERY    . FOOT SURGERY    . INGUINAL HERNIA REPAIR Right 04/14/2018   Procedure: HERNIA REPAIR INGUINAL;  Surgeon: Olean Ree, MD;  Location: [facility];  Service: General;  Laterality: Right;  . NASAL SINUS SURGERY    . TONSILLECTOMY    . TUBAL LIGATION    . VAGINAL HYSTERECTOMY      Social History   Tobacco Use  . Smoking status: Current Every Day Smoker    Packs/day: 0.75    Years: 55.00    Pack years: 41.25    Types: Cigarettes  . Smokeless tobacco: Never Used  Substance Use Topics  . Alcohol use: No    Alcohol/week: 0.0 standard drinks  . Drug use: No     Medication list has been reviewed and updated.  No outpatient medications have been marked as taking for the 07/04/19 encounter (Appointment) with Juline Patch, MD.    [facility] 2/9 Scores 12/01/2018 11/03/2018 11/25/2017 07/16/2017  PHQ - 2 Score 0 0 0 2  PHQ- 9 Score 4 - 0 8    BP Readings from Last 3 Encounters:  06/07/19 130/70  12/01/18 110/80  11/03/18 112/72    Physical Exam Vitals (afebrile) and nursing note reviewed.  HENT:     Nose:     Right Sinus: Maxillary sinus tenderness present.      Left Sinus: Maxillary sinus tenderness present.  Neurological:     Mental Status: She is alert.     Wt Readings from Last 3 Encounters:  06/07/19 147 lb (66.7 kg)  12/01/18 149 lb (67.6 kg)  11/03/18 151 lb (68.5 kg)    LMP  (LMP Unknown) Comment: had hysterectomy  Assessment and Plan: 1. Acute maxillary sinusitis, recurrence not specified Acute.  Persistent.  Stable.  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  Patient has a history of MS and conditions are such that it would be at risk for patient to be seen in the office today.  Physical evaluation by phone notes to be tender over the maxillary sinuses and history is consistent with a sinusitis.  We will initiate amoxicillin 500 mg 3 times a day for 10 days. - amoxicillin (AMOXIL) 500 MG capsule; Take 1 capsule (500 mg total) by mouth 3 (three) times daily.  Dispense: 30 capsule; Refill: 0  I spent 10 minutes with this patient, More than 50% of that time was spent in face to face education, counseling and care coordination.

## 2019-07-27 DIAGNOSIS — G2581 Restless legs syndrome: Secondary | ICD-10-CM | POA: Diagnosis not present

## 2019-07-27 DIAGNOSIS — R4189 Other symptoms and signs involving cognitive functions and awareness: Secondary | ICD-10-CM | POA: Diagnosis not present

## 2019-07-27 DIAGNOSIS — F5101 Primary insomnia: Secondary | ICD-10-CM | POA: Diagnosis not present

## 2019-07-27 DIAGNOSIS — R2689 Other abnormalities of gait and mobility: Secondary | ICD-10-CM | POA: Diagnosis not present

## 2019-08-09 DIAGNOSIS — H353211 Exudative age-related macular degeneration, right eye, with active choroidal neovascularization: Secondary | ICD-10-CM | POA: Diagnosis not present

## 2019-08-09 DIAGNOSIS — H353122 Nonexudative age-related macular degeneration, left eye, intermediate dry stage: Secondary | ICD-10-CM | POA: Diagnosis not present

## 2019-09-13 DIAGNOSIS — H353221 Exudative age-related macular degeneration, left eye, with active choroidal neovascularization: Secondary | ICD-10-CM | POA: Diagnosis not present

## 2019-09-13 DIAGNOSIS — H353211 Exudative age-related macular degeneration, right eye, with active choroidal neovascularization: Secondary | ICD-10-CM | POA: Diagnosis not present

## 2019-10-31 DIAGNOSIS — H353221 Exudative age-related macular degeneration, left eye, with active choroidal neovascularization: Secondary | ICD-10-CM | POA: Diagnosis not present

## 2019-10-31 DIAGNOSIS — H353211 Exudative age-related macular degeneration, right eye, with active choroidal neovascularization: Secondary | ICD-10-CM | POA: Diagnosis not present

## 2019-11-07 ENCOUNTER — Ambulatory Visit (INDEPENDENT_AMBULATORY_CARE_PROVIDER_SITE_OTHER): Payer: Medicare Other

## 2019-11-07 DIAGNOSIS — Z0000 Encounter for general adult medical examination without abnormal findings: Secondary | ICD-10-CM | POA: Diagnosis not present

## 2019-11-07 NOTE — Progress Notes (Signed)
Subjective:   Brooke Juarez is a 69 y.o. female who presents for Medicare Annual (Subsequent) preventive examination.  Virtual Visit via Telephone Note  I connected with  Brooke Juarez on 11/07/19 at 10:00 AM EDT by telephone and verified that I am speaking with the correct person using two identifiers.  Medicare Annual Wellness visit completed telephonically due to Covid-19 pandemic.   Location: Patient: home Provider: office   I discussed the limitations, risks, security and privacy concerns of performing an evaluation and management service by telephone and the availability of in person appointments. The patient expressed understanding and agreed to proceed.  Unable to perform video visit due to video visit attempted and failed and/or patient does not have video capability.   Some vital signs may be absent or patient reported.   Clemetine Marker, LPN    Review of Systems     Cardiac Risk Factors include: advanced age (>13mn, >>62women);dyslipidemia;sedentary lifestyle;smoking/ tobacco exposure     Objective:    Today's Vitals   11/07/19 0928  PainSc: 6    There is no height or weight on file to calculate BMI.  Advanced Directives 11/07/2019 11/03/2018 04/14/2018 04/04/2018 07/16/2017 11/27/2014  Does Patient Have a Medical Advance Directive? No No No No No No  Would patient like information on creating a medical advance directive? No - Patient declined Yes (MAU/Ambulatory/Procedural Areas - Information given) No - Patient declined - Yes (MAU/Ambulatory/Procedural Areas - Information given) No - patient declined information    Current Medications (verified) Outpatient Encounter Medications as of 11/07/2019  Medication Sig  . acetaminophen (TYLENOL) 500 MG tablet Take 1,000 mg by mouth 2 (two) times daily as needed for moderate pain or headache.  . alendronate (FOSAMAX) 70 MG tablet Take 1 tablet (70 mg total) by mouth once a week. Take with a full glass of water on an empty  stomach.  . cholecalciferol (VITAMIN D-400) 10 MCG (400 UNIT) TABS tablet Take 400 Units by mouth.  . levothyroxine (SYNTHROID) 88 MCG tablet Take 1 tablet (88 mcg total) by mouth daily.  . montelukast (SINGULAIR) 10 MG tablet Take 1 tablet (10 mg total) by mouth at bedtime.  . Multiple Vitamins-Minerals (PRESERVISION AREDS 2 PO) Take 2 capsules by mouth daily.  . Omega-3 1000 MG CAPS Take 1,000 mg by mouth daily.  . pantoprazole (PROTONIX) 40 MG tablet Take 1 tablet (40 mg total) by mouth daily.  . potassium chloride (KLOR-CON) 10 MEQ tablet Take 1 tablet (10 mEq total) by mouth daily. Pt taking 1 tab 3 days per week only  . rOPINIRole (REQUIP) 1 MG tablet Take 1.5 tablets by mouth every evening.  . sulfaSALAzine (AZULFIDINE) 500 MG EC tablet Take 1 tablet by mouth 2 (two) times daily before a meal.  . traZODone (DESYREL) 100 MG tablet Take 1 tablet (100 mg total) by mouth daily.  . vitamin B-12 (CYANOCOBALAMIN) 500 MCG tablet Take 500 mcg by mouth every 14 (fourteen) days.   .[name] (NEURONTIN) 300 MG capsule Take 1 capsule by mouth 4 (four) times daily. [name] . [DISCONTINUED] amoxicillin (AMOXIL) 500 MG capsule Take 1 capsule (500 mg total) by mouth 3 (three) times daily.  . [DISCONTINUED] ibuprofen (ADVIL,MOTRIN) 600 MG tablet Take 1 tablet (600 mg total) by mouth every 8 (eight) hours as needed for fever, mild pain or moderate pain. (Patient not taking: Reported on 06/07/2019)  . [DISCONTINUED] rOPINIRole (REQUIP) 1 MG tablet Take 1 tablet (1 mg total) by mouth at bedtime.  No facility-administered encounter medications on file as of 11/07/2019.    Allergies (verified) Codeine   History: Past Medical History:  Diagnosis Date  . Crohn disease ([place])   . Depression   . GERD (gastroesophageal reflux disease)   . Heart murmur   . Hyperlipidemia   . Hypothyroidism   . Macular degeneration   . MS (multiple sclerosis) ([place])   . Restless leg   . Seizures ([place])   . Tremors of  nervous system    Past Surgical History:  Procedure Laterality Date  . APPENDECTOMY Left 04/14/2018   Procedure: APPENDECTOMY;  Surgeon: Olean Ree, MD;  Location: [facility] ORS;  Service: General;  Laterality: Left;  . CATARACT EXTRACTION W/PHACO Right 10/30/2014   Procedure: CATARACT EXTRACTION PHACO AND INTRAOCULAR LENS PLACEMENT ([place]);  Surgeon: Estill Cotta, MD;  Location: [facility] ORS;  Service: Ophthalmology;  Laterality: Right;  [place]  1:55.7   . CATARACT EXTRACTION W/PHACO Left 06/18/2015   Procedure: CATARACT EXTRACTION PHACO AND INTRAOCULAR LENS PLACEMENT (IOC);  Surgeon: Estill Cotta, MD;  Location: [facility] ORS;  Service: Ophthalmology;  Laterality: Left;  [place] 01:36 AP% 26.1 CDE 42.19 fluid pack lo0t # [id number]  . COLONOSCOPY  2014   normal- Rein- repeat in 5 years  . EYE SURGERY    . FOOT SURGERY    . INGUINAL HERNIA REPAIR Right 04/14/2018   Procedure: HERNIA REPAIR INGUINAL;  Surgeon: Olean Ree, MD;  Location: [facility] ORS;  Service: General;  Laterality: Right;  . NASAL SINUS SURGERY    . TONSILLECTOMY    . TUBAL LIGATION    . VAGINAL HYSTERECTOMY     Family History  Problem Relation Age of Onset  . Heart disease Mother   . Stroke Mother   . Cancer Mother        colon  . Stroke Father   . Breast cancer Neg Hx    Social History   Socioeconomic History  . Marital status: Married    Spouse name: Not on file  . Number of children: 3  . Years of education: some college  . Highest education level: 12th grade  Occupational History  . Occupation: Retired  Tobacco Use  . Smoking status: Current Every Day Smoker    Packs/day: 0.75    Years: 55.00    Pack years: 41.25    Types: Cigarettes  . Smokeless tobacco: Never Used  Vaping Use  . Vaping Use: Never used  Substance and Sexual Activity  . Alcohol use: No    Alcohol/week: 0.0 standard drinks  . Drug use: No  . Sexual activity: Not on file  Other Topics Concern  . Not on file  Social History Narrative  .  Not on file   Social Determinants of Health   Financial Resource Strain: Low Risk   . Difficulty of Paying Living Expenses: Not hard at all  Food Insecurity: No Food Insecurity  . Worried About Charity fundraiser in the Last Year: Never true  . Ran Out of Food in the Last Year: Never true  Transportation Needs: No Transportation Needs  . Lack of Transportation (Medical): No  . Lack of Transportation (Non-Medical): No  Physical Activity: Inactive  . Days of Exercise per Week: 0 days  . Minutes of Exercise per Session: 0 min  Stress: No Stress Concern Present  . Feeling of Stress : Only a little  Social Connections: Moderately Integrated  . Frequency of Communication with Friends and Family: More than three times a week  .  Frequency of Social Gatherings with Friends and Family: Three times a week  . Attends Religious Services: More than 4 times per year  . Active Member of Clubs or Organizations: No  . Attends Archivist Meetings: Never  . Marital Status: Married    Tobacco Counseling Ready to quit: No Counseling given: Not Answered   Clinical Intake:  Pre-visit preparation completed: Yes  Pain : 0-10 Pain Score: 6  Pain Type: Chronic pain Pain Location: Neck (also arms/legs) Pain Descriptors / Indicators: Aching Pain Onset: More than a month ago Pain Frequency: Constant     Nutritional Risks: None Diabetes: No  How often do you need to have someone help you when you read instructions, pamphlets, or other written materials from your doctor or pharmacy?: 1 - Never    Interpreter Needed?: No  Information entered by :: Clemetine Marker LPN   Activities of Daily Living In your present state of health, do you have any difficulty performing the following activities: 11/07/2019  Hearing? N  Comment declines hearing aids  Vision? Y  Difficulty concentrating or making decisions? Y  Walking or climbing stairs? Y  Dressing or bathing? Y  Doing errands,  shopping? Y  Preparing Food and eating ? N  Using the Toilet? N  In the past six months, have you accidently leaked urine? Y  Comment wears depends if needed  Do you have problems with loss of bowel control? N  Managing your Medications? N  Managing your Finances? N  Housekeeping or managing your Housekeeping? N  Some recent data might be hidden    Patient Care Team: Juline Patch, MD as PCP - General (Family Medicine) Vladimir Crofts, MD as Consulting Physician (Neurology) Chauncey Mann, MD as Consulting Physician (Psychiatry) Michael Boston, MD as Consulting Physician (General Surgery) Manya Silvas, MD (Inactive) as Consulting Physician (Gastroenterology)  Indicate any recent Medical Services you may have received from other than [facility] providers in the past year (date may be approximate).     Assessment:   This is a routine wellness examination for Tomasita.  Hearing/Vision screen  Hearing Screening   125Hz  250Hz  500Hz  1000Hz  2000Hz  3000Hz  4000Hz  6000Hz  8000Hz   Right ear:           Left ear:           Comments: Pt denies hearing difficulty  Vision Screening Comments: [facility] screenings [facility] Dr. Thomasene Ripple  Dietary issues and exercise activities discussed: Current Exercise Habits: The patient does not participate in regular exercise at present, Exercise limited by: neurologic condition(s);orthopedic condition(s)  Goals    . DIET - INCREASE WATER INTAKE     Recommend to drink at least 6-8 8oz glasses of water per day.      Depression Screen PHQ 2/9 Scores 11/07/2019 12/01/2018 11/03/2018 11/25/2017 07/16/2017 12/08/2016 12/03/2015  PHQ - 2 Score 0 0 0 0 2 0 0  PHQ- 9 Score - 4 - 0 8 - -    Fall Risk Fall Risk  11/07/2019 11/03/2018 04/28/2018 04/13/2018 07/16/2017  Falls in the past year? 0 0 0 0 No  Number falls in past yr: 0 0 0 - -  Injury with Fall? 0 0 0 - -  Risk for fall due to : Impaired balance/gait;Impaired mobility;Impaired vision -  - - History of fall(s);Impaired balance/gait;Impaired mobility;Impaired vision  Risk for fall due to: Comment - - - - MS, wears eyeglasses  Follow up Falls prevention discussed Falls prevention  discussed - - -    Any stairs in or around the home? Yes  If so, are there any without handrails? Yes  Home free of loose throw rugs in walkways, pet beds, electrical cords, etc? Yes  Adequate lighting in your home to reduce risk of falls? Yes   ASSISTIVE DEVICES UTILIZED TO PREVENT FALLS:  Life alert? No  Use of a cane, walker or w/c? Yes  Grab bars in the bathroom? Yes  Shower chair or bench in shower? Yes  Elevated toilet seat or a handicapped toilet? Yes   TIMED UP AND GO:  Was the test performed? No . Telephonic visit  Cognitive Function:     6CIT Screen 11/07/2019 11/03/2018 07/16/2017  What Year? 0 points 0 points 0 points  What month? 0 points 0 points 0 points  What time? 0 points 0 points 0 points  Count back from 20 0 points 0 points 0 points  Months in reverse 0 points 0 points 0 points  Repeat phrase 0 points 0 points 0 points  Total Score 0 0 0    Immunizations Immunization History  Administered Date(s) Administered  . Fluad Quad(high Dose 65+) 06/07/2019  . Influenza, High Dose Seasonal PF 04/09/2018  . Influenza,inj,Quad PF,6+ Mos 06/16/2017  . PFIZER SARS-COV-2 Vaccination 07/11/2019, 08/01/2019  . Pneumococcal Conjugate-13 05/25/2014  . Pneumococcal Polysaccharide-23 05/20/2007, 05/25/2014, 12/08/2016  . Tdap 05/20/2007    TDAP status: Due, Education has been provided regarding the importance of this vaccine. Advised may receive this vaccine at local pharmacy or Health Dept. Aware to provide a copy of the vaccination record if obtained from local pharmacy or Health Dept. Verbalized acceptance and understanding. Flu Vaccine status: Up to date Pneumococcal vaccine status: Up to date Covid-19 vaccine status: Completed vaccines  Qualifies for Shingles Vaccine? Yes    Zostavax completed No   Shingrix Completed?: No.    Education has been provided regarding the importance of this vaccine. Patient has been advised to call insurance company to determine out of pocket expense if they have not yet received this vaccine. Advised may also receive vaccine at local pharmacy or Health Dept. Verbalized acceptance and understanding.  Screening Tests Health Maintenance  Topic Date Due  . PNA vac Low Risk Adult (2 of 2 - PCV13) 12/08/2017  . MAMMOGRAM  07/31/2018  . TETANUS/TDAP  06/06/2020 (Originally 05/19/2017)  . INFLUENZA VACCINE  12/18/2019  . COLONOSCOPY  01/18/2023  . DEXA SCAN  Completed  . COVID-19 Vaccine  Completed  . Hepatitis C Screening  Completed    Health Maintenance  Health Maintenance Due  Topic Date Due  . PNA vac Low Risk Adult (2 of 2 - PCV13) 12/08/2017  . MAMMOGRAM  07/31/2018    Colorectal cancer screening: Completed 01/28/13. Repeat every 10 years   Mammogram status: Completed 07/30/17. Repeat every year . Pt declines repeat screening at this time.   Bone Density status: Completed 06/27/14. Results reflect: Bone density results: OSTEOPOROSIS. Repeat every 2 years.  Lung Cancer Screening: (Low Dose CT Chest recommended if Age 72-80 years, 30 pack-year currently smoking OR have quit w/in 15years.) does not qualify.   Additional Screening:  Hepatitis C Screening: does qualify; Completed 12/08/16  Vision Screening: Recommended annual ophthalmology exams for early detection of glaucoma and other disorders of the eye. Is the patient up to date with their annual eye exam?  Yes  Who is the provider or what is the name of the office in which the patient attends annual  eye exams? Dr. Thomasene Ripple  Dental Screening: Recommended annual dental exams for proper oral hygiene  Community Resource Referral / Chronic Care Management: CRR required this visit?  No   CCM required this visit?  No      Plan:     I have personally reviewed and  noted the following in the patient's chart:   . Medical and social history . Use of alcohol, tobacco or illicit drugs  . Current medications and supplements . Functional ability and status . Nutritional status . Physical activity . Advanced directives . List of other physicians . Hospitalizations, surgeries, and ER visits in previous 12 months . Vitals . Screenings to include cognitive, depression, and falls . Referrals and appointments  In addition, I have reviewed and discussed with patient certain preventive protocols, quality metrics, and best practice recommendations. A written personalized care plan for preventive services as well as general preventive health recommendations were provided to patient.     Clemetine Marker, LPN   [id number]   Nurse Notes: pt states she had a seizure today but doing okay. Pt's husband states  They are planning to see neurology where she is already established to further discuss seizures. Pt very pleasant and appreciative of visit today.

## 2019-11-07 NOTE — Patient Instructions (Signed)
[name] , Thank you for taking time to come for your Medicare Wellness Visit. I appreciate your ongoing commitment to your health goals. Please review the following plan we discussed and let me know if I can assist you in the future.   Screening recommendations/referrals: Colonoscopy: done 01/28/13. Due 2024. Mammogram: done 07/30/17. DUE.  Bone Density: done 06/27/14 Recommended yearly ophthalmology/optometry visit for glaucoma screening and checkup Recommended yearly dental visit for hygiene and checkup  Vaccinations: Influenza vaccine: done 06/07/19 Pneumococcal vaccine: done 12/08/16 Tdap vaccine: DUE Shingles vaccine: Shingrix discussed. Please contact your pharmacy for coverage information.  Covid-19:done 07/11/19 & 08/01/19  Advanced directives: Advance directive discussed with you today. Even though you declined this today please call our office should you change your mind and we can give you the proper paperwork for you to fill out.  Conditions/risks identified: Recommend drinking 6-8 glasses of water per day  Next appointment: Follow up in one year for your annual wellness visit    Preventive Care 65 Years and Older, Female Preventive care refers to lifestyle choices and visits with your health care provider that can promote health and wellness. What does preventive care include?  A yearly physical exam. This is also called an annual well check.  Dental exams once or twice a year.  Routine eye exams. Ask your health care provider how often you should have your eyes checked.  Personal lifestyle choices, including:  Daily care of your teeth and gums.  Regular physical activity.  Eating a healthy diet.  Avoiding tobacco and drug use.  Limiting alcohol use.  Practicing safe sex.  Taking low-dose aspirin every day.  Taking vitamin and mineral supplements as recommended by your health care provider. What happens during an annual well check? The services and  screenings done by your health care provider during your annual well check will depend on your age, overall health, lifestyle risk factors, and family history of disease. Counseling  Your health care provider may ask you questions about your:  Alcohol use.  Tobacco use.  Drug use.  Emotional well-being.  Home and relationship well-being.  Sexual activity.  Eating habits.  History of falls.  Memory and ability to understand (cognition).  Work and work Statistician.  Reproductive health. Screening  You may have the following tests or measurements:  Height, weight, and BMI.  Blood pressure.  Lipid and cholesterol levels. These may be checked every 5 years, or more frequently if you are over 24 years old.  Skin check.  Lung cancer screening. You may have this screening every year starting at age 71 if you have a 30-pack-year history of smoking and currently smoke or have quit within the past 15 years.  Fecal occult blood test (FOBT) of the stool. You may have this test every year starting at age 71.  Flexible sigmoidoscopy or colonoscopy. You may have a sigmoidoscopy every 5 years or a colonoscopy every 10 years starting at age 71.  Hepatitis C blood test.  Hepatitis B blood test.  Sexually transmitted disease (STD) testing.  Diabetes screening. This is done by checking your blood sugar (glucose) after you have not eaten for a while (fasting). You may have this done every 1-3 years.  Bone density scan. This is done to screen for osteoporosis. You may have this done starting at age 71.  Mammogram. This may be done every 1-2 years. Talk to your health care provider about how often you should have regular mammograms. Talk with your health care  provider about your test results, treatment options, and if necessary, the need for more tests. Vaccines  Your health care provider may recommend certain vaccines, such as:  Influenza vaccine. This is recommended every  year.  Tetanus, diphtheria, and acellular pertussis (Tdap, Td) vaccine. You may need a Td booster every 10 years.  Zoster vaccine. You may need this after age 71.  Pneumococcal 13-valent conjugate (PCV13) vaccine. One dose is recommended after age 71.  Pneumococcal polysaccharide (PPSV23) vaccine. One dose is recommended after age 71. Talk to your health care provider about which screenings and vaccines you need and how often you need them. This information is not intended to replace advice given to you by your health care provider. Make sure you discuss any questions you have with your health care provider. Document Released: 06/01/2015 Document Revised: 01/23/2016 Document Reviewed: 03/06/2015 Elsevier Interactive Patient Education  2017 [place] Prevention in the Home Falls can cause injuries. They can happen to people of all ages. There are many things you can do to make your home safe and to help prevent falls. What can I do on the outside of my home?  Regularly fix the edges of walkways and driveways and fix any cracks.  Remove anything that might make you trip as you walk through a door, such as a raised step or threshold.  Trim any bushes or trees on the path to your home.  Use bright outdoor lighting.  Clear any walking paths of anything that might make someone trip, such as rocks or tools.  Regularly check to see if handrails are loose or broken. Make sure that both sides of any steps have handrails.  Any raised decks and porches should have guardrails on the edges.  Have any leaves, snow, or ice cleared regularly.  Use sand or salt on walking paths during winter.  Clean up any spills in your garage right away. This includes oil or grease spills. What can I do in the bathroom?  Use night lights.  Install grab bars by the toilet and in the tub and shower. Do not use towel bars as grab bars.  Use non-skid mats or decals in the tub or shower.  If you  need to sit down in the shower, use a plastic, non-slip stool.  Keep the floor dry. Clean up any water that spills on the floor as soon as it happens.  Remove soap buildup in the tub or shower regularly.  Attach bath mats securely with double-sided non-slip rug tape.  Do not have throw rugs and other things on the floor that can make you trip. What can I do in the bedroom?  Use night lights.  Make sure that you have a light by your bed that is easy to reach.  Do not use any sheets or blankets that are too big for your bed. They should not hang down onto the floor.  Have a firm chair that has side arms. You can use this for support while you get dressed.  Do not have throw rugs and other things on the floor that can make you trip. What can I do in the kitchen?  Clean up any spills right away.  Avoid walking on wet floors.  Keep items that you use a lot in easy-to-reach places.  If you need to reach something above you, use a strong step stool that has a grab bar.  Keep electrical cords out of the way.  Do not use floor polish  or wax that makes floors slippery. If you must use wax, use non-skid floor wax.  Do not have throw rugs and other things on the floor that can make you trip. What can I do with my stairs?  Do not leave any items on the stairs.  Make sure that there are handrails on both sides of the stairs and use them. Fix handrails that are broken or loose. Make sure that handrails are as long as the stairways.  Check any carpeting to make sure that it is firmly attached to the stairs. Fix any carpet that is loose or worn.  Avoid having throw rugs at the top or bottom of the stairs. If you do have throw rugs, attach them to the floor with carpet tape.  Make sure that you have a light switch at the top of the stairs and the bottom of the stairs. If you do not have them, ask someone to add them for you. What else can I do to help prevent falls?  Wear shoes  that:  Do not have high heels.  Have rubber bottoms.  Are comfortable and fit you well.  Are closed at the toe. Do not wear sandals.  If you use a stepladder:  Make sure that it is fully opened. Do not climb a closed stepladder.  Make sure that both sides of the stepladder are locked into place.  Ask someone to hold it for you, if possible.  Clearly mark and make sure that you can see:  Any grab bars or handrails.  First and last steps.  Where the edge of each step is.  Use tools that help you move around (mobility aids) if they are needed. These include:  Canes.  Walkers.  Scooters.  Crutches.  Turn on the lights when you go into a dark area. Replace any light bulbs as soon as they burn out.  Set up your furniture so you have a clear path. Avoid moving your furniture around.  If any of your floors are uneven, fix them.  If there are any pets around you, be aware of where they are.  Review your medicines with your doctor. Some medicines can make you feel dizzy. This can increase your chance of falling. Ask your doctor what other things that you can do to help prevent falls. This information is not intended to replace advice given to you by your health care provider. Make sure you discuss any questions you have with your health care provider. Document Released: 03/01/2009 Document Revised: 10/11/2015 Document Reviewed: 06/09/2014 Elsevier Interactive Patient Education  2017 [employer].

## 2019-11-25 ENCOUNTER — Other Ambulatory Visit: Payer: Self-pay | Admitting: Internal Medicine

## 2019-12-05 DIAGNOSIS — K52831 Collagenous colitis: Secondary | ICD-10-CM | POA: Diagnosis not present

## 2019-12-05 DIAGNOSIS — K219 Gastro-esophageal reflux disease without esophagitis: Secondary | ICD-10-CM | POA: Diagnosis not present

## 2019-12-05 DIAGNOSIS — R1312 Dysphagia, oropharyngeal phase: Secondary | ICD-10-CM | POA: Diagnosis not present

## 2019-12-05 DIAGNOSIS — Z800 Family history of malignant neoplasm of digestive organs: Secondary | ICD-10-CM | POA: Diagnosis not present

## 2019-12-07 ENCOUNTER — Other Ambulatory Visit: Payer: Self-pay

## 2019-12-07 ENCOUNTER — Encounter: Payer: Self-pay | Admitting: Family Medicine

## 2019-12-07 ENCOUNTER — Ambulatory Visit (INDEPENDENT_AMBULATORY_CARE_PROVIDER_SITE_OTHER): Payer: Medicare Other | Admitting: Family Medicine

## 2019-12-07 VITALS — BP 112/70 | HR 68 | Ht 63.0 in | Wt 149.0 lb

## 2019-12-07 DIAGNOSIS — F330 Major depressive disorder, recurrent, mild: Secondary | ICD-10-CM

## 2019-12-07 DIAGNOSIS — E039 Hypothyroidism, unspecified: Secondary | ICD-10-CM | POA: Diagnosis not present

## 2019-12-07 DIAGNOSIS — J301 Allergic rhinitis due to pollen: Secondary | ICD-10-CM

## 2019-12-07 DIAGNOSIS — E876 Hypokalemia: Secondary | ICD-10-CM

## 2019-12-07 DIAGNOSIS — F5101 Primary insomnia: Secondary | ICD-10-CM | POA: Diagnosis not present

## 2019-12-07 DIAGNOSIS — M8000XS Age-related osteoporosis with current pathological fracture, unspecified site, sequela: Secondary | ICD-10-CM | POA: Diagnosis not present

## 2019-12-07 DIAGNOSIS — G35 Multiple sclerosis: Secondary | ICD-10-CM

## 2019-12-07 DIAGNOSIS — K219 Gastro-esophageal reflux disease without esophagitis: Secondary | ICD-10-CM | POA: Diagnosis not present

## 2019-12-07 DIAGNOSIS — I700 Atherosclerosis of aorta: Secondary | ICD-10-CM | POA: Diagnosis not present

## 2019-12-07 DIAGNOSIS — E782 Mixed hyperlipidemia: Secondary | ICD-10-CM | POA: Diagnosis not present

## 2019-12-07 MED ORDER — POTASSIUM CHLORIDE ER 10 MEQ PO TBCR: 10.0000 meq | EXTENDED_RELEASE_TABLET | Freq: Every day | ORAL | 1 refills | Status: DC

## 2019-12-07 MED ORDER — TRAZODONE HCL 100 MG PO TABS: 100.0000 mg | ORAL_TABLET | Freq: Every day | ORAL | 1 refills | Status: DC

## 2019-12-07 MED ORDER — LEVOTHYROXINE SODIUM 88 MCG PO TABS: 88.0000 ug | ORAL_TABLET | Freq: Every day | ORAL | 1 refills | Status: DC

## 2019-12-07 MED ORDER — ALENDRONATE SODIUM 70 MG PO TABS: 70.0000 mg | ORAL_TABLET | ORAL | 1 refills | Status: DC

## 2019-12-07 MED ORDER — MONTELUKAST SODIUM 10 MG PO TABS: 10.0000 mg | ORAL_TABLET | Freq: Every day | ORAL | 1 refills | Status: DC

## 2019-12-07 MED ORDER — PANTOPRAZOLE SODIUM 40 MG PO TBEC: 40.0000 mg | DELAYED_RELEASE_TABLET | Freq: Every day | ORAL | 1 refills | Status: DC

## 2019-12-07 NOTE — Progress Notes (Signed)
Date:  12/07/2019   Name:  Brooke Juarez   DOB:  12-15-1950   MRN:  [id number]   Chief Complaint: Hypothyroidism, Gastroesophageal Reflux, Osteoporosis, Insomnia, and hypokalemia  Gastroesophageal Reflux She reports no abdominal pain, no belching, no chest pain, no choking, no coughing, no dysphagia, no early satiety, no globus sensation, no heartburn, no hoarse voice, no nausea, no sore throat, no stridor or no wheezing. This is a chronic problem. The current episode started more than 1 year ago. The problem has been gradually improving. The symptoms are aggravated by certain foods. Pertinent negatives include no anemia, fatigue, melena, muscle weakness, orthopnea or weight loss. The treatment provided moderate relief.  Insomnia Primary symptoms: difficulty falling asleep.  The problem has been gradually improving since onset. The treatment provided moderate relief. PMH includes: no hypertension, restless leg syndrome.  Thyroid Problem Presents for follow-up visit. Patient reports no anxiety, cold intolerance, constipation, depressed mood, diaphoresis, diarrhea, dry skin, fatigue, hair loss, heat intolerance, hoarse voice, leg swelling, menstrual problem, nail problem, palpitations, tremors, visual change or weight loss. The symptoms have been stable.    Lab Results  Component Value Date   CREATININE 0.82 06/07/2019   BUN 6 (L) 06/07/2019   NA 143 06/07/2019   K 4.3 06/07/2019   CL 105 06/07/2019   CO2 23 06/07/2019   Lab Results  Component Value Date   CHOL 209 (H) 06/07/2019   HDL 43 06/07/2019   LDLCALC 144 (H) 06/07/2019   TRIG 123 06/07/2019   CHOLHDL 3.6 06/16/2017   Lab Results  Component Value Date   TSH 0.076 (L) 06/07/2019   No results found for: HGBA1C Lab Results  Component Value Date   WBC 10.0 04/04/2018   HGB 12.5 04/04/2018   HCT 39.7 04/04/2018   MCV 102.6 (H) 04/04/2018   PLT 152 04/04/2018   Lab Results  Component Value Date   ALT 8 06/07/2019    AST 17 06/07/2019   ALKPHOS 164 (H) 06/07/2019   BILITOT 0.8 06/07/2019     Review of Systems  Constitutional: Negative.  Negative for chills, diaphoresis, fatigue, fever, unexpected weight change and weight loss.  HENT: Negative for congestion, ear discharge, ear pain, hoarse voice, postnasal drip, rhinorrhea, sinus pressure, sneezing and sore throat.   Eyes: Negative for photophobia, pain, discharge, redness and itching.  Respiratory: Negative for cough, choking, shortness of breath, wheezing and stridor.   Cardiovascular: Negative for chest pain and palpitations.  Gastrointestinal: Negative for abdominal pain, blood in stool, constipation, diarrhea, dysphagia, heartburn, melena, nausea and vomiting.  Endocrine: Negative for cold intolerance, heat intolerance, polydipsia, polyphagia and polyuria.  Genitourinary: Negative for dysuria, flank pain, frequency, hematuria, menstrual problem, pelvic pain, urgency, vaginal bleeding and vaginal discharge.  Musculoskeletal: Negative for arthralgias, back pain, myalgias and muscle weakness.  Skin: Negative for rash.  Allergic/Immunologic: Negative for environmental allergies and food allergies.  Neurological: Negative for dizziness, tremors, weakness, light-headedness, numbness and headaches.  Hematological: Negative for adenopathy. Does not bruise/bleed easily.  Psychiatric/Behavioral: Negative for dysphoric mood. The patient has insomnia. The patient is not nervous/anxious.     Patient Active Problem List   Diagnosis Date Noted  . Mixed hyperlipidemia 06/07/2019  . Irreducible right femoral hernia 04/28/2018  . Limited mobility with chronic pain & MS 04/05/2018  . Tobacco abuse 04/05/2018  . MS (multiple sclerosis) ([place])   . Hypokalemia 11/25/2017  . Seasonal allergic rhinitis due to pollen 12/08/2016  . CAD (coronary artery disease) 11/27/2014  .  Medicare annual wellness visit, subsequent 11/27/2014  . Vitamin D deficiency 11/22/2014  .  Familial multiple lipoprotein-type hyperlipidemia 09/29/2014  . Clinical depression 09/29/2014  . Acid reflux 09/29/2014  . Routine general medical examination at a health care facility 09/29/2014  . Restless leg 09/29/2014  . DS (disseminated sclerosis) ([place]) 09/29/2014  . Nicotine addiction 09/29/2014  . OP (osteoporosis) 09/29/2014  . Adult hypothyroidism 10/05/2013  . Collagenous colitis 10/05/2013  . Hypothyroid 10/05/2013    Allergies  Allergen Reactions  . Codeine Nausea And Vomiting    Past Surgical History:  Procedure Laterality Date  . APPENDECTOMY Left 04/14/2018   Procedure: APPENDECTOMY;  Surgeon: Olean Ree, MD;  Location: [facility] ORS;  Service: General;  Laterality: Left;  . CATARACT EXTRACTION W/PHACO Right 10/30/2014   Procedure: CATARACT EXTRACTION PHACO AND INTRAOCULAR LENS PLACEMENT ([place]);  Surgeon: Estill Cotta, MD;  Location: [facility] OR[facility];  Service: Ophthalmology;  Laterality: Right;  [place]  1:55.7   . CATARACT EXTRACTION W/PHACO Left 06/18/2015   Procedure: CATARACT EXTRACTION PHACO AND INTRAOCULAR LENS PLACEMENT (I[facility]);  Surgeon: Estill Cotta, MD;  Location: [facility] OR[facility];  Service: Ophthalmology;  Laterality: Left;  [place] 01:36 AP% 26.1 CDE 42.19 fluid pack lo0t # [id number]  . COLONOSCOPY  2014   normal- Rein- repeat in 5 years  . EYE SURGERY    . FOOT SURGERY    . INGUINAL HERNIA REPAIR Right 04/14/2018   Procedure: HERNIA REPAIR INGUINAL;  Surgeon: Olean Ree, MD;  Location: [facility] OR[facility];  Service: General;  Laterality: Right;  . NASAL SINUS SURGERY    . TONSILLECTOMY    . TUBAL LIGATION    . VAGINAL HYSTERECTOMY      Social History   Tobacco Use  . Smoking status: Current Every Day Smoker    Packs/day: 0.75    Years: 55.00    Pack years: 41.25    Types: Cigarettes  . Smokeless tobacco: Never Used  Vaping Use  . Vaping Use: Never used  Substance Use Topics  . Alcohol use: No    Alcohol/week: 0.0 standard drinks  . Drug use: No      Medication list has been reviewed and updated.  Current Meds  Medication Sig  . acetaminophen (TYLENOL) 500 MG tablet Take 1,000 mg by mouth 2 (two) times daily as needed for moderate pain or headache.  . alendronate (FOSAMAX) 70 MG tablet Take 1 tablet (70 mg total) by mouth once a week. Take with a full glass of water on an empty stomach.  . cholecalciferol (VITAMIN D-400) 10 MCG (400 UNIT) TABS tablet Take 400 Units by mouth.  Arna Medici 88 MCG tablet Take 1 tablet by mouth once daily  . gabapentin (NEURONTIN) 300 MG capsule Take 1 capsule by mouth 4 (four) times daily. [name]  . montelukast (SINGULAIR) 10 MG tablet Take 1 tablet (10 mg total) by mouth at bedtime.  . Multiple Vitamins-Minerals (PRESERVISION AREDS 2 PO) Take 2 capsules by mouth daily.  . Omega-3 1000 MG CAPS Take 1,000 mg by mouth daily.  . pantoprazole (PROTONIX) 40 MG tablet Take 1 tablet (40 mg total) by mouth daily.  . potassium chloride (KLOR-CON) 10 MEQ tablet Take 1 tablet (10 mEq total) by mouth daily. Pt taking 1 tab 3 days per week only  . rOPINIRole (REQUIP) 1 MG tablet Take 1.5 tablets by mouth every evening.  . traZODone (DESYREL) 100 MG tablet Take 1 tablet (100 mg total) by mouth daily.  . vitamin B-12 (CYANOCOBALAMIN) 500 MCG tablet Take 500  mcg by mouth every 14 (fourteen) days.     PHQ 2/9 Scores 12/07/2019 11/07/2019 12/01/2018 11/03/2018  PHQ - 2 Score 0 0 0 0  PHQ- 9 Score 0 - 4 -    GAD 7 : Generalized Anxiety Score 12/07/2019  Nervous, Anxious, on Edge 0  Control/stop worrying 0  Worry too much - different things 0  Trouble relaxing 0  Restless 0  Easily annoyed or irritable 0  Afraid - awful might happen 0  Total GAD 7 Score 0    BP Readings from Last 3 Encounters:  12/07/19 112/70  06/07/19 130/70  12/01/18 110/80    Physical Exam Vitals and nursing note reviewed.  Constitutional:      General: She is not in acute distress.    Appearance: She is not diaphoretic.  HENT:      Head: Normocephalic and atraumatic.     Right Ear: External ear normal.     Left Ear: External ear normal.     Nose: Nose normal. No congestion or rhinorrhea.     Mouth/Throat:     Mouth: Mucous membranes are moist.  Eyes:     General:        Right eye: No discharge.        Left eye: No discharge.     Conjunctiva/sclera: Conjunctivae normal.     Pupils: Pupils are equal, round, and reactive to light.  Neck:     Thyroid: No thyromegaly.     Vascular: No JVD.  Cardiovascular:     Rate and Rhythm: Normal rate and regular rhythm.     Heart sounds: Normal heart sounds. No murmur heard.  No friction rub. No gallop.   Pulmonary:     Effort: Pulmonary effort is normal.     Breath sounds: Normal breath sounds. No wheezing, rhonchi or rales.  Abdominal:     General: Bowel sounds are normal.     Palpations: Abdomen is soft. There is no mass.     Tenderness: There is no abdominal tenderness. There is no guarding or rebound.  Musculoskeletal:        General: Normal range of motion.     Cervical back: Normal range of motion and neck supple.  Lymphadenopathy:     Cervical: No cervical adenopathy.  Skin:    General: Skin is warm and dry.  Neurological:     General: No focal deficit present.     Mental Status: She is alert.     Deep Tendon Reflexes: Reflexes are normal and symmetric.     Wt Readings from Last 3 Encounters:  12/07/19 149 lb (67.6 kg)  06/07/19 147 lb (66.7 kg)  12/01/18 149 lb (67.6 kg)    BP 112/70   Pulse 68   Ht 5' 3"  (1.6 m)   Wt 149 lb (67.6 kg)   LMP  (LMP Unknown) Comment: had hysterectomy  BMI 26.39 kg/m   Assessment and Plan: 1. Adult hypothyroidism Chronic.  Controlled.  Stable.  Uncomplicated.  We will continue levothyroxine 88 mcg daily and will check TSH with thyroid panel for current level of surveillance. - Thyroid Panel With TSH - levothyroxine (EUTHYROX) 88 MCG tablet; Take 1 tablet (88 mcg total) by mouth daily.  Dispense: 90 tablet; Refill:  1  2. Primary insomnia Chronic.  Controlled.  Stable.  Continue trazodone nightly for fragmented and initiation of sleep.  3. Gastroesophageal reflux disease, unspecified whether esophagitis present Chronic.  Controlled.  Stable.  Continue pantoprazole 40 mg once  a day. - pantoprazole (PROTONIX) 40 MG tablet; Take 1 tablet (40 mg total) by mouth daily.  Dispense: 90 tablet; Refill: 1  4. Hypokalemia Iatrogenic.  Controlled.  Patient is currently on diuretic.  We will continue potassium supplementation at 10 mEq daily and will check metabolic panel for electrolyte. - potassium chloride (KLOR-CON) 10 MEQ tablet; Take 1 tablet (10 mEq total) by mouth daily. Pt taking 1 tab 3 days per week only  Dispense: 36 tablet; Refill: 1  5. Mixed hyperlipidemia Chronic.  Controlled.  Stable.  Continue omega-3 1 g capsule daily - Lipid Panel With LDL/HDL Ratio  6. Age-related osteoporosis with current pathological fracture, sequela Chronic.  Controlled.  Stable.  Patient is on medications for her MS which affects bone density and we will continue Fosamax 70 mg weekly. - alendronate (FOSAMAX) 70 MG tablet; Take 1 tablet (70 mg total) by mouth once a week. Take with a full glass of water on an empty stomach.  Dispense: 12 tablet; Refill: 1  7. Seasonal allergic rhinitis due to pollen Chronic.  Controlled.  Stable.  Uncomplicated and controlled with OTC antihistamines and Singulair 10 mg once a day. - montelukast (SINGULAIR) 10 MG tablet; Take 1 tablet (10 mg total) by mouth at bedtime.  Dispense: 90 tablet; Refill: 1  8. Mild episode of recurrent major depressive disorder (HCC) Chronic.  Controlled.  Mild.  Patient is no longer followed by psychiatry and has suggested that she is doing well enough that her PCP can continue her medication.  Currently controlled on medication.  Patient will continue trazodone 100 mg nightly. - traZODone (DESYREL) 100 MG tablet; Take 1 tablet (100 mg total) by mouth daily.   Dispense: 90 tablet; Refill: 1  9. Aortic atherosclerosis ([place]) As noted on CT scan.  Approach will be to optimize control of lipids, blood pressure, and weight.  Unfortunately patient will continue to smoke likely but has been encouraged to keep it at a minimum.  10. MS (multiple sclerosis) ([place]) Followed by Dr. Brigitte Pulse [facility].  Currently this is stable and patient is doing well on current regimen per neurology.

## 2019-12-09 DIAGNOSIS — E782 Mixed hyperlipidemia: Secondary | ICD-10-CM | POA: Diagnosis not present

## 2019-12-09 DIAGNOSIS — E039 Hypothyroidism, unspecified: Secondary | ICD-10-CM | POA: Diagnosis not present

## 2019-12-10 LAB — LIPID PANEL WITH LDL/HDL RATIO
Cholesterol, Total: 223 mg/dL — ABNORMAL HIGH (ref 100–199)
HDL: 38 mg/dL — ABNORMAL LOW (ref >39)
LDL Chol Calc (NIH): 159 mg/dL — ABNORMAL HIGH (ref 0–99)
LDL/HDL Ratio: 4.2 ratio — ABNORMAL HIGH (ref 0.0–3.2)
Triglycerides: 142 mg/dL (ref 0–149)
VLDL Cholesterol Cal: 26 mg/dL (ref 5–40)

## 2019-12-10 LAB — THYROID PANEL WITH TSH
Free Thyroxine Index: 2.2 (ref 1.2–4.9)
T3 Uptake Ratio: 29 % (ref 24–39)
T4, Total: 7.7 ug/dL (ref 4.5–12.0)
TSH: 12.4 u[IU]/mL — ABNORMAL HIGH (ref 0.450–4.500)

## 2019-12-13 ENCOUNTER — Other Ambulatory Visit: Payer: Self-pay

## 2019-12-13 DIAGNOSIS — E039 Hypothyroidism, unspecified: Secondary | ICD-10-CM

## 2019-12-13 MED ORDER — LEVOTHYROXINE SODIUM 100 MCG PO TABS: 100.0000 ug | ORAL_TABLET | Freq: Every day | ORAL | 1 refills | Status: DC

## 2019-12-13 NOTE — Progress Notes (Signed)
Sent in levo 100 to [facility] [address]

## 2019-12-15 ENCOUNTER — Other Ambulatory Visit: Payer: Self-pay | Admitting: Family Medicine

## 2019-12-15 DIAGNOSIS — M8000XS Age-related osteoporosis with current pathological fracture, unspecified site, sequela: Secondary | ICD-10-CM

## 2019-12-19 DIAGNOSIS — H353211 Exudative age-related macular degeneration, right eye, with active choroidal neovascularization: Secondary | ICD-10-CM | POA: Diagnosis not present

## 2019-12-19 DIAGNOSIS — H353221 Exudative age-related macular degeneration, left eye, with active choroidal neovascularization: Secondary | ICD-10-CM | POA: Diagnosis not present

## 2020-01-10 ENCOUNTER — Other Ambulatory Visit: Payer: Self-pay

## 2020-01-10 ENCOUNTER — Telehealth: Payer: Self-pay | Admitting: Family Medicine

## 2020-01-10 DIAGNOSIS — F330 Major depressive disorder, recurrent, mild: Secondary | ICD-10-CM

## 2020-01-10 MED ORDER — TRAZODONE HCL 100 MG PO TABS: 100.0000 mg | ORAL_TABLET | Freq: Every day | ORAL | 0 refills | Status: DC

## 2020-01-10 NOTE — Telephone Encounter (Signed)
Transmission to pharmacy failed on the first attempt to send on 7/21. Please resend medication to pharmacy.

## 2020-01-10 NOTE — Telephone Encounter (Signed)
Sent in trazodone

## 2020-01-10 NOTE — Progress Notes (Unsigned)
Sent in trazodone

## 2020-02-03 ENCOUNTER — Telehealth: Payer: Self-pay | Admitting: Family Medicine

## 2020-02-03 NOTE — Telephone Encounter (Signed)
Copied from [place] [id number]. Topic: General - Other >> Feb 03, 2020  8:59 AM Brooke Juarez wrote: Reason for CRM: Pt stated she was advised to call when her medication was low to come in and have her blood rechecked/ Pt asked for Baxter Flattery to call her

## 2020-02-06 ENCOUNTER — Other Ambulatory Visit (INDEPENDENT_AMBULATORY_CARE_PROVIDER_SITE_OTHER): Payer: Medicare Other

## 2020-02-06 DIAGNOSIS — E039 Hypothyroidism, unspecified: Secondary | ICD-10-CM

## 2020-02-06 DIAGNOSIS — Z23 Encounter for immunization: Secondary | ICD-10-CM

## 2020-02-07 DIAGNOSIS — H353211 Exudative age-related macular degeneration, right eye, with active choroidal neovascularization: Secondary | ICD-10-CM | POA: Diagnosis not present

## 2020-02-07 DIAGNOSIS — H353221 Exudative age-related macular degeneration, left eye, with active choroidal neovascularization: Secondary | ICD-10-CM | POA: Diagnosis not present

## 2020-02-07 LAB — TSH: TSH: 2.14 u[IU]/mL (ref 0.450–4.500)

## 2020-02-10 ENCOUNTER — Telehealth: Payer: Self-pay | Admitting: Family Medicine

## 2020-02-10 ENCOUNTER — Other Ambulatory Visit: Payer: Self-pay

## 2020-02-10 DIAGNOSIS — E039 Hypothyroidism, unspecified: Secondary | ICD-10-CM

## 2020-02-10 MED ORDER — LEVOTHYROXINE SODIUM 100 MCG PO TABS: 100.0000 ug | ORAL_TABLET | Freq: Every day | ORAL | 3 refills | Status: DC

## 2020-02-10 NOTE — Telephone Encounter (Signed)
Pt last seen dr Ronnald Ramp in July 2021. Pt needs a refill on levothyroxine 100 mcg sent to [facility] [address]. Pt need appt with dr Ronnald Ramp is 06-08-2020

## 2020-02-10 NOTE — Telephone Encounter (Signed)
Sent in Rx to [facility] to give enough until her appt in January.   CM

## 2020-02-13 ENCOUNTER — Telehealth: Payer: Self-pay | Admitting: Family Medicine

## 2020-02-13 NOTE — Telephone Encounter (Signed)
Robin from [facility] called in needing to change manufacturing on medication levothyroxine (SYNTHROID) 100 MCG tablet [[id number]]   please advise

## 2020-02-28 ENCOUNTER — Other Ambulatory Visit: Payer: Self-pay | Admitting: Family Medicine

## 2020-02-28 DIAGNOSIS — E876 Hypokalemia: Secondary | ICD-10-CM

## 2020-02-29 DIAGNOSIS — Z23 Encounter for immunization: Secondary | ICD-10-CM | POA: Diagnosis not present

## 2020-03-07 ENCOUNTER — Other Ambulatory Visit: Payer: Self-pay

## 2020-03-07 DIAGNOSIS — E876 Hypokalemia: Secondary | ICD-10-CM

## 2020-03-07 MED ORDER — POTASSIUM CHLORIDE ER 10 MEQ PO TBCR: 10.0000 meq | EXTENDED_RELEASE_TABLET | Freq: Every day | ORAL | 1 refills | Status: DC

## 2020-03-07 NOTE — Progress Notes (Unsigned)
Sent in potassium to [facility]

## 2020-03-19 DIAGNOSIS — R404 Transient alteration of awareness: Secondary | ICD-10-CM | POA: Diagnosis not present

## 2020-03-19 DIAGNOSIS — G2581 Restless legs syndrome: Secondary | ICD-10-CM | POA: Diagnosis not present

## 2020-04-03 DIAGNOSIS — H353221 Exudative age-related macular degeneration, left eye, with active choroidal neovascularization: Secondary | ICD-10-CM | POA: Diagnosis not present

## 2020-04-03 DIAGNOSIS — H353211 Exudative age-related macular degeneration, right eye, with active choroidal neovascularization: Secondary | ICD-10-CM | POA: Diagnosis not present

## 2020-04-15 ENCOUNTER — Other Ambulatory Visit: Payer: Self-pay | Admitting: Family Medicine

## 2020-04-15 DIAGNOSIS — F330 Major depressive disorder, recurrent, mild: Secondary | ICD-10-CM

## 2020-04-15 DIAGNOSIS — J301 Allergic rhinitis due to pollen: Secondary | ICD-10-CM

## 2020-04-15 NOTE — Telephone Encounter (Signed)
Requested Prescriptions  Pending Prescriptions Disp Refills  . traZODone (DESYREL) 100 MG tablet [Pharmacy Med Name: traZODone HCl 100 MG Oral Tablet] 90 tablet 0    Sig: Take 1 tablet by mouth once daily     Psychiatry: Antidepressants - Serotonin Modulator Passed - 04/15/2020  6:31 AM      Passed - Completed PHQ-2 or PHQ-9 in the last 360 days      Passed - Valid encounter within last 6 months    Recent Outpatient Visits          4 months ago Adult hypothyroidism   [facility] Juline Patch, MD   9 months ago Acute maxillary sinusitis, recurrence not specified   [facility] Juline Patch, MD   10 months ago Adult hypothyroidism   [facility] Juline Patch, MD   1 year ago Taking medication for chronic disease   [facility] Juline Patch, MD   1 year ago Acute pansinusitis, recurrence not specified   [facility] Juline Patch, MD      Future Appointments            In 1 month Juline Patch, MD [facility]           . montelukast (SINGULAIR) 10 MG tablet [Pharmacy Med Name: Montelukast Sodium 10 MG Oral Tablet] 90 tablet     Sig: TAKE 1 TABLET BY MOUTH AT BEDTIME     Pulmonology:  Leukotriene Inhibitors Passed - 04/15/2020  6:31 AM      Passed - Valid encounter within last 12 months    Recent Outpatient Visits          4 months ago Adult hypothyroidism   [facility] Juline Patch, MD   9 months ago Acute maxillary sinusitis, recurrence not specified   [facility] Juline Patch, MD   10 months ago Adult hypothyroidism   [facility] Juline Patch, MD   1 year ago Taking medication for chronic disease   [facility] Juline Patch, MD   1 year ago Acute pansinusitis, recurrence not specified   [facility] Juline Patch, MD      Future Appointments            In 1 month Juline Patch, MD [facility]

## 2020-06-07 DIAGNOSIS — Z800 Family history of malignant neoplasm of digestive organs: Secondary | ICD-10-CM | POA: Diagnosis not present

## 2020-06-07 DIAGNOSIS — G35 Multiple sclerosis: Secondary | ICD-10-CM | POA: Diagnosis not present

## 2020-06-07 DIAGNOSIS — K52831 Collagenous colitis: Secondary | ICD-10-CM | POA: Diagnosis not present

## 2020-06-08 ENCOUNTER — Ambulatory Visit: Payer: Medicare Other | Admitting: Family Medicine

## 2020-06-10 ENCOUNTER — Other Ambulatory Visit: Payer: Self-pay | Admitting: Family Medicine

## 2020-06-10 DIAGNOSIS — M8000XS Age-related osteoporosis with current pathological fracture, unspecified site, sequela: Secondary | ICD-10-CM

## 2020-06-12 DIAGNOSIS — H353221 Exudative age-related macular degeneration, left eye, with active choroidal neovascularization: Secondary | ICD-10-CM | POA: Diagnosis not present

## 2020-06-12 DIAGNOSIS — H353231 Exudative age-related macular degeneration, bilateral, with active choroidal neovascularization: Secondary | ICD-10-CM | POA: Diagnosis not present

## 2020-06-12 DIAGNOSIS — H353211 Exudative age-related macular degeneration, right eye, with active choroidal neovascularization: Secondary | ICD-10-CM | POA: Diagnosis not present

## 2020-06-15 ENCOUNTER — Other Ambulatory Visit: Payer: Self-pay

## 2020-06-15 ENCOUNTER — Ambulatory Visit (INDEPENDENT_AMBULATORY_CARE_PROVIDER_SITE_OTHER): Payer: Medicare HMO | Admitting: Family Medicine

## 2020-06-15 ENCOUNTER — Encounter: Payer: Self-pay | Admitting: Family Medicine

## 2020-06-15 VITALS — BP 128/70 | HR 60 | Ht 63.0 in | Wt 145.0 lb

## 2020-06-15 DIAGNOSIS — E876 Hypokalemia: Secondary | ICD-10-CM

## 2020-06-15 DIAGNOSIS — M8000XS Age-related osteoporosis with current pathological fracture, unspecified site, sequela: Secondary | ICD-10-CM | POA: Diagnosis not present

## 2020-06-15 DIAGNOSIS — K219 Gastro-esophageal reflux disease without esophagitis: Secondary | ICD-10-CM | POA: Diagnosis not present

## 2020-06-15 DIAGNOSIS — F330 Major depressive disorder, recurrent, mild: Secondary | ICD-10-CM | POA: Diagnosis not present

## 2020-06-15 DIAGNOSIS — E782 Mixed hyperlipidemia: Secondary | ICD-10-CM | POA: Diagnosis not present

## 2020-06-15 DIAGNOSIS — E039 Hypothyroidism, unspecified: Secondary | ICD-10-CM

## 2020-06-15 DIAGNOSIS — E7849 Other hyperlipidemia: Secondary | ICD-10-CM

## 2020-06-15 DIAGNOSIS — J301 Allergic rhinitis due to pollen: Secondary | ICD-10-CM | POA: Diagnosis not present

## 2020-06-15 MED ORDER — PANTOPRAZOLE SODIUM 40 MG PO TBEC: 40.0000 mg | DELAYED_RELEASE_TABLET | Freq: Every day | ORAL | 1 refills | Status: DC

## 2020-06-15 MED ORDER — MONTELUKAST SODIUM 10 MG PO TABS: 10.0000 mg | ORAL_TABLET | Freq: Every day | ORAL | 1 refills | Status: DC

## 2020-06-15 MED ORDER — POTASSIUM CHLORIDE ER 10 MEQ PO TBCR: 10.0000 meq | EXTENDED_RELEASE_TABLET | Freq: Every day | ORAL | 1 refills | Status: DC

## 2020-06-15 MED ORDER — TRAZODONE HCL 100 MG PO TABS: 100.0000 mg | ORAL_TABLET | Freq: Every day | ORAL | 1 refills | Status: DC

## 2020-06-15 MED ORDER — ALENDRONATE SODIUM 70 MG PO TABS: ORAL_TABLET | ORAL | 1 refills | Status: DC

## 2020-06-15 MED ORDER — LEVOTHYROXINE SODIUM 100 MCG PO TABS: 100.0000 ug | ORAL_TABLET | Freq: Every day | ORAL | 1 refills | Status: DC

## 2020-06-15 NOTE — Progress Notes (Signed)
Date:  06/15/2020   Name:  Brooke Juarez   DOB:  [birth year]   MRN:  [id number]   Chief Complaint: Hypothyroidism, Osteoporosis, Allergic Rhinitis , Gastroesophageal Reflux, Insomnia, and hypokalemia  Gastroesophageal Reflux She reports no abdominal pain, no belching, no chest pain, no choking, no coughing, no dysphagia, no early satiety, no globus sensation, no heartburn, no hoarse voice, no nausea, no sore throat, no stridor, no tooth decay, no water brash or no wheezing. This is a chronic problem. The current episode started more than 1 year ago. The problem has been gradually improving. The symptoms are aggravated by certain foods. Pertinent negatives include no anemia, fatigue, melena, muscle weakness, orthopnea or weight loss. There are no known risk factors. She has tried a PPI for the symptoms. The treatment provided moderate relief.  Insomnia Primary symptoms: no fragmented sleep, no sleep disturbance, no difficulty falling asleep, no somnolence, no frequent awakening, no premature morning awakening, no malaise/fatigue, no napping.   The current episode started more than one month. The problem has been waxing and waning since onset. The treatment provided moderate relief. PMH includes: associated symptoms present, no hypertension, no depression, no family stress or anxiety, no restless leg syndrome, no work related stressors, no chronic pain, no apnea.   Thyroid Problem Presents for follow-up visit. Patient reports no anxiety, cold intolerance, constipation, depressed mood, diaphoresis, diarrhea, dry skin, fatigue, hair loss, heat intolerance, hoarse voice, leg swelling, menstrual problem, nail problem, palpitations, tremors, weight gain or weight loss. The symptoms have been stable.    Lab Results  Component Value Date   CREATININE 0.82 06/07/2019   BUN 6 (L) 06/07/2019   NA 143 06/07/2019   K 4.3 06/07/2019   CL 105 06/07/2019   CO2 23 06/07/2019   Lab Results  Component Value  Date   CHOL 223 (H) 12/09/2019   HDL 38 (L) 12/09/2019   LDLCALC 159 (H) 12/09/2019   TRIG 142 12/09/2019   CHOLHDL 3.6 06/16/2017   Lab Results  Component Value Date   TSH 2.140 02/06/2020   No results found for: HGBA1C Lab Results  Component Value Date   WBC 10.0 04/04/2018   HGB 12.5 04/04/2018   HCT 39.7 04/04/2018   MCV 102.6 (H) 04/04/2018   PLT 152 04/04/2018   Lab Results  Component Value Date   ALT 8 06/07/2019   AST 17 06/07/2019   ALKPHOS 164 (H) 06/07/2019   BILITOT 0.8 06/07/2019     Review of Systems  Constitutional: Negative.  Negative for chills, diaphoresis, fatigue, fever, malaise/fatigue, unexpected weight change, weight gain and weight loss.  HENT: Negative for congestion, ear discharge, ear pain, hoarse voice, rhinorrhea, sinus pressure, sneezing and sore throat.   Eyes: Negative for double vision, photophobia, pain, discharge, redness and itching.  Respiratory: Negative for apnea, cough, choking, shortness of breath, wheezing and stridor.   Cardiovascular: Negative for chest pain and palpitations.  Gastrointestinal: Negative for abdominal pain, blood in stool, constipation, diarrhea, dysphagia, heartburn, melena, nausea and vomiting.  Endocrine: Negative for cold intolerance, heat intolerance, polydipsia, polyphagia and polyuria.  Genitourinary: Negative for dysuria, flank pain, frequency, hematuria, menstrual problem, pelvic pain, urgency, vaginal bleeding and vaginal discharge.  Musculoskeletal: Negative for arthralgias, back pain, myalgias and muscle weakness.  Skin: Negative for rash.  Allergic/Immunologic: Negative for environmental allergies and food allergies.  Neurological: Negative for dizziness, tremors, weakness, light-headedness, numbness and headaches.  Hematological: Negative for adenopathy. Does not bruise/bleed easily.  Psychiatric/Behavioral: Negative for depression,  dysphoric mood and sleep disturbance. The patient has insomnia. The  patient is not nervous/anxious.     Patient Active Problem List   Diagnosis Date Noted  . Mixed hyperlipidemia 06/07/2019  . Irreducible right femoral hernia 04/28/2018  . Limited mobility with chronic pain & MS 04/05/2018  . Tobacco abuse 04/05/2018  . MS (multiple sclerosis) ([place])   . Hypokalemia 11/25/2017  . Seasonal allergic rhinitis due to pollen 12/08/2016  . CAD (coronary artery disease) 11/27/2014  . Medicare annual wellness visit, subsequent 11/27/2014  . Vitamin D deficiency 11/22/2014  . Familial multiple lipoprotein-type hyperlipidemia 09/29/2014  . Clinical depression 09/29/2014  . Acid reflux 09/29/2014  . Routine general medical examination at a health care facility 09/29/2014  . Restless leg 09/29/2014  . DS (disseminated sclerosis) ([place]) 09/29/2014  . Nicotine addiction 09/29/2014  . OP (osteoporosis) 09/29/2014  . Adult hypothyroidism 10/05/2013  . Collagenous colitis 10/05/2013  . Hypothyroid 10/05/2013    Allergies  Allergen Reactions  . Codeine Nausea And Vomiting    Past Surgical History:  Procedure Laterality Date  . APPENDECTOMY Left 04/14/2018   Procedure: APPENDECTOMY;  Surgeon: Olean Ree, MD;  Location: [facility] ORS;  Service: General;  Laterality: Left;  . CATARACT EXTRACTION W/PHACO Right 10/30/2014   Procedure: CATARACT EXTRACTION PHACO AND INTRAOCULAR LENS PLACEMENT ([place]);  Surgeon: Estill Cotta, MD;  Location: [facility] ORS;  Service: Ophthalmology;  Laterality: Right;  [place]  1:55.7   . CATARACT EXTRACTION W/PHACO Left 06/18/2015   Procedure: CATARACT EXTRACTION PHACO AND INTRAOCULAR LENS PLACEMENT ([facility]);  Surgeon: Estill Cotta, MD;  Location: [facility] ORS;  Service: Ophthalmology;  Laterality: Left;  [place] 01:36 AP% 26.1 CDE 42.19 fluid pack lo0t # [id number] H  . COLONOSCOPY  2014   normal- Rein- repeat in 5 years  . EYE SURGERY    . FOOT SURGERY    . INGUINAL HERNIA REPAIR Right 04/14/2018   Procedure: HERNIA REPAIR INGUINAL;  Surgeon:  Olean Ree, MD;  Location: [facility] ORS;  Service: General;  Laterality: Right;  . NASAL SINUS SURGERY    . TONSILLECTOMY    . TUBAL LIGATION    . VAGINAL HYSTERECTOMY      Social History   Tobacco Use  . Smoking status: Current Every Day Smoker    Packs/day: 0.75    Years: 55.00    Pack years: 41.25    Types: Cigarettes  . Smokeless tobacco: Never Used  Vaping Use  . Vaping Use: Never used  Substance Use Topics  . Alcohol use: No    Alcohol/week: 0.0 standard drinks  . Drug use: No     Medication list has been reviewed and updated.  Current Meds  Medication Sig  . acetaminophen (TYLENOL) 500 MG tablet Take 1,000 mg by mouth 2 (two) times daily as needed for moderate pain or headache.  . alendronate (FOSAMAX) 70 MG tablet TAKE 1 TABLET BY MOUTH ONCE A WEEK ON AN EMPTY STOMACH WITH  A  FULL  GLASS  OF  WATER  . cholecalciferol (VITAMIN D3) 10 MCG (400 UNIT) TABS tablet Take 400 Units by mouth.  . gabapentin (NEURONTIN) 300 MG capsule Take 1 capsule by mouth 4 (four) times daily. [name]  . levothyroxine (SYNTHROID) 100 MCG tablet Take 1 tablet (100 mcg total) by mouth daily.  . montelukast (SINGULAIR) 10 MG tablet Take 1 tablet (10 mg total) by mouth at bedtime.  . Multiple Vitamins-Minerals (PRESERVISION AREDS 2 PO) Take 2 capsules by mouth daily.  . Omega-3 1000 MG CAPS Take  1,000 mg by mouth daily.  . pantoprazole (PROTONIX) 40 MG tablet Take 1 tablet (40 mg total) by mouth daily.  . potassium chloride (KLOR-CON) 10 MEQ tablet Take 1 tablet (10 mEq total) by mouth daily. Pt taking 1 tab 3 days per week only  . pramipexole (MIRAPEX) 0.25 MG tablet Take by mouth. neuro  . rOPINIRole (REQUIP) 1 MG tablet Take 1.5 tablets by mouth every evening. neuro  . sulfaSALAzine (AZULFIDINE) 500 MG EC tablet Take 1 tablet by mouth 2 (two) times daily before a meal.  . traZODone (DESYREL) 100 MG tablet Take 1 tablet by mouth once daily  . vitamin B-12 (CYANOCOBALAMIN) 500 MCG tablet Take 500  mcg by mouth every 14 (fourteen) days.     PHQ 2/9 Scores 06/15/2020 12/07/2019 11/07/2019 12/01/2018  PHQ - 2 Score 0 0 0 0  PHQ- 9 Score 0 0 - 4    GAD 7 : Generalized Anxiety Score 06/15/2020 12/07/2019  Nervous, Anxious, on Edge 0 0  Control/stop worrying 0 0  Worry too much - different things 0 0  Trouble relaxing 0 0  Restless 0 0  Easily annoyed or irritable 0 0  Afraid - awful might happen 0 0  Total GAD 7 Score 0 0    BP Readings from Last 3 Encounters:  06/15/20 128/70  12/07/19 112/70  06/07/19 130/70    Physical Exam Vitals and nursing note reviewed.  Constitutional:      Appearance: She is well-developed and well-nourished.  HENT:     Head: Normocephalic.     Right Ear: Tympanic membrane, ear canal and external ear normal.     Left Ear: Tympanic membrane, ear canal and external ear normal.     Nose: Nose normal. No congestion or rhinorrhea.     Mouth/Throat:     Mouth: Oropharynx is clear and moist. Mucous membranes are moist.  Eyes:     General: Lids are everted, no foreign bodies appreciated. No scleral icterus.       Left eye: No foreign body or hordeolum.     Extraocular Movements: EOM normal.     Conjunctiva/sclera: Conjunctivae normal.     Right eye: Right conjunctiva is not injected.     Left eye: Left conjunctiva is not injected.     Pupils: Pupils are equal, round, and reactive to light.  Neck:     Thyroid: No thyromegaly.     Vascular: No JVD.     Trachea: No tracheal deviation.  Cardiovascular:     Rate and Rhythm: Normal rate and regular rhythm.     Pulses: Intact distal pulses.     Heart sounds: Normal heart sounds. No murmur heard. No friction rub. No gallop.   Pulmonary:     Effort: Pulmonary effort is normal. No respiratory distress.     Breath sounds: Normal breath sounds. No wheezing, rhonchi or rales.  Abdominal:     General: Bowel sounds are normal.     Palpations: Abdomen is soft. There is no hepatosplenomegaly or mass.      Tenderness: There is no abdominal tenderness. There is no guarding or rebound.  Musculoskeletal:        General: No swelling, tenderness or edema. Normal range of motion.     Cervical back: Normal range of motion and neck supple.  Lymphadenopathy:     Cervical: No cervical adenopathy.  Skin:    General: Skin is warm.     Findings: No bruising, erythema or rash.  Neurological:  Mental Status: She is alert and oriented to person, place, and time.     Cranial Nerves: No cranial nerve deficit.     Deep Tendon Reflexes: Strength normal. Reflexes normal.  Psychiatric:        Mood and Affect: Mood and affect normal. Mood is not anxious or depressed.     Wt Readings from Last 3 Encounters:  06/15/20 145 lb (65.8 kg)  12/07/19 149 lb (67.6 kg)  06/07/19 147 lb (66.7 kg)    BP 128/70   Pulse 60   Ht 5' 3"  (1.6 m)   Wt 145 lb (65.8 kg)   LMP  (LMP Unknown) Comment: had hysterectomy  BMI 25.69 kg/m   Assessment and Plan:  1. Adult hypothyroidism Chronic.  Controlled.  Stable.  Continue levothyroxine 100 mcg on condition that the thyroid function panel is normal. - levothyroxine (SYNTHROID) 100 MCG tablet; Take 1 tablet (100 mcg total) by mouth daily.  Dispense: 90 tablet; Refill: 1 - Thyroid Panel With TSH  2. Gastroesophageal reflux disease, unspecified whether esophagitis present Chronic.  Controlled.  Stable.  Continue pantoprazole 40 mg once a day. - pantoprazole (PROTONIX) 40 MG tablet; Take 1 tablet (40 mg total) by mouth daily.  Dispense: 90 tablet; Refill: 1  3. Age-related osteoporosis with current pathological fracture, sequela Chronic.  Controlled.  Stable.  Continue Fosamax 70 mg once a week. - alendronate (FOSAMAX) 70 MG tablet; TAKE 1 TABLET BY MOUTH ONCE A WEEK ON AN EMPTY STOMACH WITH  A  FULL  GLASS  OF  WATER  Dispense: 12 tablet; Refill: 1  4. Seasonal allergic rhinitis due to pollen Chronic.  Controlled.  Stable.  Continue Singulair 10 mg once a day. -  montelukast (SINGULAIR) 10 MG tablet; Take 1 tablet (10 mg total) by mouth at bedtime.  Dispense: 90 tablet; Refill: 1  5. Familial multiple lipoprotein-type hyperlipidemia Chronic.  Controlled.  Stable.  Will check lipid panel currently stable on diet. - Lipid Panel With LDL/HDL Ratio  6. Hypokalemia Chronic.  Controlled.  Stable.  Will check renal function panel for current electrolyte status and continue supplementation with 10 mEq daily. - potassium chloride (KLOR-CON) 10 MEQ tablet; Take 1 tablet (10 mEq total) by mouth daily. Pt taking 1 tab 3 days per week only  Dispense: 36 tablet; Refill: 1 - Comprehensive Metabolic Panel (CMET)  7. Mild episode of recurrent major depressive disorder (HCC) Chronic.  Controlled.  Stable.  PHQ is 0.  Patient is stable on current regimen of no antidepressant at this time and does not desire to restart any medications. - traZODone (DESYREL) 100 MG tablet; Take 1 tablet (100 mg total) by mouth daily.  Dispense: 90 tablet; Refill: 1

## 2020-06-16 LAB — LIPID PANEL WITH LDL/HDL RATIO
Cholesterol, Total: 215 mg/dL — ABNORMAL HIGH (ref 100–199)
HDL: 44 mg/dL (ref >39)
LDL Chol Calc (NIH): 149 mg/dL — ABNORMAL HIGH (ref 0–99)
LDL/HDL Ratio: 3.4 ratio — ABNORMAL HIGH (ref 0.0–3.2)
Triglycerides: 123 mg/dL (ref 0–149)
VLDL Cholesterol Cal: 22 mg/dL (ref 5–40)

## 2020-06-16 LAB — COMPREHENSIVE METABOLIC PANEL
ALT: 9 IU/L (ref 0–32)
AST: 16 IU/L (ref 0–40)
Albumin/Globulin Ratio: 2.5 — ABNORMAL HIGH (ref 1.2–2.2)
Albumin: 4.7 g/dL (ref 3.7–4.7)
Alkaline Phosphatase: 167 IU/L — ABNORMAL HIGH (ref 44–121)
BUN/Creatinine Ratio: 6 — ABNORMAL LOW (ref 12–28)
BUN: 5 mg/dL — ABNORMAL LOW (ref 8–27)
Bilirubin Total: 0.6 mg/dL (ref 0.0–1.2)
CO2: 24 mmol/L (ref 20–29)
Calcium: 9.8 mg/dL (ref 8.7–10.3)
Chloride: 100 mmol/L (ref 96–106)
Creatinine, Ser: 0.87 mg/dL (ref 0.57–1.00)
GFR calc Af Amer: 77 mL/min/{1.73_m2} (ref >59)
GFR calc non Af Amer: 67 mL/min/{1.73_m2} (ref >59)
Globulin, Total: 1.9 g/dL (ref 1.5–4.5)
Glucose: 95 mg/dL (ref 65–99)
Potassium: 4.4 mmol/L (ref 3.5–5.2)
Sodium: 141 mmol/L (ref 134–144)
Total Protein: 6.6 g/dL (ref 6.0–8.5)

## 2020-06-16 LAB — THYROID PANEL WITH TSH
Free Thyroxine Index: 3 (ref 1.2–4.9)
T3 Uptake Ratio: 30 % (ref 24–39)
T4, Total: 10 ug/dL (ref 4.5–12.0)
TSH: 5.52 u[IU]/mL — ABNORMAL HIGH (ref 0.450–4.500)

## 2020-06-18 ENCOUNTER — Other Ambulatory Visit: Payer: Self-pay

## 2020-06-18 DIAGNOSIS — E039 Hypothyroidism, unspecified: Secondary | ICD-10-CM

## 2020-06-18 MED ORDER — LEVOTHYROXINE SODIUM 112 MCG PO TABS: 112.0000 ug | ORAL_TABLET | Freq: Every day | ORAL | 1 refills | Status: DC

## 2020-07-05 ENCOUNTER — Other Ambulatory Visit: Payer: Self-pay

## 2020-07-05 ENCOUNTER — Other Ambulatory Visit
Admission: RE | Admit: 2020-07-05 | Discharge: 2020-07-05 | Disposition: A | Discharge status: Home / Self Care | Payer: Medicare HMO | Source: Ambulatory Visit | Attending: Internal Medicine | Admitting: Internal Medicine

## 2020-07-05 DIAGNOSIS — Z01812 Encounter for preprocedural laboratory examination: Secondary | ICD-10-CM | POA: Diagnosis not present

## 2020-07-05 DIAGNOSIS — Z20822 Contact with and (suspected) exposure to covid-19: Secondary | ICD-10-CM | POA: Insufficient documentation

## 2020-07-05 LAB — SARS CORONAVIRUS 2 (TAT 6-24 HRS): SARS Coronavirus 2: NEGATIVE

## 2020-07-06 ENCOUNTER — Encounter: Payer: Self-pay | Admitting: Internal Medicine

## 2020-07-09 ENCOUNTER — Ambulatory Visit: Payer: Medicare HMO | Admitting: Anesthesiology

## 2020-07-09 ENCOUNTER — Ambulatory Visit
Admission: RE | Admit: 2020-07-09 | Discharge: 2020-07-09 | Disposition: A | Discharge status: Home / Self Care | Payer: Medicare HMO | Attending: Internal Medicine | Admitting: Internal Medicine

## 2020-07-09 ENCOUNTER — Encounter: Payer: Self-pay | Admitting: Internal Medicine

## 2020-07-09 ENCOUNTER — Encounter
Admission: RE | Disposition: A | Discharge status: Home / Self Care | Payer: Self-pay | Source: Home / Self Care | Attending: Internal Medicine

## 2020-07-09 DIAGNOSIS — Z885 Allergy status to narcotic agent status: Secondary | ICD-10-CM | POA: Diagnosis not present

## 2020-07-09 DIAGNOSIS — K52831 Collagenous colitis: Secondary | ICD-10-CM | POA: Insufficient documentation

## 2020-07-09 DIAGNOSIS — Z79899 Other long term (current) drug therapy: Secondary | ICD-10-CM | POA: Insufficient documentation

## 2020-07-09 DIAGNOSIS — Z800 Family history of malignant neoplasm of digestive organs: Secondary | ICD-10-CM | POA: Insufficient documentation

## 2020-07-09 DIAGNOSIS — Z8719 Personal history of other diseases of the digestive system: Secondary | ICD-10-CM | POA: Diagnosis not present

## 2020-07-09 DIAGNOSIS — K640 First degree hemorrhoids: Secondary | ICD-10-CM | POA: Diagnosis not present

## 2020-07-09 DIAGNOSIS — K648 Other hemorrhoids: Secondary | ICD-10-CM | POA: Diagnosis not present

## 2020-07-09 DIAGNOSIS — Z79890 Hormone replacement therapy (postmenopausal): Secondary | ICD-10-CM | POA: Insufficient documentation

## 2020-07-09 DIAGNOSIS — K621 Rectal polyp: Secondary | ICD-10-CM | POA: Insufficient documentation

## 2020-07-09 DIAGNOSIS — F17200 Nicotine dependence, unspecified, uncomplicated: Secondary | ICD-10-CM | POA: Insufficient documentation

## 2020-07-09 DIAGNOSIS — Z1211 Encounter for screening for malignant neoplasm of colon: Secondary | ICD-10-CM | POA: Diagnosis not present

## 2020-07-09 DIAGNOSIS — K635 Polyp of colon: Secondary | ICD-10-CM | POA: Diagnosis not present

## 2020-07-09 DIAGNOSIS — E782 Mixed hyperlipidemia: Secondary | ICD-10-CM | POA: Diagnosis not present

## 2020-07-09 DIAGNOSIS — K5520 Angiodysplasia of colon without hemorrhage: Secondary | ICD-10-CM | POA: Diagnosis not present

## 2020-07-09 DIAGNOSIS — Q2733 Arteriovenous malformation of digestive system vessel: Secondary | ICD-10-CM | POA: Diagnosis not present

## 2020-07-09 DIAGNOSIS — J449 Chronic obstructive pulmonary disease, unspecified: Secondary | ICD-10-CM | POA: Diagnosis not present

## 2020-07-09 HISTORY — PX: COLONOSCOPY WITH PROPOFOL: SHX5780

## 2020-07-09 HISTORY — DX: Unspecified osteoarthritis, unspecified site: M19.90

## 2020-07-09 HISTORY — DX: Headache, unspecified: R51.9

## 2020-07-09 SURGERY — COLONOSCOPY WITH PROPOFOL
Anesthesia: General

## 2020-07-09 MED ORDER — SODIUM CHLORIDE 0.9 % IV SOLN
INTRAVENOUS | Status: DC
  Administered 2020-07-09: 20 mL/h via INTRAVENOUS

## 2020-07-09 MED ORDER — PROPOFOL 500 MG/50ML IV EMUL
INTRAVENOUS | Status: AC
  Filled 2020-07-09: qty 50

## 2020-07-09 MED ORDER — PROPOFOL 10 MG/ML IV BOLUS
INTRAVENOUS | Status: DC | PRN
  Administered 2020-07-09: 80 mg via INTRAVENOUS
  Administered 2020-07-09: 20 mg via INTRAVENOUS

## 2020-07-09 MED ORDER — LIDOCAINE HCL (PF) 2 % IJ SOLN
INTRAMUSCULAR | Status: AC
  Filled 2020-07-09: qty 5

## 2020-07-09 MED ORDER — PROPOFOL 500 MG/50ML IV EMUL
INTRAVENOUS | Status: DC | PRN
  Administered 2020-07-09: 120 ug/kg/min via INTRAVENOUS

## 2020-07-09 MED ORDER — LIDOCAINE HCL (CARDIAC) PF 100 MG/5ML IV SOSY
PREFILLED_SYRINGE | INTRAVENOUS | Status: DC | PRN
  Administered 2020-07-09: 50 mg via INTRAVENOUS

## 2020-07-09 NOTE — Transfer of Care (Signed)
Immediate Anesthesia Transfer of Care Note  Patient: Brooke Juarez  Procedure(s) Performed: COLONOSCOPY WITH PROPOFOL (N/A )  Patient Location: PACU and Endoscopy Unit  Anesthesia Type:General  Level of Consciousness: drowsy and patient cooperative  Airway & Oxygen Therapy: Patient Spontanous Breathing  Post-op Assessment: Report given to RN and Post -op Vital signs reviewed and stable  Post vital signs: Reviewed and stable  Last Vitals:  Vitals Value Taken Time  BP 109/68 07/09/20 1018  Temp 36.4 C 07/09/20 1018  Pulse 86 07/09/20 1019  Resp 22 07/09/20 1019  SpO2 99 % 07/09/20 1019  Vitals shown include unvalidated device data.  Last Pain:  Vitals:   07/09/20 1018  TempSrc: Temporal  PainSc: 0-No pain         Complications: No complications documented.

## 2020-07-09 NOTE — Interval H&P Note (Signed)
History and Physical Interval Note:  07/09/2020 9:49 AM  Brooke Juarez  has presented today for surgery, with the diagnosis of HX.OF COLLAGENOUS COLITIS,FAMILY HX.OF COLON CANCER.  The various methods of treatment have been discussed with the patient and family. After consideration of risks, benefits and other options for treatment, the patient has consented to  Procedure(s): COLONOSCOPY WITH PROPOFOL (N/A) as a surgical intervention.  The patient's history has been reviewed, patient examined, no change in status, stable for surgery.  I have reviewed the patient's chart and labs.  Questions were answered to the patient's satisfaction.     [place], [place]

## 2020-07-09 NOTE — H&P (Signed)
Outpatient short stay form Pre-procedure 07/09/2020 9:48 AM Derriona Branscom K. Alice Reichert, M.D.  Primary Physician: Otilio Miu, M.D.  Reason for visit:  Family history of colon cancer  History of present illness:  70 year old patient presenting for family history of colon cancer. Patient denies any change in bowel habits, rectal bleeding or involuntary weight loss.  Patient has personal history of collagenous colitis but this is controlled with Sulfadine.   Current Facility-Administered Medications:  .  0.9 %  sodium chloride infusion, , Intravenous, Continuous, Slaughters, Benay Pike, MD, Last Rate: 20 mL/hr at 07/09/20 0910, 20 mL/hr at 07/09/20 0910  Medications Prior to Admission  Medication Sig Dispense Refill Last Dose  . acetaminophen (TYLENOL) 500 MG tablet Take 1,000 mg by mouth 2 (two) times daily as needed for moderate pain or headache.   Past Week at Unknown time  . alendronate (FOSAMAX) 70 MG tablet TAKE 1 TABLET BY MOUTH ONCE A WEEK ON AN EMPTY STOMACH WITH  A  FULL  GLASS  OF  WATER 12 tablet 1 Past Week at Unknown time  . cholecalciferol (VITAMIN D3) 10 MCG (400 UNIT) TABS tablet Take 400 Units by mouth.   Past Week at Unknown time  . levothyroxine (SYNTHROID) 112 MCG tablet Take 1 tablet (112 mcg total) by mouth daily. 30 tablet 1 Past Week at Unknown time  . montelukast (SINGULAIR) 10 MG tablet Take 1 tablet (10 mg total) by mouth at bedtime. 90 tablet 1 Past Week at Unknown time  . Multiple Vitamins-Minerals (PRESERVISION AREDS 2 PO) Take 2 capsules by mouth daily.   Past Week at Unknown time  . Omega-3 1000 MG CAPS Take 1,000 mg by mouth daily.   Past Week at Unknown time  . pantoprazole (PROTONIX) 40 MG tablet Take 1 tablet (40 mg total) by mouth daily. 90 tablet 1 Past Week at Unknown time  . potassium chloride (KLOR-CON) 10 MEQ tablet Take 1 tablet (10 mEq total) by mouth daily. Pt taking 1 tab 3 days per week only 36 tablet 1 Past Week at Unknown time  . pramipexole (MIRAPEX)  0.25 MG tablet Take by mouth. neuro   Past Week at Unknown time  . rOPINIRole (REQUIP) 1 MG tablet Take 1.5 tablets by mouth every evening. neuro   Past Week at Unknown time  . sulfaSALAzine (AZULFIDINE) 500 MG EC tablet Take 1 tablet by mouth 2 (two) times daily before a meal.   Past Week at Unknown time  . traZODone (DESYREL) 100 MG tablet Take 1 tablet (100 mg total) by mouth daily. 90 tablet 1 Past Week at Unknown time  . vitamin B-12 (CYANOCOBALAMIN) 500 MCG tablet Take 500 mcg by mouth every 14 (fourteen) days.    Past Week at Unknown time  . gabapentin (NEURONTIN) 300 MG capsule Take 1 capsule by mouth 4 (four) times daily. [name]        Allergies  Allergen Reactions  . Codeine Nausea And Vomiting     Past Medical History:  Diagnosis Date  . Arthritis   . Crohn disease ([place])   . Depression   . GERD (gastroesophageal reflux disease)   . Headache    Migraines  . Heart murmur   . Hyperlipidemia   . Hypothyroidism   . Macular degeneration   . MS (multiple sclerosis) ([place])   . Restless leg   . Seizures ([place])   . Tremors of nervous system     Review of systems:  Otherwise negative.    Physical Exam  Gen: Alert, oriented. Appears stated age.  HEENT: [place]/AT. PERRLA. Lungs: CTA, no wheezes. CV: RR nl S1, S2. Abd: soft, benign, no masses. BS+ Ext: No edema. Pulses 2+    Planned procedures: Proceed with colonoscopy. The patient understands the nature of the planned procedure, indications, risks, alternatives and potential complications including but not limited to bleeding, infection, perforation, damage to internal organs and possible oversedation/side effects from anesthesia. The patient agrees and gives consent to proceed.  Please refer to procedure notes for findings, recommendations and patient disposition/instructions.     Michaelpaul Apo K. Alice Reichert, M.D. Gastroenterology 07/09/2020  9:48 AM

## 2020-07-09 NOTE — Anesthesia Preprocedure Evaluation (Signed)
Anesthesia Evaluation  Patient identified by MRN, date of birth, ID band Patient awake    Reviewed: Allergy & Precautions, H&P , NPO status , Patient's Chart, lab work & pertinent test results  History of Anesthesia Complications Negative for: history of anesthetic complications  Airway Mallampati: III  TM Distance: >3 FB Neck ROM: limited    Dental  (+) Chipped, Poor Dentition, Missing   Pulmonary neg shortness of breath, COPD, Current Smoker and Patient abstained from smoking.,    Pulmonary exam normal        Cardiovascular Exercise Tolerance: Good (-) angina+ CAD  Normal cardiovascular exam     Neuro/Psych  Headaches, Seizures -,  negative psych ROS   GI/Hepatic Neg liver ROS, GERD  Medicated and Controlled,  Endo/Other  Hypothyroidism   Renal/GU negative Renal ROS  negative genitourinary   Musculoskeletal  (+) Arthritis ,   Abdominal   Peds  Hematology negative hematology ROS (+)   Anesthesia Other Findings Past Medical History: No date: Arthritis No date: Crohn disease ([place]) No date: Depression No date: GERD (gastroesophageal reflux disease) No date: Headache     Comment:  Migraines No date: Heart murmur No date: Hyperlipidemia No date: Hypothyroidism No date: Macular degeneration No date: MS (multiple sclerosis) ([place]) No date: Restless leg No date: Seizures ([place]) No date: Tremors of nervous system  Past Surgical History: 04/14/2018: APPENDECTOMY; Left     Comment:  Procedure: APPENDECTOMY;  Surgeon: Olean Ree, MD;                Location: [facility] ORS;  Service: General;  Laterality: Left; 10/30/2014: CATARACT EXTRACTION W/PHACO; Right     Comment:  Procedure: CATARACT EXTRACTION PHACO AND INTRAOCULAR               LENS PLACEMENT ([place]);  Surgeon: Estill Cotta, MD;                Location: [facility] ORS;  Service: Ophthalmology;  Laterality:              Right;  [place]  1:55.7  06/18/2015: CATARACT  EXTRACTION W/PHACO; Left     Comment:  Procedure: CATARACT EXTRACTION PHACO AND INTRAOCULAR               LENS PLACEMENT ([facility]);  Surgeon: Estill Cotta, MD;                Location: [facility] OR[facility];  Service: Ophthalmology;  Laterality:              Left;  [place] 01:36 AP% 26.1 CDE 42.19 fluid pack lo0t #               [id number] H 2014: COLONOSCOPY     Comment:  normal- Rein- repeat in 5 years No date: EYE SURGERY No date: FOOT SURGERY 04/14/2018: INGUINAL HERNIA REPAIR; Right     Comment:  Procedure: HERNIA REPAIR INGUINAL;  Surgeon: Olean Ree, MD;  Location: [facility] ORS;  Service: General;                Laterality: Right; No date: NASAL SINUS SURGERY No date: TONSILLECTOMY No date: TUBAL LIGATION No date: VAGINAL HYSTERECTOMY  BMI    Body Mass Index: 25.92 kg/m      Reproductive/Obstetrics negative OB ROS  Anesthesia Physical Anesthesia Plan  ASA: III  Anesthesia Plan: General   Post-op Pain Management:    Induction: Intravenous  PONV Risk Score and Plan: Propofol infusion and TIVA  Airway Management Planned: Natural Airway and Nasal Cannula  Additional Equipment:   Intra-op Plan:   Post-operative Plan:   Informed Consent: I have reviewed the patients History and Physical, chart, labs and discussed the procedure including the risks, benefits and alternatives for the proposed anesthesia with the patient or authorized representative who has indicated his/her understanding and acceptance.     Dental Advisory Given  Plan Discussed with: Anesthesiologist, CRNA and Surgeon  Anesthesia Plan Comments: (Patient consented for risks of anesthesia including but not limited to:  - adverse reactions to medications - risk of airway placement if required - damage to eyes, teeth, lips or other oral mucosa - nerve damage due to positioning  - sore throat or hoarseness - Damage to heart, brain, nerves, lungs,  other parts of body or loss of life  Patient voiced understanding.)        Anesthesia Quick Evaluation

## 2020-07-09 NOTE — Anesthesia Postprocedure Evaluation (Signed)
Anesthesia Post Note  Patient: Brooke Juarez  Procedure(s) Performed: COLONOSCOPY WITH PROPOFOL (N/A )  Patient location during evaluation: Endoscopy Anesthesia Type: General Level of consciousness: awake and alert Pain management: pain level controlled Vital Signs Assessment: post-procedure vital signs reviewed and stable Respiratory status: spontaneous breathing, nonlabored ventilation, respiratory function stable and patient connected to nasal cannula oxygen Cardiovascular status: blood pressure returned to baseline and stable Postop Assessment: no apparent nausea or vomiting Anesthetic complications: no   No complications documented.   Last Vitals:  Vitals:   07/09/20 1038 07/09/20 1048  BP: 128/88 135/73  Pulse:    Resp:    Temp:    SpO2: 100%     Last Pain:  Vitals:   07/09/20 1048  TempSrc:   PainSc: 0-No pain                 Precious Haws Danen Lapaglia

## 2020-07-09 NOTE — Op Note (Signed)
[facility] ([facility]) Gastroenterology Patient Name: Brooke Juarez Procedure Date: 07/09/2020 9:59 AM MRN: [id number] Account #: [id number] Date of Birth: 16-Jan-1951 Admit Type: Outpatient Age: 70 Room: [facility] ENDO ROOM 2 Gender: Female Note Status: Finalized Procedure:             Colonoscopy Indications:           Screening in patient at increased risk: Family history                         of 1st-degree relative with colorectal cancer Providers:             Benay Pike. Alice Reichert MD, MD Referring MD:          Juline Patch, MD (Referring MD) Medicines:             Propofol per Anesthesia Complications:         No immediate complications. Procedure:             Pre-Anesthesia Assessment:                        - The risks and benefits of the procedure and the                         sedation options and risks were discussed with the                         patient. All questions were answered and informed                         consent was obtained.                        - Patient identification and proposed procedure were                         verified prior to the procedure by the nurse. The                         procedure was verified in the procedure room.                        - ASA Grade Assessment: III - A patient with severe                         systemic disease.                        - After reviewing the risks and benefits, the patient                         was deemed in satisfactory condition to undergo the                         procedure.                        After obtaining informed consent, the colonoscope was                         passed under  direct vision. Throughout the procedure,                         the patient's blood pressure, pulse, and oxygen                         saturations were monitored continuously. The                         Colonoscope was introduced through the anus and                         advanced to the the cecum,  identified by appendiceal                         orifice and ileocecal valve. The colonoscopy was                         performed without difficulty. The patient tolerated                         the procedure well. The quality of the bowel                         preparation was good. The ileocecal valve, appendiceal                         orifice, and rectum were photographed. Findings:      The perianal and digital rectal examinations were normal. Pertinent       negatives include normal sphincter tone and no palpable rectal lesions.      Non-bleeding internal hemorrhoids were found during retroflexion. The       hemorrhoids were Grade I (internal hemorrhoids that do not prolapse).      Two sessile polyps were found in the rectum. The polyps were 3 to 4 mm       in size. These polyps were removed with a jumbo cold forceps. Resection       and retrieval were complete.      A single medium-sized localized angioectasia without bleeding was found       in the cecum.      The exam was otherwise without abnormality. Impression:            - Non-bleeding internal hemorrhoids.                        - Two 3 to 4 mm polyps in the rectum, removed with a                         jumbo cold forceps. Resected and retrieved.                        - A single non-bleeding colonic angioectasia.                        - The examination was otherwise normal. Recommendation:        - Patient has a contact number available for  emergencies. The signs and symptoms of potential                         delayed complications were discussed with the patient.                         Return to normal activities tomorrow. Written                         discharge instructions were provided to the patient.                        - Resume previous diet.                        - Continue present medications.                        - Repeat colonoscopy in 5 years for surveillance.                         - Return to GI office PRN.                        - The findings and recommendations were discussed with                         the patient. Procedure Code(s):     --- Professional ---                        [phone], Colonoscopy, flexible; with biopsy, single or                         multiple Diagnosis Code(s):     --- Professional ---                        K64.0, First degree hemorrhoids                        K55.20, Angiodysplasia of colon without hemorrhage                        K62.1, Rectal polyp                        Z80.0, Family history of malignant neoplasm of                         digestive organs CPT copyright 2019 American Medical Association. All rights reserved. The codes documented in this report are preliminary and upon coder review may  be revised to meet current compliance requirements. Efrain Sella MD, MD 07/09/2020 10:18:29 AM This report has been signed electronically. Number of Addenda: 0 Note Initiated On: 07/09/2020 9:59 AM Scope Withdrawal Time: 0 hours 6 minutes 17 seconds  Total Procedure Duration: 0 hours 11 minutes 10 seconds  Estimated Blood Loss:  Estimated blood loss: none. Estimated blood loss: none.      [facility]

## 2020-07-10 ENCOUNTER — Encounter: Payer: Self-pay | Admitting: Internal Medicine

## 2020-07-10 LAB — SURGICAL PATHOLOGY

## 2020-07-12 DIAGNOSIS — E538 Deficiency of other specified B group vitamins: Secondary | ICD-10-CM | POA: Diagnosis not present

## 2020-07-12 DIAGNOSIS — Z79899 Other long term (current) drug therapy: Secondary | ICD-10-CM | POA: Diagnosis not present

## 2020-08-04 ENCOUNTER — Other Ambulatory Visit: Payer: Self-pay | Admitting: Family Medicine

## 2020-08-04 DIAGNOSIS — E039 Hypothyroidism, unspecified: Secondary | ICD-10-CM

## 2020-08-07 DIAGNOSIS — H353221 Exudative age-related macular degeneration, left eye, with active choroidal neovascularization: Secondary | ICD-10-CM | POA: Diagnosis not present

## 2020-08-07 DIAGNOSIS — H353231 Exudative age-related macular degeneration, bilateral, with active choroidal neovascularization: Secondary | ICD-10-CM | POA: Diagnosis not present

## 2020-08-07 DIAGNOSIS — H353211 Exudative age-related macular degeneration, right eye, with active choroidal neovascularization: Secondary | ICD-10-CM | POA: Diagnosis not present

## 2020-10-02 DIAGNOSIS — H353231 Exudative age-related macular degeneration, bilateral, with active choroidal neovascularization: Secondary | ICD-10-CM | POA: Diagnosis not present

## 2020-10-02 DIAGNOSIS — H353211 Exudative age-related macular degeneration, right eye, with active choroidal neovascularization: Secondary | ICD-10-CM | POA: Diagnosis not present

## 2020-10-02 DIAGNOSIS — H353221 Exudative age-related macular degeneration, left eye, with active choroidal neovascularization: Secondary | ICD-10-CM | POA: Diagnosis not present

## 2020-10-04 ENCOUNTER — Telehealth: Payer: Self-pay

## 2020-10-04 NOTE — Telephone Encounter (Signed)
Spoke with patient. She said she was exposed to [facility] Monday. Her cousin came from out of town to visit for a few hours Monday. The cousin called yesterday and said they tested positive for Covid yesterday. Patient said she only has sinus symptoms at this time, but said she had this before her cousin came to visit. Told her typically symptoms develop 3-5 days after being exposed so told her to quarintine at home until 5 days is up at least. Told her if symptoms worsen then she needs to contact [facility] or [facility] to schedule a covid test.  She verbalized understanding of this.

## 2020-10-04 NOTE — Telephone Encounter (Signed)
Copied from [place] [id number]. Topic: General - Other >> Oct 04, 2020  8:01 AM Oneta Rack wrote: Reason for CRM: patient requesting to speak with Baxter Flattery regarding COVID, patient did not want to elaborate

## 2020-10-10 DIAGNOSIS — G2581 Restless legs syndrome: Secondary | ICD-10-CM | POA: Diagnosis not present

## 2020-10-10 DIAGNOSIS — R2689 Other abnormalities of gait and mobility: Secondary | ICD-10-CM | POA: Diagnosis not present

## 2020-10-10 DIAGNOSIS — R404 Transient alteration of awareness: Secondary | ICD-10-CM | POA: Diagnosis not present

## 2020-10-10 DIAGNOSIS — R4189 Other symptoms and signs involving cognitive functions and awareness: Secondary | ICD-10-CM | POA: Diagnosis not present

## 2020-11-05 ENCOUNTER — Other Ambulatory Visit: Payer: Self-pay | Admitting: Family Medicine

## 2020-11-05 DIAGNOSIS — E039 Hypothyroidism, unspecified: Secondary | ICD-10-CM

## 2020-11-07 ENCOUNTER — Ambulatory Visit (INDEPENDENT_AMBULATORY_CARE_PROVIDER_SITE_OTHER): Payer: Medicare HMO

## 2020-11-07 DIAGNOSIS — Z0000 Encounter for general adult medical examination without abnormal findings: Secondary | ICD-10-CM

## 2020-11-07 DIAGNOSIS — Z1231 Encounter for screening mammogram for malignant neoplasm of breast: Secondary | ICD-10-CM

## 2020-11-07 NOTE — Patient Instructions (Signed)
Brooke Juarez , Thank you for taking time to come for your Medicare Wellness Visit. I appreciate your ongoing commitment to your health goals. Please review the following plan we discussed and let me know if I can assist you in the future.   Screening recommendations/referrals: Colonoscopy: done 07/09/20. Repeat in 2027.  Mammogram: done 07/30/17. Please call [phone] to schedule your mammogram.  Bone Density: done 06/27/14. Please discuss repeat screening recommendations with Dr. Ronnald Ramp at your next appt.  Recommended yearly ophthalmology/optometry visit for glaucoma screening and checkup Recommended yearly dental visit for hygiene and checkup  Vaccinations: Influenza vaccine: done 02/06/20 Pneumococcal vaccine: done 12/08/16 Tdap vaccine: due Shingles vaccine: Shingrix discussed. Please contact your pharmacy for coverage information.  Covid-19: done 07/11/19, 08/01/19 & 02/01/20  Advanced directives: Advance directive discussed with you today. I have provided a copy for you to complete at home and have notarized. Once this is complete please bring a copy in to our office so we can scan it into your chart.   Conditions/risks identified: If you wish to quit smoking, help is available. For free tobacco cessation program offerings call the [facility] at [phone] or Live Well Line at 360-511-0479. You may also visit [email or web address] or email [email or web address] for more information on other programs.   Next appointment: Follow up in one year for your annual wellness visit    Preventive Care 65 Years and Older, Female Preventive care refers to lifestyle choices and visits with your health care provider that can promote health and wellness. What does preventive care include? A yearly physical exam. This is also called an annual well check. Dental exams once or twice a year. Routine eye exams. Ask your health care provider how often you should have your eyes  checked. Personal lifestyle choices, including: Daily care of your teeth and gums. Regular physical activity. Eating a healthy diet. Avoiding tobacco and drug use. Limiting alcohol use. Practicing safe sex. Taking low-dose aspirin every day. Taking vitamin and mineral supplements as recommended by your health care provider. What happens during an annual well check? The services and screenings done by your health care provider during your annual well check will depend on your age, overall health, lifestyle risk factors, and family history of disease. Counseling  Your health care provider may ask you questions about your: Alcohol use. Tobacco use. Drug use. Emotional well-being. Home and relationship well-being. Sexual activity. Eating habits. History of falls. Memory and ability to understand (cognition). Work and work Statistician. Reproductive health. Screening  You may have the following tests or measurements: Height, weight, and BMI. Blood pressure. Lipid and cholesterol levels. These may be checked every 5 years, or more frequently if you are over 17 years old. Skin check. Lung cancer screening. You may have this screening every year starting at age 31 if you have a 30-pack-year history of smoking and currently smoke or have quit within the past 15 years. Fecal occult blood test (FOBT) of the stool. You may have this test every year starting at age 66. Flexible sigmoidoscopy or colonoscopy. You may have a sigmoidoscopy every 5 years or a colonoscopy every 10 years starting at age 18. Hepatitis C blood test. Hepatitis B blood test. Sexually transmitted disease (STD) testing. Diabetes screening. This is done by checking your blood sugar (glucose) after you have not eaten for a while (fasting). You may have this done every 1-3 years. Bone density scan. This is done to screen for osteoporosis. You may have this  done starting at age 88. Mammogram. This may be done every 1-2  years. Talk to your health care provider about how often you should have regular mammograms. Talk with your health care provider about your test results, treatment options, and if necessary, the need for more tests. Vaccines  Your health care provider may recommend certain vaccines, such as: Influenza vaccine. This is recommended every year. Tetanus, diphtheria, and acellular pertussis (Tdap, Td) vaccine. You may need a Td booster every 10 years. Zoster vaccine. You may need this after age 8. Pneumococcal 13-valent conjugate (PCV13) vaccine. One dose is recommended after age 80. Pneumococcal polysaccharide (PPSV23) vaccine. One dose is recommended after age 47. Talk to your health care provider about which screenings and vaccines you need and how often you need them. This information is not intended to replace advice given to you by your health care provider. Make sure you discuss any questions you have with your health care provider. Document Released: 06/01/2015 Document Revised: 01/23/2016 Document Reviewed: 03/06/2015 Elsevier Interactive Patient Education  2017 [place] Prevention in the Home Falls can cause injuries. They can happen to people of all ages. There are many things you can do to make your home safe and to help prevent falls. What can I do on the outside of my home? Regularly fix the edges of walkways and driveways and fix any cracks. Remove anything that might make you trip as you walk through a door, such as a raised step or threshold. Trim any bushes or trees on the path to your home. Use bright outdoor lighting. Clear any walking paths of anything that might make someone trip, such as rocks or tools. Regularly check to see if handrails are loose or broken. Make sure that both sides of any steps have handrails. Any raised decks and porches should have guardrails on the edges. Have any leaves, snow, or ice cleared regularly. Use sand or salt on walking paths  during winter. Clean up any spills in your garage right away. This includes oil or grease spills. What can I do in the bathroom? Use night lights. Install grab bars by the toilet and in the tub and shower. Do not use towel bars as grab bars. Use non-skid mats or decals in the tub or shower. If you need to sit down in the shower, use a plastic, non-slip stool. Keep the floor dry. Clean up any water that spills on the floor as soon as it happens. Remove soap buildup in the tub or shower regularly. Attach bath mats securely with double-sided non-slip rug tape. Do not have throw rugs and other things on the floor that can make you trip. What can I do in the bedroom? Use night lights. Make sure that you have a light by your bed that is easy to reach. Do not use any sheets or blankets that are too big for your bed. They should not hang down onto the floor. Have a firm chair that has side arms. You can use this for support while you get dressed. Do not have throw rugs and other things on the floor that can make you trip. What can I do in the kitchen? Clean up any spills right away. Avoid walking on wet floors. Keep items that you use a lot in easy-to-reach places. If you need to reach something above you, use a strong step stool that has a grab bar. Keep electrical cords out of the way. Do not use floor polish or wax  that makes floors slippery. If you must use wax, use non-skid floor wax. Do not have throw rugs and other things on the floor that can make you trip. What can I do with my stairs? Do not leave any items on the stairs. Make sure that there are handrails on both sides of the stairs and use them. Fix handrails that are broken or loose. Make sure that handrails are as long as the stairways. Check any carpeting to make sure that it is firmly attached to the stairs. Fix any carpet that is loose or worn. Avoid having throw rugs at the top or bottom of the stairs. If you do have throw  rugs, attach them to the floor with carpet tape. Make sure that you have a light switch at the top of the stairs and the bottom of the stairs. If you do not have them, ask someone to add them for you. What else can I do to help prevent falls? Wear shoes that: Do not have high heels. Have rubber bottoms. Are comfortable and fit you well. Are closed at the toe. Do not wear sandals. If you use a stepladder: Make sure that it is fully opened. Do not climb a closed stepladder. Make sure that both sides of the stepladder are locked into place. Ask someone to hold it for you, if possible. Clearly mark and make sure that you can see: Any grab bars or handrails. First and last steps. Where the edge of each step is. Use tools that help you move around (mobility aids) if they are needed. These include: Canes. Walkers. Scooters. Crutches. Turn on the lights when you go into a dark area. Replace any light bulbs as soon as they burn out. Set up your furniture so you have a clear path. Avoid moving your furniture around. If any of your floors are uneven, fix them. If there are any pets around you, be aware of where they are. Review your medicines with your doctor. Some medicines can make you feel dizzy. This can increase your chance of falling. Ask your doctor what other things that you can do to help prevent falls. This information is not intended to replace advice given to you by your health care provider. Make sure you discuss any questions you have with your health care provider. Document Released: 03/01/2009 Document Revised: 10/11/2015 Document Reviewed: 06/09/2014 Elsevier Interactive Patient Education  2017 [employer].

## 2020-11-07 NOTE — Progress Notes (Signed)
Subjective:   Brooke Juarez is a 70 y.o. female who presents for Medicare Annual (Subsequent) preventive examination.  Virtual Visit via Telephone Note  I connected with  Brooke Juarez on 11/07/20 at  9:20 AM EDT by telephone and verified that I am speaking with the correct person using two identifiers.  Location: Patient: home Provider: [facility] [name]   I discussed the limitations, risks, security and privacy concerns of performing an evaluation and management service by telephone and the availability of in person appointments. The patient expressed understanding and agreed to proceed.  Interactive audio and video telecommunications were attempted between this nurse and patient, however failed, due to patient having technical difficulties OR patient did not have access to video capability.  We continued and completed visit with audio only.  Some vital signs may be absent or patient reported.   Clemetine Marker, LPN   Review of Systems     Cardiac Risk Factors include: advanced age (>34mn, >>57women);dyslipidemia;sedentary lifestyle;smoking/ tobacco exposure     Objective:    Today's Vitals   11/07/20 0923  PainSc: 7    There is no height or weight on file to calculate BMI.  Advanced Directives 11/07/2020 07/09/2020 11/07/2019 11/03/2018 04/14/2018 04/04/2018 07/16/2017  Does Patient Have a Medical Advance Directive? No No No No No No No  Would patient like information on creating a medical advance directive? Yes (MAU/Ambulatory/Procedural Areas - Information given) - No - Patient declined Yes (MAU/Ambulatory/Procedural Areas - Information given) No - Patient declined - Yes (MAU/Ambulatory/Procedural Areas - Information given)    Current Medications (verified) Outpatient Encounter Medications as of 11/07/2020  Medication Sig   acetaminophen (TYLENOL) 500 MG tablet Take 1,000 mg by mouth 2 (two) times daily as needed for  moderate pain or headache.   alendronate (FOSAMAX) 70 MG tablet TAKE 1 TABLET BY MOUTH ONCE A WEEK ON AN EMPTY STOMACH WITH  A  FULL  GLASS  OF  WATER   cholecalciferol (VITAMIN D3) 10 MCG (400 UNIT) TABS tablet Take 400 Units by mouth.   EUTHYROX 112 MCG tablet Take 1 tablet by mouth once daily   montelukast (SINGULAIR) 10 MG tablet Take 1 tablet (10 mg total) by mouth at bedtime.   Multiple Vitamins-Minerals (PRESERVISION AREDS 2 PO) Take 2 capsules by mouth daily.   Omega-3 1000 MG CAPS Take 1,000 mg by mouth daily.   pantoprazole (PROTONIX) 40 MG tablet Take 1 tablet (40 mg total) by mouth daily.   potassium chloride (KLOR-CON) 10 MEQ tablet Take 1 tablet (10 mEq total) by mouth daily. Pt taking 1 tab 3 days per week only   sulfaSALAzine (AZULFIDINE) 500 MG EC tablet Take 1 tablet by mouth 2 (two) times daily before a meal.   traZODone (DESYREL) 100 MG tablet Take 1 tablet (100 mg total) by mouth daily.   vitamin B-12 (CYANOCOBALAMIN) 500 MCG tablet Take 500 mcg by mouth every 14 (fourteen) days.    gabapentin (NEURONTIN) 300 MG capsule Take 1 capsule by mouth 4 (four) times daily. Shah   rOPINIRole (REQUIP) 1 MG tablet Take 1.5 tablets by mouth every evening. neuro   [DISCONTINUED] pramipexole (MIRAPEX) 0.25 MG tablet Take by mouth. neuro   No facility-administered encounter medications on file as of 11/07/2020.    Allergies (verified) Codeine   History: Past Medical History:  Diagnosis Date   Arthritis    [place]    Depression    GERD (  gastroesophageal reflux disease)    Headache    Migraines   Heart murmur    Hyperlipidemia    Hypothyroidism    Macular degeneration    MS (multiple sclerosis) ([place])    Restless leg    Seizures ([place])    Tremors of nervous system    Past Surgical History:  Procedure Laterality Date   APPENDECTOMY Left 04/14/2018   Procedure: APPENDECTOMY;  Surgeon: Olean Ree, MD;  Location: [facility] ORS;  Service: General;  Laterality: Left;    CATARACT EXTRACTION W/PHACO Right 10/30/2014   Procedure: CATARACT EXTRACTION PHACO AND INTRAOCULAR LENS PLACEMENT ([place]);  Surgeon: Estill Cotta, MD;  Location: [facility] ORS;  Service: Ophthalmology;  Laterality: Right;  [place]  1:55.7    CATARACT EXTRACTION W/PHACO Left 06/18/2015   Procedure: CATARACT EXTRACTION PHACO AND INTRAOCULAR LENS PLACEMENT ([facility]);  Surgeon: Estill Cotta, MD;  Location: [facility] ORS;  Service: Ophthalmology;  Laterality: Left;  [place] 01:36 AP% 26.1 CDE 42.19 fluid pack lo0t # [id number] H   COLONOSCOPY  2014   normal- Rein- repeat in 5 years   COLONOSCOPY WITH PROPOFOL N/A 07/09/2020   Procedure: COLONOSCOPY WITH PROPOFOL;  Surgeon: Tol[place], Benay Pike, MD;  Location: [facility] ENDOSCOPY;  Service: Gastroenterology;  Laterality: N/A;   EYE SURGERY     FOOT SURGERY     INGUINAL HERNIA REPAIR Right 04/14/2018   Procedure: HERNIA REPAIR INGUINAL;  Surgeon: Olean Ree, MD;  Location: [facility] ORS;  Service: General;  Laterality: Right;   NASAL SINUS SURGERY     TONSILLECTOMY     TUBAL LIGATION     VAGINAL HYSTERECTOMY     Family History  Problem Relation Age of Onset   Heart disease Mother    Stroke Mother    Cancer Mother        colon   Mitral valve prolapse Mother    Stroke Father    Breast cancer Neg Hx    Social History   Socioeconomic History   Marital status: Married    Spouse name: Not on file   Number of children: 3   Years of education: some college   Highest education level: 12th grade  Occupational History   Occupation: Retired  Tobacco Use   Smoking status: Every Day    Packs/day: 0.75    Years: 55.00    Pack years: 41.25    Types: Cigarettes   Smokeless tobacco: Never  Vaping Use   Vaping Use: Never used  Substance and Sexual Activity   Alcohol use: No    Alcohol/week: 0.0 standard drinks   Drug use: No   Sexual activity: Not on file  Other Topics Concern   Not on file  Social History Narrative   Not on file   Social Determinants of Health    Financial Resource Strain: Low Risk    Difficulty of Paying Living Expenses: Not hard at all  Food Insecurity: No Food Insecurity   Worried About Charity fundraiser in the Last Year: Never true   Ran Out of Food in the Last Year: Never true  Transportation Needs: No Transportation Needs   Lack of Transportation (Medical): No   Lack of Transportation (Non-Medical): No  Physical Activity: Inactive   Days of Exercise per Week: 0 days   Minutes of Exercise per Session: 0 min  Stress: No Stress Concern Present   Feeling of Stress : Only a little  Social Connections: Moderately Integrated   Frequency of Communication with Friends and Family: More than three times  a week   Frequency of Social Gatherings with Friends and Family: Three times a week   Attends Religious Services: More than 4 times per year   Active Member of Clubs or Organizations: No   Attends Archivist Meetings: Never   Marital Status: Married    Tobacco Counseling Ready to quit: No Counseling given: Yes   Clinical Intake:  Pre-visit preparation completed: Yes  Pain : 0-10 Pain Score: 7  Pain Type: Chronic pain Pain Location: Back (legs, eyes) Pain Descriptors / Indicators: Aching, Sore Pain Onset: More than a month ago Pain Frequency: Constant     Nutritional Risks: None Diabetes: No  How often do you need to have someone help you when you read instructions, pamphlets, or other written materials from your doctor or pharmacy?: 1 - Never    Interpreter Needed?: No  Information entered by :: Clemetine Marker LPN   Activities of Daily Living In your present state of health, do you have any difficulty performing the following activities: 11/07/2020  Hearing? N  Comment declines hearing aids  Vision? Y  Difficulty concentrating or making decisions? Y  Walking or climbing stairs? Y  Dressing or bathing? Y  Doing errands, shopping? Y  Preparing Food and eating ? N  Using the Toilet? N  In the  past six months, have you accidently leaked urine? Y  Comment wears pads for protection  Do you have problems with loss of bowel control? N  Managing your Medications? N  Managing your Finances? N  Housekeeping or managing your Housekeeping? N  Some recent data might be hidden    Patient Care Team: Juline Patch, MD as PCP - General (Family Medicine) Vladimir Crofts, MD as Consulting Physician (Neurology) Chauncey Mann, MD as Consulting Physician (Psychiatry) Michael Boston, MD as Consulting Physician (General Surgery) Manya Silvas, MD (Inactive) as Consulting Physician (Gastroenterology)  Indicate any recent Medical Services you may have received from other than [facility] providers in the past year (date may be approximate).     Assessment:   This is a routine wellness examination for [place].  Hearing/Vision screen Hearing Screening - Comments:: Pt denies hearing difficulty Vision Screening - Comments:: Annual vision screenings done at [facility] Dr. Thomasene Ripple  Dietary issues and exercise activities discussed: Current Exercise Habits: The patient does not participate in regular exercise at present, Exercise limited by: neurologic condition(s);orthopedic condition(s)   Goals Addressed             This Visit's Progress    Quit Smoking       If you wish to quit smoking, help is available. For free tobacco cessation program offerings call the [facility] at (785) 288-5092 or Live Well Line at 725-394-4511. You may also visit [email or web address] or email [email or web address] for more information on other programs.          Depression Screen PHQ 2/9 Scores 11/07/2020 06/15/2020 12/07/2019 11/07/2019 12/01/2018 11/03/2018 11/25/2017  PHQ - 2 Score 0 0 0 0 0 0 0  PHQ- 9 Score - 0 0 - 4 - 0    Fall Risk Fall Risk  11/07/2020 12/07/2019 11/07/2019 11/03/2018 04/28/2018  Falls in the past year? 1 1 0 0 0  Number falls in past yr: 0 1 0 0 0  Injury with  Fall? 0 0 0 0 0  Risk for fall due to : Impaired balance/gait - Impaired balance/gait;Impaired mobility;Impaired vision - -  Risk for fall due to:  Comment - - - - -  Follow up Falls prevention discussed Falls evaluation completed Falls prevention discussed Falls prevention discussed -    FALL RISK PREVENTION PERTAINING TO THE HOME:  Any stairs in or around the home? Yes  If so, are there any without handrails? No  Home free of loose throw rugs in walkways, pet beds, electrical cords, etc? Yes  Adequate lighting in your home to reduce risk of falls? Yes   ASSISTIVE DEVICES UTILIZED TO PREVENT FALLS:  Life alert? No  Use of a cane, walker or w/c? Yes  Grab bars in the bathroom? Yes  Shower chair or bench in shower? Yes  Elevated toilet seat or a handicapped toilet? Yes   TIMED UP AND GO:  Was the test performed? No . Telephonic visit.   Cognitive Function: Normal cognitive status assessed by direct observation by this Nurse Health Advisor. No abnormalities found.       6CIT Screen 11/07/2019 11/03/2018 07/16/2017  What Year? 0 points 0 points 0 points  What month? 0 points 0 points 0 points  What time? 0 points 0 points 0 points  Count back from 20 0 points 0 points 0 points  Months in reverse 0 points 0 points 0 points  Repeat phrase 0 points 0 points 0 points  Total Score 0 0 0    Immunizations Immunization History  Administered Date(s) Administered   Fluad Quad(high Dose 65+) 06/07/2019, 02/06/2020   Influenza, High Dose Seasonal PF 04/09/2018   Influenza,inj,Quad PF,6+ Mos 06/16/2017   PFIZER(Purple Top)SARS-COV-2 Vaccination 07/11/2019, 08/01/2019, 02/01/2020   Pneumococcal Conjugate-13 05/25/2014   Pneumococcal Polysaccharide-23 05/20/2007, 05/25/2014, 12/08/2016   Tdap 05/20/2007    TDAP status: Due, Education has been provided regarding the importance of this vaccine. Advised may receive this vaccine at local pharmacy or Health Dept. Aware to provide a copy of  the vaccination record if obtained from local pharmacy or Health Dept. Verbalized acceptance and understanding.  Flu Vaccine status: Up to date  Pneumococcal vaccine status: Up to date  Covid-19 vaccine status: Completed vaccines  Qualifies for Shingles Vaccine? Yes   Zostavax completed No   Shingrix Completed?: No.    Education has been provided regarding the importance of this vaccine. Patient has been advised to call insurance company to determine out of pocket expense if they have not yet received this vaccine. Advised may also receive vaccine at local pharmacy or Health Dept. Verbalized acceptance and understanding.  Screening Tests Health Maintenance  Topic Date Due   Zoster Vaccines- Shingrix (1 of 2) Never done   PNA vac Low Risk Adult (2 of 2 - PCV13) 12/08/2017   MAMMOGRAM  07/31/2018   COVID-19 Vaccine (4 - Booster for Pfizer series) 05/02/2020   TETANUS/TDAP  06/15/2021 (Originally 05/19/2017)   INFLUENZA VACCINE  12/17/2020   COLONOSCOPY (Pts 45-65yr Insurance coverage will need to be confirmed)  07/09/2025   DEXA SCAN  Completed   Hepatitis C Screening  Completed   HPV VACCINES  Aged Out    Health Maintenance  Health Maintenance Due  Topic Date Due   Zoster Vaccines- Shingrix (1 of 2) Never done   PNA vac Low Risk Adult (2 of 2 - PCV13) 12/08/2017   MAMMOGRAM  07/31/2018   COVID-19 Vaccine (4 - Booster for Pfizer series) 05/02/2020    Colorectal cancer screening: Type of screening: Colonoscopy. Completed 07/09/20. Repeat every 5 years  Mammogram status: Completed 07/30/17. Repeat every year. Ordered today.   Bone Density status: Completed  06/27/14. Results reflect: Bone density results: OSTEOPOROSIS. Repeat every as directed years.  Lung Cancer Screening: (Low Dose CT Chest recommended if Age 22-80 years, 30 pack-year currently smoking OR have quit w/in 15years.) does not qualify.   Additional Screening:  Hepatitis C Screening: does qualify; Completed  12/08/16  Vision Screening: Recommended annual ophthalmology exams for early detection of glaucoma and other disorders of the eye. Is the patient up to date with their annual eye exam?  Yes  Who is the provider or what is the name of the office in which the patient attends annual eye exams? [facility].   Dental Screening: Recommended annual dental exams for proper oral hygiene  Community Resource Referral / Chronic Care Management: CRR required this visit?  No   CCM required this visit?  No      Plan:     I have personally reviewed and noted the following in the patient's chart:   Medical and social history Use of alcohol, tobacco or illicit drugs  Current medications and supplements including opioid prescriptions.  Functional ability and status Nutritional status Physical activity Advanced directives List of other physicians Hospitalizations, surgeries, and ER visits in previous 12 months Vitals Screenings to include cognitive, depression, and falls Referrals and appointments  In addition, I have reviewed and discussed with patient certain preventive protocols, quality metrics, and best practice recommendations. A written personalized care plan for preventive services as well as general preventive health recommendations were provided to patient.     Clemetine Marker, LPN   [birth year]   Nurse Notes: none

## 2020-11-27 DIAGNOSIS — H353231 Exudative age-related macular degeneration, bilateral, with active choroidal neovascularization: Secondary | ICD-10-CM | POA: Diagnosis not present

## 2020-11-27 DIAGNOSIS — H353211 Exudative age-related macular degeneration, right eye, with active choroidal neovascularization: Secondary | ICD-10-CM | POA: Diagnosis not present

## 2020-11-27 DIAGNOSIS — H353221 Exudative age-related macular degeneration, left eye, with active choroidal neovascularization: Secondary | ICD-10-CM | POA: Diagnosis not present

## 2020-11-30 ENCOUNTER — Other Ambulatory Visit: Payer: Self-pay

## 2020-11-30 ENCOUNTER — Encounter: Payer: Self-pay | Admitting: Family Medicine

## 2020-11-30 ENCOUNTER — Ambulatory Visit (INDEPENDENT_AMBULATORY_CARE_PROVIDER_SITE_OTHER): Payer: Medicare HMO | Admitting: Family Medicine

## 2020-11-30 DIAGNOSIS — J301 Allergic rhinitis due to pollen: Secondary | ICD-10-CM

## 2020-11-30 DIAGNOSIS — E039 Hypothyroidism, unspecified: Secondary | ICD-10-CM

## 2020-11-30 DIAGNOSIS — F330 Major depressive disorder, recurrent, mild: Secondary | ICD-10-CM

## 2020-11-30 DIAGNOSIS — M8000XS Age-related osteoporosis with current pathological fracture, unspecified site, sequela: Secondary | ICD-10-CM

## 2020-11-30 DIAGNOSIS — E876 Hypokalemia: Secondary | ICD-10-CM | POA: Diagnosis not present

## 2020-11-30 DIAGNOSIS — K219 Gastro-esophageal reflux disease without esophagitis: Secondary | ICD-10-CM

## 2020-11-30 MED ORDER — MONTELUKAST SODIUM 10 MG PO TABS
10.0000 mg | ORAL_TABLET | Freq: Every day | ORAL | 1 refills | Status: DC
Start: 2020-11-30 — End: 2021-06-03

## 2020-11-30 MED ORDER — PANTOPRAZOLE SODIUM 40 MG PO TBEC: 40.0000 mg | DELAYED_RELEASE_TABLET | Freq: Every day | ORAL | 1 refills | Status: DC

## 2020-11-30 MED ORDER — TRAZODONE HCL 100 MG PO TABS: 100.0000 mg | ORAL_TABLET | Freq: Every day | ORAL | 1 refills | Status: DC

## 2020-11-30 MED ORDER — POTASSIUM CHLORIDE ER 10 MEQ PO TBCR: 10.0000 meq | EXTENDED_RELEASE_TABLET | Freq: Every day | ORAL | 1 refills | Status: DC

## 2020-11-30 MED ORDER — ALENDRONATE SODIUM 70 MG PO TABS: ORAL_TABLET | ORAL | 1 refills | Status: DC

## 2020-11-30 MED ORDER — LEVOTHYROXINE SODIUM 112 MCG PO TABS: 112.0000 ug | ORAL_TABLET | Freq: Every day | ORAL | 1 refills | Status: DC

## 2020-11-30 NOTE — Progress Notes (Signed)
Date:  11/30/2020   Name:  Brooke Juarez   DOB:  02-26-1951   MRN:  [id number]   Chief Complaint: Gastroesophageal Reflux, Hypothyroidism, Allergic Rhinitis , Osteoporosis, hypokalemia, and Insomnia  Gastroesophageal Reflux She reports no abdominal pain, no belching, no chest pain, no choking, no coughing, no dysphagia, no early satiety, no globus sensation, no heartburn, no hoarse voice, no nausea, no sore throat, no stridor, no tooth decay, no water brash or no wheezing. This is a chronic problem. The current episode started more than 1 year ago. The problem has been waxing and waning. The symptoms are aggravated by certain foods. Pertinent negatives include no anemia, fatigue, melena, muscle weakness, orthopnea or weight loss. Risk factors include smoking/tobacco exposure. She has tried a PPI for the symptoms. The treatment provided mild relief. Past procedures do not include an abdominal ultrasound, an EGD, esophageal manometry, esophageal pH monitoring, H. pylori antibody titer or a UGI.  Insomnia Primary symptoms: malaise/fatigue.   The current episode started more than one year. The problem occurs intermittently. The problem has been waxing and waning since onset. The treatment provided mild relief.  Thyroid Problem Presents for follow-up visit. Patient reports no anxiety, cold intolerance, constipation, depressed mood, diaphoresis, diarrhea, dry skin, fatigue, hair loss, heat intolerance, hoarse voice, leg swelling, menstrual problem, nail problem, palpitations, tremors, visual change, weight gain or weight loss. The symptoms have been stable.  URI  Chronicity: for allergies. The problem has been gradually improving. There has been no fever. Pertinent negatives include no abdominal pain, chest pain, coughing, diarrhea, dysuria, ear pain, headaches, nausea, neck pain, rash, sore throat or wheezing.   Lab Results  Component Value Date   CREATININE 0.87 06/15/2020   BUN 5 (L) 06/15/2020    NA 141 06/15/2020   K 4.4 06/15/2020   CL 100 06/15/2020   CO2 24 06/15/2020   Lab Results  Component Value Date   CHOL 215 (H) 06/15/2020   HDL 44 06/15/2020   LDLCALC 149 (H) 06/15/2020   TRIG 123 06/15/2020   CHOLHDL 3.6 06/16/2017   Lab Results  Component Value Date   TSH 5.520 (H) 06/15/2020   No results found for: HGBA1C Lab Results  Component Value Date   WBC 10.0 04/04/2018   HGB 12.5 04/04/2018   HCT 39.7 04/04/2018   MCV 102.6 (H) 04/04/2018   PLT 152 04/04/2018   Lab Results  Component Value Date   ALT 9 06/15/2020   AST 16 06/15/2020   ALKPHOS 167 (H) 06/15/2020   BILITOT 0.6 06/15/2020     Review of Systems  Constitutional:  Positive for malaise/fatigue. Negative for chills, diaphoresis, fatigue, fever, weight gain and weight loss.  HENT:  Negative for drooling, ear discharge, ear pain, hoarse voice and sore throat.   Respiratory:  Negative for cough, choking, shortness of breath and wheezing.   Cardiovascular:  Negative for chest pain, palpitations and leg swelling.  Gastrointestinal:  Negative for abdominal pain, blood in stool, constipation, diarrhea, dysphagia, heartburn, melena and nausea.  Endocrine: Negative for cold intolerance, heat intolerance and polydipsia.  Genitourinary:  Negative for dysuria, frequency, hematuria, menstrual problem and urgency.  Musculoskeletal:  Negative for back pain, myalgias, muscle weakness and neck pain.  Skin:  Negative for rash.  Allergic/Immunologic: Negative for environmental allergies.  Neurological:  Negative for dizziness, tremors and headaches.  Hematological:  Does not bruise/bleed easily.  Psychiatric/Behavioral:  Negative for suicidal ideas. The patient has insomnia. The patient is not nervous/anxious.  Patient Active Problem List   Diagnosis Date Noted   Mixed hyperlipidemia 06/07/2019   Irreducible right femoral hernia 04/28/2018   Limited mobility with chronic pain & MS 04/05/2018   Tobacco  abuse 04/05/2018   MS (multiple sclerosis) ([place])    Hypokalemia 11/25/2017   Seasonal allergic rhinitis due to pollen 12/08/2016   CAD (coronary artery disease) 11/27/2014   Medicare annual wellness visit, subsequent 11/27/2014   Vitamin D deficiency 11/22/2014   Familial multiple lipoprotein-type hyperlipidemia 09/29/2014   Clinical depression 09/29/2014   Acid reflux 09/29/2014   Routine general medical examination at a health care facility 09/29/2014   Restless leg 09/29/2014   DS (disseminated sclerosis) ([place]) 09/29/2014   Nicotine addiction 09/29/2014   OP (osteoporosis) 09/29/2014   Adult hypothyroidism 10/05/2013   Collagenous colitis 10/05/2013   Hypothyroid 10/05/2013    Allergies  Allergen Reactions   Codeine Nausea And Vomiting    Past Surgical History:  Procedure Laterality Date   APPENDECTOMY Left 04/14/2018   Procedure: APPENDECTOMY;  Surgeon: Olean Ree, MD;  Location: [facility];  Service: General;  Laterality: Left;   CATARACT EXTRACTION W/PHACO Right 10/30/2014   Procedure: CATARACT EXTRACTION PHACO AND INTRAOCULAR LENS PLACEMENT ([place]);  Surgeon: Estill Cotta, MD;  Location: [facility];  Service: Ophthalmology;  Laterality: Right;  [place]  1:55.7    CATARACT EXTRACTION W/PHACO Left 06/18/2015   Procedure: CATARACT EXTRACTION PHACO AND INTRAOCULAR LENS PLACEMENT ([facility]);  Surgeon: Estill Cotta, MD;  Location: [facility];  Service: Ophthalmology;  Laterality: Left;  [place] 01:36 AP% 26.1 CDE 42.19 fluid pack lo0t # [id number]   COLONOSCOPY  2014   normal- Rein- repeat in 5 years   COLONOSCOPY WITH PROPOFOL N/A 07/09/2020   Procedure: COLONOSCOPY WITH PROPOFOL;  Surgeon: Toledo, Benay Pike, MD;  Location: [facility];  Service: Gastroenterology;  Laterality: N/A;   EYE SURGERY     FOOT SURGERY     INGUINAL HERNIA REPAIR Right 04/14/2018   Procedure: HERNIA REPAIR INGUINAL;  Surgeon: Olean Ree, MD;  Location: [facility];  Service: General;  Laterality: Right;    NASAL SINUS SURGERY     TONSILLECTOMY     TUBAL LIGATION     VAGINAL HYSTERECTOMY      Social History   Tobacco Use   Smoking status: Every Day    Packs/day: 0.75    Years: 55.00    Pack years: 41.25    Types: Cigarettes   Smokeless tobacco: Never  Vaping Use   Vaping Use: Never used  Substance Use Topics   Alcohol use: No    Alcohol/week: 0.0 standard drinks   Drug use: No     Medication list has been reviewed and updated.  Current Meds  Medication Sig   acetaminophen (TYLENOL) 500 MG tablet Take 1,000 mg by mouth 2 (two) times daily as needed for moderate pain or headache.   alendronate (FOSAMAX) 70 MG tablet TAKE 1 TABLET BY MOUTH ONCE A WEEK ON AN EMPTY STOMACH WITH  A  FULL  GLASS  OF  WATER   cholecalciferol (VITAMIN D3) 10 MCG (400 UNIT) TABS tablet Take 400 Units by mouth.   EUTHYROX 112 MCG tablet Take 1 tablet by mouth once daily   gabapentin (NEURONTIN) 300 MG capsule Take 1 capsule by mouth 4 (four) times daily. Shah   montelukast (SINGULAIR) 10 MG tablet Take 1 tablet (10 mg total) by mouth at bedtime.   Multiple Vitamins-Minerals (PRESERVISION AREDS 2 PO) Take 2 capsules by mouth daily.  Omega-3 1000 MG CAPS Take 1,000 mg by mouth daily.   pantoprazole (PROTONIX) 40 MG tablet Take 1 tablet (40 mg total) by mouth daily.   potassium chloride (KLOR-CON) 10 MEQ tablet Take 1 tablet (10 mEq total) by mouth daily. Pt taking 1 tab 3 days per week only   rOPINIRole (REQUIP) 1 MG tablet Take 1.5 tablets by mouth every evening. neuro   sulfaSALAzine (AZULFIDINE) 500 MG EC tablet Take 1 tablet by mouth 2 (two) times daily before a meal.   traZODone (DESYREL) 100 MG tablet Take 1 tablet (100 mg total) by mouth daily.   vitamin B-12 (CYANOCOBALAMIN) 500 MCG tablet Take 500 mcg by mouth every 14 (fourteen) days.     PHQ 2/9 Scores 11/30/2020 11/07/2020 06/15/2020 12/07/2019  PHQ - 2 Score 0 0 0 0  PHQ- 9 Score 0 - 0 0    GAD 7 : Generalized Anxiety Score 11/30/2020  06/15/2020 12/07/2019  Nervous, Anxious, on Edge 0 0 0  Control/stop worrying 0 0 0  Worry too much - different things 0 0 0  Trouble relaxing 0 0 0  Restless 0 0 0  Easily annoyed or irritable 0 0 0  Afraid - awful might happen 0 0 0  Total GAD 7 Score 0 0 0    BP Readings from Last 3 Encounters:  11/30/20 120/70  07/09/20 135/73  06/15/20 128/70    Physical Exam Vitals and nursing note reviewed.  Constitutional:      General: She is not in acute distress.    Appearance: She is not diaphoretic.  HENT:     Head: Normocephalic and atraumatic.     Right Ear: Tympanic membrane, ear canal and external ear normal.     Left Ear: Tympanic membrane, ear canal and external ear normal.     Nose: Nose normal. No congestion or rhinorrhea.  Eyes:     General:        Right eye: No discharge.        Left eye: No discharge.     Conjunctiva/sclera: Conjunctivae normal.     Pupils: Pupils are equal, round, and reactive to light.  Neck:     Thyroid: No thyromegaly.     Vascular: No JVD.  Cardiovascular:     Rate and Rhythm: Normal rate and regular rhythm.     Heart sounds: Normal heart sounds. No murmur heard.   No friction rub. No gallop.  Pulmonary:     Effort: Pulmonary effort is normal.     Breath sounds: Normal breath sounds. No wheezing or rhonchi.  Abdominal:     General: Bowel sounds are normal.     Palpations: Abdomen is soft. There is no mass.     Tenderness: There is no abdominal tenderness. There is no guarding.  Musculoskeletal:        General: Normal range of motion.     Cervical back: Normal range of motion and neck supple.  Lymphadenopathy:     Cervical: No cervical adenopathy.  Skin:    General: Skin is warm and dry.  Neurological:     Mental Status: She is alert.     Deep Tendon Reflexes: Reflexes are normal and symmetric.    Wt Readings from Last 3 Encounters:  11/30/20 144 lb (65.3 kg)  07/09/20 144 lb (65.3 kg)  06/15/20 145 lb (65.8 kg)    BP 120/70    Pulse 72   Ht 5' 2.5" (1.588 m)   Wt 144 lb (65.3  kg)   LMP  (LMP Unknown) Comment: had hysterectomy  BMI 25.92 kg/m   Assessment and Plan:  1. Adult hypothyroidism Chronic.  Controlled.  Stable.  Continue levothyroxine 112 mcg daily.  Check thyroid panel with TSH. - levothyroxine (EUTHYROX) 112 MCG tablet; Take 1 tablet (112 mcg total) by mouth daily.  Dispense: 90 tablet; Refill: 1 - Thyroid Panel With TSH  2. Gastroesophageal reflux disease, unspecified whether esophagitis present Chronic.  Episodic.  Certain foods are still intolerable.  But for the most part patient is doing well on the pantoprazole 40 mg once a day. - pantoprazole (PROTONIX) 40 MG tablet; Take 1 tablet (40 mg total) by mouth daily.  Dispense: 90 tablet; Refill: 1  3. Seasonal allergic rhinitis due to pollen .  Controlled.  Stable.  Continue Singulair 10 mg once a day. - montelukast (SINGULAIR) 10 MG tablet; Take 1 tablet (10 mg total) by mouth at bedtime.  Dispense: 90 tablet; Refill: 1  4. Mild episode of recurrent major depressive disorder (HCC) Chronic.  Controlled.  Stable.  Depression is controlled with Vistaril 100 mg daily primarily taken at night for sleep as well. - traZODone (DESYREL) 100 MG tablet; Take 1 tablet (100 mg total) by mouth daily.  Dispense: 90 tablet; Refill: 1  5. Age-related osteoporosis with current pathological fracture, sequela Chronic.  Controlled.  Stable.  Continue Fosamax 70 mg once weekly. - alendronate (FOSAMAX) 70 MG tablet; TAKE 1 TABLET BY MOUTH ONCE A WEEK ON AN EMPTY STOMACH WITH  A  FULL  GLASS  OF  WATER  Dispense: 12 tablet; Refill: 1  6. Hypokalemia .  Controlled.  Stable.  Recheck of labs potassium was acceptable we will continue potassium chloride 10 mEq 1 tablet 3 days/week. - potassium chloride (KLOR-CON) 10 MEQ tablet; Take 1 tablet (10 mEq total) by mouth daily. Pt taking 1 tab 3 days per week only  Dispense: 36 tablet; Refill: 1

## 2020-12-01 LAB — THYROID PANEL WITH TSH
Free Thyroxine Index: 3.4 (ref 1.2–4.9)
T3 Uptake Ratio: 30 % (ref 24–39)
T4, Total: 11.2 ug/dL (ref 4.5–12.0)
TSH: 2.9 u[IU]/mL (ref 0.450–4.500)

## 2020-12-04 DIAGNOSIS — K219 Gastro-esophageal reflux disease without esophagitis: Secondary | ICD-10-CM | POA: Diagnosis not present

## 2020-12-04 DIAGNOSIS — Z800 Family history of malignant neoplasm of digestive organs: Secondary | ICD-10-CM | POA: Diagnosis not present

## 2020-12-04 DIAGNOSIS — K52831 Collagenous colitis: Secondary | ICD-10-CM | POA: Diagnosis not present

## 2021-01-22 DIAGNOSIS — H353211 Exudative age-related macular degeneration, right eye, with active choroidal neovascularization: Secondary | ICD-10-CM | POA: Diagnosis not present

## 2021-01-22 DIAGNOSIS — H353231 Exudative age-related macular degeneration, bilateral, with active choroidal neovascularization: Secondary | ICD-10-CM | POA: Diagnosis not present

## 2021-01-22 DIAGNOSIS — H353221 Exudative age-related macular degeneration, left eye, with active choroidal neovascularization: Secondary | ICD-10-CM | POA: Diagnosis not present

## 2021-03-04 DIAGNOSIS — H353221 Exudative age-related macular degeneration, left eye, with active choroidal neovascularization: Secondary | ICD-10-CM | POA: Diagnosis not present

## 2021-03-04 DIAGNOSIS — H353211 Exudative age-related macular degeneration, right eye, with active choroidal neovascularization: Secondary | ICD-10-CM | POA: Diagnosis not present

## 2021-03-04 DIAGNOSIS — H353231 Exudative age-related macular degeneration, bilateral, with active choroidal neovascularization: Secondary | ICD-10-CM | POA: Diagnosis not present

## 2021-03-28 DIAGNOSIS — G2581 Restless legs syndrome: Secondary | ICD-10-CM | POA: Diagnosis not present

## 2021-03-28 DIAGNOSIS — R404 Transient alteration of awareness: Secondary | ICD-10-CM | POA: Diagnosis not present

## 2021-03-28 DIAGNOSIS — R4189 Other symptoms and signs involving cognitive functions and awareness: Secondary | ICD-10-CM | POA: Diagnosis not present

## 2021-04-16 DIAGNOSIS — H353231 Exudative age-related macular degeneration, bilateral, with active choroidal neovascularization: Secondary | ICD-10-CM | POA: Diagnosis not present

## 2021-04-16 DIAGNOSIS — H353211 Exudative age-related macular degeneration, right eye, with active choroidal neovascularization: Secondary | ICD-10-CM | POA: Diagnosis not present

## 2021-04-16 DIAGNOSIS — H353221 Exudative age-related macular degeneration, left eye, with active choroidal neovascularization: Secondary | ICD-10-CM | POA: Diagnosis not present

## 2021-05-23 ENCOUNTER — Other Ambulatory Visit: Payer: Self-pay | Admitting: Family Medicine

## 2021-05-23 DIAGNOSIS — E876 Hypokalemia: Secondary | ICD-10-CM

## 2021-05-28 DIAGNOSIS — H353221 Exudative age-related macular degeneration, left eye, with active choroidal neovascularization: Secondary | ICD-10-CM | POA: Diagnosis not present

## 2021-05-28 DIAGNOSIS — H353211 Exudative age-related macular degeneration, right eye, with active choroidal neovascularization: Secondary | ICD-10-CM | POA: Diagnosis not present

## 2021-05-28 DIAGNOSIS — H353231 Exudative age-related macular degeneration, bilateral, with active choroidal neovascularization: Secondary | ICD-10-CM | POA: Diagnosis not present

## 2021-06-03 ENCOUNTER — Other Ambulatory Visit: Payer: Self-pay

## 2021-06-03 ENCOUNTER — Encounter: Payer: Self-pay | Admitting: Family Medicine

## 2021-06-03 ENCOUNTER — Ambulatory Visit (INDEPENDENT_AMBULATORY_CARE_PROVIDER_SITE_OTHER): Payer: Medicare HMO | Admitting: Family Medicine

## 2021-06-03 VITALS — BP 120/60 | HR 64 | Ht 62.5 in | Wt 143.0 lb

## 2021-06-03 DIAGNOSIS — J301 Allergic rhinitis due to pollen: Secondary | ICD-10-CM | POA: Diagnosis not present

## 2021-06-03 DIAGNOSIS — E782 Mixed hyperlipidemia: Secondary | ICD-10-CM | POA: Diagnosis not present

## 2021-06-03 DIAGNOSIS — E039 Hypothyroidism, unspecified: Secondary | ICD-10-CM | POA: Diagnosis not present

## 2021-06-03 DIAGNOSIS — E876 Hypokalemia: Secondary | ICD-10-CM

## 2021-06-03 DIAGNOSIS — M8000XS Age-related osteoporosis with current pathological fracture, unspecified site, sequela: Secondary | ICD-10-CM | POA: Diagnosis not present

## 2021-06-03 DIAGNOSIS — F5101 Primary insomnia: Secondary | ICD-10-CM | POA: Diagnosis not present

## 2021-06-03 DIAGNOSIS — K219 Gastro-esophageal reflux disease without esophagitis: Secondary | ICD-10-CM

## 2021-06-03 MED ORDER — MONTELUKAST SODIUM 10 MG PO TABS: 10.0000 mg | ORAL_TABLET | Freq: Every day | ORAL | 1 refills | Status: DC

## 2021-06-03 MED ORDER — LEVOTHYROXINE SODIUM 112 MCG PO TABS: 112.0000 ug | ORAL_TABLET | Freq: Every day | ORAL | 1 refills | Status: DC

## 2021-06-03 MED ORDER — PANTOPRAZOLE SODIUM 40 MG PO TBEC: 40.0000 mg | DELAYED_RELEASE_TABLET | Freq: Every day | ORAL | 1 refills | Status: DC

## 2021-06-03 MED ORDER — ALENDRONATE SODIUM 70 MG PO TABS: ORAL_TABLET | ORAL | 1 refills | Status: DC

## 2021-06-03 MED ORDER — POTASSIUM CHLORIDE ER 10 MEQ PO TBCR: EXTENDED_RELEASE_TABLET | ORAL | 1 refills | Status: DC

## 2021-06-03 MED ORDER — TRAZODONE HCL 100 MG PO TABS: 100.0000 mg | ORAL_TABLET | Freq: Every day | ORAL | 1 refills | Status: DC

## 2021-06-03 NOTE — Progress Notes (Signed)
Date:  06/03/2021   Name:  Brooke Juarez   DOB:  1951/04/12   MRN:  [id number]   Chief Complaint: Insomnia, Gastroesophageal Reflux, hypokalemia, Hypothyroidism, Osteoporosis, and Allergic Rhinitis   Insomnia The current episode started more than one year. The onset quality is gradual. The problem occurs intermittently. The problem has been waxing and waning since onset. Past treatments include medication.  Gastroesophageal Reflux She reports no abdominal pain, no chest pain, no dysphagia, no heartburn or no hoarse voice. This is a chronic problem. The current episode started more than 1 year ago. The problem has been gradually improving. The symptoms are aggravated by certain foods. Associated symptoms include fatigue. Pertinent negatives include no weight loss. She has tried a PPI for the symptoms. The treatment provided mild relief.  Thyroid Problem Presents for follow-up visit. Symptoms include fatigue and hair loss. Patient reports no anxiety, cold intolerance, constipation, depressed mood, diaphoresis, diarrhea, dry skin, heat intolerance, hoarse voice, leg swelling, menstrual problem, nail problem, palpitations, tremors, visual change, weight gain or weight loss. The symptoms have been stable.  URI  Chronicity: for allergic rhinitis. The problem has been gradually improving. There has been no fever. Associated symptoms include congestion, headaches and rhinorrhea. Pertinent negatives include no abdominal pain, chest pain or diarrhea. Treatments tried: singulair. The treatment provided moderate relief.   Lab Results  Component Value Date   NA 141 06/15/2020   K 4.4 06/15/2020   CO2 24 06/15/2020   GLUCOSE 95 06/15/2020   BUN 5 (L) 06/15/2020   CREATININE 0.87 06/15/2020   CALCIUM 9.8 06/15/2020   GFRNONAA 67 06/15/2020   Lab Results  Component Value Date   CHOL 215 (H) 06/15/2020   HDL 44 06/15/2020   LDLCALC 149 (H) 06/15/2020   TRIG 123 06/15/2020   CHOLHDL 3.6 06/16/2017    Lab Results  Component Value Date   TSH 2.900 11/30/2020   No results found for: HGBA1C Lab Results  Component Value Date   WBC 10.0 04/04/2018   HGB 12.5 04/04/2018   HCT 39.7 04/04/2018   MCV 102.6 (H) 04/04/2018   PLT 152 04/04/2018   Lab Results  Component Value Date   ALT 9 06/15/2020   AST 16 06/15/2020   ALKPHOS 167 (H) 06/15/2020   BILITOT 0.6 06/15/2020   No results found for: 25OHVITD2, 25OHVITD3, VD25OH   Review of Systems  Constitutional:  Positive for fatigue. Negative for diaphoresis, weight gain and weight loss.  HENT:  Positive for congestion and rhinorrhea. Negative for hoarse voice.   Cardiovascular:  Negative for chest pain and palpitations.  Gastrointestinal:  Negative for abdominal pain, constipation, diarrhea, dysphagia and heartburn.  Endocrine: Negative for cold intolerance and heat intolerance.  Genitourinary:  Negative for menstrual problem.  Neurological:  Positive for headaches. Negative for tremors.  Psychiatric/Behavioral:  The patient has insomnia. The patient is not nervous/anxious.    Patient Active Problem List   Diagnosis Date Noted   Mixed hyperlipidemia 06/07/2019   Irreducible right femoral hernia 04/28/2018   Limited mobility with chronic pain & MS 04/05/2018   Tobacco abuse 04/05/2018   MS (multiple sclerosis) ([place])    Hypokalemia 11/25/2017   Seasonal allergic rhinitis due to pollen 12/08/2016   CAD (coronary artery disease) 11/27/2014   Medicare annual wellness visit, subsequent 11/27/2014   Vitamin D deficiency 11/22/2014   Familial multiple lipoprotein-type hyperlipidemia 09/29/2014   Clinical depression 09/29/2014   Acid reflux 09/29/2014   Routine general medical examination at a  health care facility 09/29/2014   Restless leg 09/29/2014   DS (disseminated sclerosis) ([place]) 09/29/2014   Nicotine addiction 09/29/2014   OP (osteoporosis) 09/29/2014   Adult hypothyroidism 10/05/2013   Collagenous colitis 10/05/2013    Hypothyroid 10/05/2013    Allergies  Allergen Reactions   Codeine Nausea And Vomiting    Past Surgical History:  Procedure Laterality Date   APPENDECTOMY Left 04/14/2018   Procedure: APPENDECTOMY;  Surgeon: Olean Ree, MD;  Location: [facility];  Service: General;  Laterality: Left;   CATARACT EXTRACTION W/PHACO Right 10/30/2014   Procedure: CATARACT EXTRACTION PHACO AND INTRAOCULAR LENS PLACEMENT ([place]);  Surgeon: Estill Cotta, MD;  Location: [facility];  Service: Ophthalmology;  Laterality: Right;  [place]  1:55.7    CATARACT EXTRACTION W/PHACO Left 06/18/2015   Procedure: CATARACT EXTRACTION PHACO AND INTRAOCULAR LENS PLACEMENT ([facility]);  Surgeon: Estill Cotta, MD;  Location: [facility];  Service: Ophthalmology;  Laterality: Left;  [place] 01:36 AP% 26.1 CDE 42.19 fluid pack lo0t # [id number] H   COLONOSCOPY  2014   normal- Rein- repeat in 5 years   COLONOSCOPY WITH PROPOFOL N/A 07/09/2020   Procedure: COLONOSCOPY WITH PROPOFOL;  Surgeon: Toledo, Benay Pike, MD;  Location: [facility];  Service: Gastroenterology;  Laterality: N/A;   EYE SURGERY     FOOT SURGERY     INGUINAL HERNIA REPAIR Right 04/14/2018   Procedure: HERNIA REPAIR INGUINAL;  Surgeon: Olean Ree, MD;  Location: [facility];  Service: General;  Laterality: Right;   NASAL SINUS SURGERY     TONSILLECTOMY     TUBAL LIGATION     VAGINAL HYSTERECTOMY      Social History   Tobacco Use   Smoking status: Every Day    Packs/day: 0.75    Years: 55.00    Pack years: 41.25    Types: Cigarettes   Smokeless tobacco: Never  Vaping Use   Vaping Use: Never used  Substance Use Topics   Alcohol use: No    Alcohol/week: 0.0 standard drinks   Drug use: No     Medication list has been reviewed and updated.  Current Meds  Medication Sig   acetaminophen (TYLENOL) 500 MG tablet Take 1,000 mg by mouth 2 (two) times daily as needed for moderate pain or headache.   alendronate (FOSAMAX) 70 MG tablet TAKE 1 TABLET BY MOUTH ONCE A  WEEK ON AN EMPTY STOMACH WITH  A  FULL  GLASS  OF  WATER   cholecalciferol (VITAMIN D3) 10 MCG (400 UNIT) TABS tablet Take 400 Units by mouth.   gabapentin (NEURONTIN) 300 MG capsule Take 1 capsule by mouth 4 (four) times daily. [name]   levothyroxine (EUTHYROX) 112 MCG tablet Take 1 tablet (112 mcg total) by mouth daily.   montelukast (SINGULAIR) 10 MG tablet Take 1 tablet (10 mg total) by mouth at bedtime.   Multiple Vitamins-Minerals (PRESERVISION AREDS 2 PO) Take 2 capsules by mouth daily.   Omega-3 1000 MG CAPS Take 1,000 mg by mouth daily.   pantoprazole (PROTONIX) 40 MG tablet Take 1 tablet (40 mg total) by mouth daily.   potassium chloride (KLOR-CON) 10 MEQ tablet TAKE 1 TABLET BY MOUTH 3 TIMES PER WEEK   rOPINIRole (REQUIP) 1 MG tablet Take 1.5 tablets by mouth every evening. neuro   sulfaSALAzine (AZULFIDINE) 500 MG EC tablet Take 1 tablet by mouth 2 (two) times daily before a meal.   traZODone (DESYREL) 100 MG tablet Take 1 tablet (100 mg total) by mouth daily.   vitamin B-12 (CYANOCOBALAMIN)  500 MCG tablet Take 500 mcg by mouth every 14 (fourteen) days.     PHQ 2/9 Scores 06/03/2021 11/30/2020 11/07/2020 06/15/2020  PHQ - 2 Score 0 0 0 0  PHQ- 9 Score 0 0 - 0    GAD 7 : Generalized Anxiety Score 06/03/2021 11/30/2020 06/15/2020 12/07/2019  Nervous, Anxious, on Edge 0 0 0 0  Control/stop worrying 0 0 0 0  Worry too much - different things 0 0 0 0  Trouble relaxing 0 0 0 0  Restless 0 0 0 0  Easily annoyed or irritable 0 0 0 0  Afraid - awful might happen 0 0 0 0  Total GAD 7 Score 0 0 0 0  Anxiety Difficulty Not difficult at all - - -    BP Readings from Last 3 Encounters:  06/03/21 120/60  11/30/20 120/70  07/09/20 135/73    Physical Exam Vitals and nursing note reviewed.  Constitutional:      Appearance: She is well-developed.  HENT:     Head: Normocephalic.     Right Ear: Tympanic membrane, ear canal and external ear normal.     Left Ear: Tympanic membrane, ear canal  and external ear normal.     Nose: Nose normal. No congestion.  Eyes:     General: Lids are everted, no foreign bodies appreciated. No scleral icterus.       Left eye: No foreign body or hordeolum.     Conjunctiva/sclera: Conjunctivae normal.     Right eye: Right conjunctiva is not injected.     Left eye: Left conjunctiva is not injected.     Pupils: Pupils are equal, round, and reactive to light.  Neck:     Thyroid: No thyromegaly.     Vascular: No JVD.     Trachea: No tracheal deviation.  Cardiovascular:     Rate and Rhythm: Normal rate and regular rhythm.     Heart sounds: Normal heart sounds. No murmur heard.   No friction rub. No gallop.  Pulmonary:     Effort: Pulmonary effort is normal. No respiratory distress.     Breath sounds: Normal breath sounds. No wheezing, rhonchi or rales.  Abdominal:     General: Bowel sounds are normal.     Palpations: Abdomen is soft. There is no mass.     Tenderness: There is no abdominal tenderness. There is no guarding or rebound.  Musculoskeletal:        General: No tenderness. Normal range of motion.     Cervical back: Normal range of motion and neck supple.  Lymphadenopathy:     Cervical: No cervical adenopathy.  Skin:    General: Skin is warm.     Findings: No rash.  Neurological:     Mental Status: She is alert and oriented to person, place, and time.     Cranial Nerves: No cranial nerve deficit.     Deep Tendon Reflexes: Reflexes normal.  Psychiatric:        Mood and Affect: Mood is not anxious or depressed.    Wt Readings from Last 3 Encounters:  06/03/21 143 lb (64.9 kg)  11/30/20 144 lb (65.3 kg)  07/09/20 144 lb (65.3 kg)    BP 120/60    Pulse 64    Ht 5' 2.5" (1.588 m)    Wt 143 lb (64.9 kg)    LMP  (LMP Unknown) Comment: had hysterectomy   BMI 25.74 kg/m   Assessment and Plan:  1. Adult hypothyroidism Chronic.  Controlled.  Stable.  Will check TSH and pending we will likely continue present dosing of levothyroxine  at 112 mcg daily - levothyroxine (EUTHYROX) 112 MCG tablet; Take 1 tablet (112 mcg total) by mouth daily.  Dispense: 90 tablet; Refill: 1 - TSH  2. Gastroesophageal reflux disease, unspecified whether esophagitis present Chronic.  Controlled.  Stable.  Continue pantoprazole 40 mg once a day. - pantoprazole (PROTONIX) 40 MG tablet; Take 1 tablet (40 mg total) by mouth daily.  Dispense: 90 tablet; Refill: 1  3. Age-related osteoporosis with current pathological fracture, sequela Chronic.  Controlled.  Stable.  Given her risk of falls we will continue Fosamax 70 mg once a week. - alendronate (FOSAMAX) 70 MG tablet; TAKE 1 TABLET BY MOUTH ONCE A WEEK ON AN EMPTY STOMACH WITH  A  FULL  GLASS  OF  WATER  Dispense: 12 tablet; Refill: 1  4. Seasonal allergic rhinitis due to pollen Chronic.  Controlled.  Stable.  Except for occasional sinus headaches patient is doing well with continuance of Singulair 10 mg once a day. - montelukast (SINGULAIR) 10 MG tablet; Take 1 tablet (10 mg total) by mouth at bedtime.  Dispense: 90 tablet; Refill: 1  5. Hypokalemia Chronic.  Controlled.  Stable.  We will check current potassium level and likely continue at 10 mEq KCl 3 times per week. - potassium chloride (KLOR-CON) 10 MEQ tablet; TAKE 1 TABLET BY MOUTH 3 TIMES PER WEEK  Dispense: 36 tablet; Refill: 1 - Comprehensive Metabolic Panel (CMET)  6. Mixed hyperlipidemia Chronic.  Controlled.  Stable.  Continue dietary control with lipid panel pending. - Lipid Panel With LDL/HDL Ratio  7. Primary insomnia Chronic.  Controlled.  Stable.  Continue trazodone 100 mg 1 tablet once a day. - traZODone (DESYREL) 100 MG tablet; Take 1 tablet (100 mg total) by mouth daily.  Dispense: 90 tablet; Refill: 1

## 2021-06-04 LAB — LIPID PANEL WITH LDL/HDL RATIO
Cholesterol, Total: 187 mg/dL (ref 100–199)
HDL: 40 mg/dL (ref >39)
LDL Chol Calc (NIH): 128 mg/dL — ABNORMAL HIGH (ref 0–99)
LDL/HDL Ratio: 3.2 ratio (ref 0.0–3.2)
Triglycerides: 106 mg/dL (ref 0–149)
VLDL Cholesterol Cal: 19 mg/dL (ref 5–40)

## 2021-06-04 LAB — COMPREHENSIVE METABOLIC PANEL
ALT: 6 IU/L (ref 0–32)
AST: 16 IU/L (ref 0–40)
Albumin/Globulin Ratio: 2.4 — ABNORMAL HIGH (ref 1.2–2.2)
Albumin: 4.1 g/dL (ref 3.7–4.7)
Alkaline Phosphatase: 144 IU/L — ABNORMAL HIGH (ref 44–121)
BUN/Creatinine Ratio: 13 (ref 12–28)
BUN: 9 mg/dL (ref 8–27)
Bilirubin Total: 0.6 mg/dL (ref 0.0–1.2)
CO2: 25 mmol/L (ref 20–29)
Calcium: 9.3 mg/dL (ref 8.7–10.3)
Chloride: 107 mmol/L — ABNORMAL HIGH (ref 96–106)
Creatinine, Ser: 0.68 mg/dL (ref 0.57–1.00)
Globulin, Total: 1.7 g/dL (ref 1.5–4.5)
Glucose: 88 mg/dL (ref 70–99)
Potassium: 4.7 mmol/L (ref 3.5–5.2)
Sodium: 145 mmol/L — ABNORMAL HIGH (ref 134–144)
Total Protein: 5.8 g/dL — ABNORMAL LOW (ref 6.0–8.5)
eGFR: 92 mL/min/{1.73_m2} (ref >59)

## 2021-06-04 LAB — TSH: TSH: 2.75 u[IU]/mL (ref 0.450–4.500)

## 2021-07-15 DIAGNOSIS — G8929 Other chronic pain: Secondary | ICD-10-CM | POA: Diagnosis not present

## 2021-07-15 DIAGNOSIS — G894 Chronic pain syndrome: Secondary | ICD-10-CM | POA: Diagnosis not present

## 2021-07-15 DIAGNOSIS — G6289 Other specified polyneuropathies: Secondary | ICD-10-CM | POA: Diagnosis not present

## 2021-07-15 DIAGNOSIS — G2581 Restless legs syndrome: Secondary | ICD-10-CM | POA: Diagnosis not present

## 2021-07-15 DIAGNOSIS — G4089 Other seizures: Secondary | ICD-10-CM | POA: Diagnosis not present

## 2021-07-15 DIAGNOSIS — M791 Myalgia, unspecified site: Secondary | ICD-10-CM | POA: Diagnosis not present

## 2021-07-15 DIAGNOSIS — K219 Gastro-esophageal reflux disease without esophagitis: Secondary | ICD-10-CM | POA: Diagnosis not present

## 2021-07-15 DIAGNOSIS — G35 Multiple sclerosis: Secondary | ICD-10-CM | POA: Diagnosis not present

## 2021-07-15 DIAGNOSIS — M5440 Lumbago with sciatica, unspecified side: Secondary | ICD-10-CM | POA: Diagnosis not present

## 2021-07-15 DIAGNOSIS — M545 Low back pain, unspecified: Secondary | ICD-10-CM | POA: Diagnosis not present

## 2021-07-23 DIAGNOSIS — H353211 Exudative age-related macular degeneration, right eye, with active choroidal neovascularization: Secondary | ICD-10-CM | POA: Diagnosis not present

## 2021-07-23 DIAGNOSIS — H353221 Exudative age-related macular degeneration, left eye, with active choroidal neovascularization: Secondary | ICD-10-CM | POA: Diagnosis not present

## 2021-07-23 DIAGNOSIS — H353231 Exudative age-related macular degeneration, bilateral, with active choroidal neovascularization: Secondary | ICD-10-CM | POA: Diagnosis not present

## 2021-08-14 DIAGNOSIS — G2581 Restless legs syndrome: Secondary | ICD-10-CM | POA: Diagnosis not present

## 2021-08-14 DIAGNOSIS — K219 Gastro-esophageal reflux disease without esophagitis: Secondary | ICD-10-CM | POA: Diagnosis not present

## 2021-08-14 DIAGNOSIS — G8929 Other chronic pain: Secondary | ICD-10-CM | POA: Diagnosis not present

## 2021-08-14 DIAGNOSIS — G35 Multiple sclerosis: Secondary | ICD-10-CM | POA: Diagnosis not present

## 2021-08-14 DIAGNOSIS — M1990 Unspecified osteoarthritis, unspecified site: Secondary | ICD-10-CM | POA: Diagnosis not present

## 2021-08-14 DIAGNOSIS — G6289 Other specified polyneuropathies: Secondary | ICD-10-CM | POA: Diagnosis not present

## 2021-08-14 DIAGNOSIS — G4089 Other seizures: Secondary | ICD-10-CM | POA: Diagnosis not present

## 2021-08-14 DIAGNOSIS — M542 Cervicalgia: Secondary | ICD-10-CM | POA: Diagnosis not present

## 2021-08-14 DIAGNOSIS — G894 Chronic pain syndrome: Secondary | ICD-10-CM | POA: Diagnosis not present

## 2021-08-14 DIAGNOSIS — F1120 Opioid dependence, uncomplicated: Secondary | ICD-10-CM | POA: Diagnosis not present

## 2021-09-02 DIAGNOSIS — M542 Cervicalgia: Secondary | ICD-10-CM | POA: Diagnosis not present

## 2021-09-02 DIAGNOSIS — G35 Multiple sclerosis: Secondary | ICD-10-CM | POA: Diagnosis not present

## 2021-09-02 DIAGNOSIS — Z79899 Other long term (current) drug therapy: Secondary | ICD-10-CM | POA: Diagnosis not present

## 2021-09-02 DIAGNOSIS — G894 Chronic pain syndrome: Secondary | ICD-10-CM | POA: Diagnosis not present

## 2021-09-02 DIAGNOSIS — K219 Gastro-esophageal reflux disease without esophagitis: Secondary | ICD-10-CM | POA: Diagnosis not present

## 2021-09-02 DIAGNOSIS — G2581 Restless legs syndrome: Secondary | ICD-10-CM | POA: Diagnosis not present

## 2021-09-02 DIAGNOSIS — G4089 Other seizures: Secondary | ICD-10-CM | POA: Diagnosis not present

## 2021-09-02 DIAGNOSIS — M1990 Unspecified osteoarthritis, unspecified site: Secondary | ICD-10-CM | POA: Diagnosis not present

## 2021-09-02 DIAGNOSIS — G8929 Other chronic pain: Secondary | ICD-10-CM | POA: Diagnosis not present

## 2021-09-02 DIAGNOSIS — F1110 Opioid abuse, uncomplicated: Secondary | ICD-10-CM | POA: Diagnosis not present

## 2021-09-02 DIAGNOSIS — G6289 Other specified polyneuropathies: Secondary | ICD-10-CM | POA: Diagnosis not present

## 2021-09-17 DIAGNOSIS — H353211 Exudative age-related macular degeneration, right eye, with active choroidal neovascularization: Secondary | ICD-10-CM | POA: Diagnosis not present

## 2021-09-17 DIAGNOSIS — H353231 Exudative age-related macular degeneration, bilateral, with active choroidal neovascularization: Secondary | ICD-10-CM | POA: Diagnosis not present

## 2021-09-17 DIAGNOSIS — H353221 Exudative age-related macular degeneration, left eye, with active choroidal neovascularization: Secondary | ICD-10-CM | POA: Diagnosis not present

## 2021-09-24 DIAGNOSIS — F458 Other somatoform disorders: Secondary | ICD-10-CM | POA: Diagnosis not present

## 2021-09-24 DIAGNOSIS — R2689 Other abnormalities of gait and mobility: Secondary | ICD-10-CM | POA: Diagnosis not present

## 2021-09-24 DIAGNOSIS — G629 Polyneuropathy, unspecified: Secondary | ICD-10-CM | POA: Diagnosis not present

## 2021-09-24 DIAGNOSIS — M792 Neuralgia and neuritis, unspecified: Secondary | ICD-10-CM | POA: Diagnosis not present

## 2021-09-24 DIAGNOSIS — R404 Transient alteration of awareness: Secondary | ICD-10-CM | POA: Diagnosis not present

## 2021-09-24 DIAGNOSIS — Z79899 Other long term (current) drug therapy: Secondary | ICD-10-CM | POA: Diagnosis not present

## 2021-09-24 DIAGNOSIS — G2581 Restless legs syndrome: Secondary | ICD-10-CM | POA: Diagnosis not present

## 2021-09-24 DIAGNOSIS — G8929 Other chronic pain: Secondary | ICD-10-CM | POA: Diagnosis not present

## 2021-09-24 DIAGNOSIS — R4189 Other symptoms and signs involving cognitive functions and awareness: Secondary | ICD-10-CM | POA: Diagnosis not present

## 2021-09-24 DIAGNOSIS — R2990 Unspecified symptoms and signs involving the nervous system: Secondary | ICD-10-CM | POA: Diagnosis not present

## 2021-09-26 ENCOUNTER — Other Ambulatory Visit: Payer: Self-pay | Admitting: Student

## 2021-09-26 DIAGNOSIS — G2581 Restless legs syndrome: Secondary | ICD-10-CM

## 2021-09-26 DIAGNOSIS — R404 Transient alteration of awareness: Secondary | ICD-10-CM

## 2021-10-06 ENCOUNTER — Ambulatory Visit
Admission: RE | Admit: 2021-10-06 | Discharge: 2021-10-06 | Disposition: A | Discharge status: Home / Self Care | Payer: Medicare HMO | Source: Ambulatory Visit | Attending: Student | Admitting: Student

## 2021-10-06 DIAGNOSIS — G2581 Restless legs syndrome: Secondary | ICD-10-CM

## 2021-10-06 DIAGNOSIS — R4020 Unspecified coma: Secondary | ICD-10-CM | POA: Diagnosis not present

## 2021-10-06 DIAGNOSIS — M50223 Other cervical disc displacement at C6-C7 level: Secondary | ICD-10-CM | POA: Diagnosis not present

## 2021-10-06 DIAGNOSIS — R451 Restlessness and agitation: Secondary | ICD-10-CM | POA: Diagnosis not present

## 2021-10-06 DIAGNOSIS — R404 Transient alteration of awareness: Secondary | ICD-10-CM

## 2021-10-06 DIAGNOSIS — G35 Multiple sclerosis: Secondary | ICD-10-CM | POA: Diagnosis not present

## 2021-10-06 DIAGNOSIS — M4802 Spinal stenosis, cervical region: Secondary | ICD-10-CM | POA: Diagnosis not present

## 2021-10-06 DIAGNOSIS — M50221 Other cervical disc displacement at C4-C5 level: Secondary | ICD-10-CM | POA: Diagnosis not present

## 2021-10-07 DIAGNOSIS — M791 Myalgia, unspecified site: Secondary | ICD-10-CM | POA: Diagnosis not present

## 2021-10-07 DIAGNOSIS — Z79899 Other long term (current) drug therapy: Secondary | ICD-10-CM | POA: Diagnosis not present

## 2021-10-07 DIAGNOSIS — G6289 Other specified polyneuropathies: Secondary | ICD-10-CM | POA: Diagnosis not present

## 2021-10-07 DIAGNOSIS — G894 Chronic pain syndrome: Secondary | ICD-10-CM | POA: Diagnosis not present

## 2021-10-07 DIAGNOSIS — G4089 Other seizures: Secondary | ICD-10-CM | POA: Diagnosis not present

## 2021-10-07 DIAGNOSIS — G8929 Other chronic pain: Secondary | ICD-10-CM | POA: Diagnosis not present

## 2021-10-07 DIAGNOSIS — K219 Gastro-esophageal reflux disease without esophagitis: Secondary | ICD-10-CM | POA: Diagnosis not present

## 2021-10-07 DIAGNOSIS — G35 Multiple sclerosis: Secondary | ICD-10-CM | POA: Diagnosis not present

## 2021-10-07 DIAGNOSIS — G2581 Restless legs syndrome: Secondary | ICD-10-CM | POA: Diagnosis not present

## 2021-10-07 DIAGNOSIS — M1990 Unspecified osteoarthritis, unspecified site: Secondary | ICD-10-CM | POA: Diagnosis not present

## 2021-11-04 DIAGNOSIS — Z720 Tobacco use: Secondary | ICD-10-CM | POA: Diagnosis not present

## 2021-11-04 DIAGNOSIS — G894 Chronic pain syndrome: Secondary | ICD-10-CM | POA: Diagnosis not present

## 2021-11-04 DIAGNOSIS — G6289 Other specified polyneuropathies: Secondary | ICD-10-CM | POA: Diagnosis not present

## 2021-11-04 DIAGNOSIS — Z79899 Other long term (current) drug therapy: Secondary | ICD-10-CM | POA: Diagnosis not present

## 2021-11-04 DIAGNOSIS — K219 Gastro-esophageal reflux disease without esophagitis: Secondary | ICD-10-CM | POA: Diagnosis not present

## 2021-11-04 DIAGNOSIS — G8929 Other chronic pain: Secondary | ICD-10-CM | POA: Diagnosis not present

## 2021-11-04 DIAGNOSIS — G2581 Restless legs syndrome: Secondary | ICD-10-CM | POA: Diagnosis not present

## 2021-11-04 DIAGNOSIS — Z716 Tobacco abuse counseling: Secondary | ICD-10-CM | POA: Diagnosis not present

## 2021-11-04 DIAGNOSIS — M1990 Unspecified osteoarthritis, unspecified site: Secondary | ICD-10-CM | POA: Diagnosis not present

## 2021-11-04 DIAGNOSIS — G35 Multiple sclerosis: Secondary | ICD-10-CM | POA: Diagnosis not present

## 2021-11-11 ENCOUNTER — Ambulatory Visit (INDEPENDENT_AMBULATORY_CARE_PROVIDER_SITE_OTHER): Payer: Medicare HMO

## 2021-11-11 DIAGNOSIS — Z0000 Encounter for general adult medical examination without abnormal findings: Secondary | ICD-10-CM | POA: Diagnosis not present

## 2021-11-25 DIAGNOSIS — H353221 Exudative age-related macular degeneration, left eye, with active choroidal neovascularization: Secondary | ICD-10-CM | POA: Diagnosis not present

## 2021-11-25 DIAGNOSIS — H353211 Exudative age-related macular degeneration, right eye, with active choroidal neovascularization: Secondary | ICD-10-CM | POA: Diagnosis not present

## 2021-12-02 ENCOUNTER — Encounter: Payer: Self-pay | Admitting: Family Medicine

## 2021-12-02 ENCOUNTER — Ambulatory Visit (INDEPENDENT_AMBULATORY_CARE_PROVIDER_SITE_OTHER): Payer: Medicare HMO | Admitting: Family Medicine

## 2021-12-02 VITALS — BP 120/80 | HR 80 | Ht 62.5 in | Wt 143.0 lb

## 2021-12-02 DIAGNOSIS — J301 Allergic rhinitis due to pollen: Secondary | ICD-10-CM | POA: Diagnosis not present

## 2021-12-02 DIAGNOSIS — K219 Gastro-esophageal reflux disease without esophagitis: Secondary | ICD-10-CM | POA: Diagnosis not present

## 2021-12-02 DIAGNOSIS — F5101 Primary insomnia: Secondary | ICD-10-CM

## 2021-12-02 DIAGNOSIS — E039 Hypothyroidism, unspecified: Secondary | ICD-10-CM

## 2021-12-02 DIAGNOSIS — E876 Hypokalemia: Secondary | ICD-10-CM | POA: Diagnosis not present

## 2021-12-02 DIAGNOSIS — M8000XS Age-related osteoporosis with current pathological fracture, unspecified site, sequela: Secondary | ICD-10-CM

## 2021-12-02 MED ORDER — MONTELUKAST SODIUM 10 MG PO TABS: 10.0000 mg | ORAL_TABLET | Freq: Every day | ORAL | 1 refills | Status: DC

## 2021-12-02 MED ORDER — LEVOTHYROXINE SODIUM 112 MCG PO TABS: 112.0000 ug | ORAL_TABLET | Freq: Every day | ORAL | 1 refills | Status: DC

## 2021-12-02 MED ORDER — POTASSIUM CHLORIDE ER 10 MEQ PO TBCR: EXTENDED_RELEASE_TABLET | ORAL | 1 refills | Status: DC

## 2021-12-02 MED ORDER — PANTOPRAZOLE SODIUM 40 MG PO TBEC: 40.0000 mg | DELAYED_RELEASE_TABLET | Freq: Every day | ORAL | 1 refills | Status: DC

## 2021-12-02 MED ORDER — TRAZODONE HCL 100 MG PO TABS: 100.0000 mg | ORAL_TABLET | Freq: Every day | ORAL | 1 refills | Status: DC

## 2021-12-02 MED ORDER — ALENDRONATE SODIUM 70 MG PO TABS: ORAL_TABLET | ORAL | 1 refills | Status: DC

## 2021-12-02 NOTE — Progress Notes (Signed)
Date:  12/02/2021   Name:  Brooke Juarez   DOB:  [birth year]   MRN:  [id number]   Chief Complaint: Insomnia, hypokalemia, Gastroesophageal Reflux, Asthma, Hypothyroidism, and Osteoporosis  Insomnia Primary symptoms: no fragmented sleep, no sleep disturbance, no difficulty falling asleep, no somnolence, no frequent awakening, no premature morning awakening, no malaise/fatigue, no napping.   The current episode started more than one year. The onset quality is gradual. The problem occurs intermittently. The problem has been gradually improving since onset. Past treatments include medication. The treatment provided moderate relief. Prior diagnostic workup includes:  No prior workup.  Gastroesophageal Reflux She complains of heartburn. She reports no chest pain, no choking, no dysphagia or no wheezing. This is a chronic problem. The symptoms are aggravated by certain foods. Pertinent negatives include no anemia, fatigue, melena, muscle weakness, orthopnea or weight loss.  Asthma There is no chest tightness, frequent throat clearing, shortness of breath, sputum production or wheezing. This is a chronic problem. The problem has been gradually improving. Associated symptoms include heartburn. Pertinent negatives include no chest pain, malaise/fatigue, orthopnea or weight loss. Her past medical history is significant for asthma.  Thyroid Problem Presents for follow-up visit. Patient reports no constipation, diaphoresis, diarrhea, dry skin, fatigue, heat intolerance, palpitations, weight gain or weight loss.    Lab Results  Component Value Date   NA 145 (H) 06/03/2021   K 4.7 06/03/2021   CO2 25 06/03/2021   GLUCOSE 88 06/03/2021   BUN 9 06/03/2021   CREATININE 0.68 06/03/2021   CALCIUM 9.3 06/03/2021   EGFR 92 06/03/2021   GFRNONAA 67 06/15/2020   Lab Results  Component Value Date   CHOL 187 06/03/2021   HDL 40 06/03/2021   LDLCALC 128 (H) 06/03/2021   TRIG 106 06/03/2021   CHOLHDL 3.6  06/16/2017   Lab Results  Component Value Date   TSH 2.750 06/03/2021   No results found for: "HGBA1C" Lab Results  Component Value Date   WBC 10.0 04/04/2018   HGB 12.5 04/04/2018   HCT 39.7 04/04/2018   MCV 102.6 (H) 04/04/2018   PLT 152 04/04/2018   Lab Results  Component Value Date   ALT 6 06/03/2021   AST 16 06/03/2021   ALKPHOS 144 (H) 06/03/2021   BILITOT 0.6 06/03/2021   No results found for: "25OHVITD2", "25OHVITD3", "VD25OH"   Review of Systems  Constitutional:  Negative for diaphoresis, fatigue, malaise/fatigue, weight gain and weight loss.  Respiratory:  Negative for sputum production, choking, shortness of breath and wheezing.   Cardiovascular:  Negative for chest pain and palpitations.  Gastrointestinal:  Positive for heartburn. Negative for blood in stool, constipation, diarrhea, dysphagia and melena.  Endocrine: Negative for heat intolerance, polydipsia, polyphagia and polyuria.  Genitourinary:  Negative for frequency.  Musculoskeletal:  Negative for arthralgias and muscle weakness.  Psychiatric/Behavioral:  Negative for sleep disturbance. The patient has insomnia.     Patient Active Problem List   Diagnosis Date Noted   Mixed hyperlipidemia 06/07/2019   Irreducible right femoral hernia 04/28/2018   Limited mobility with chronic pain & MS 04/05/2018   Tobacco abuse 04/05/2018   MS (multiple sclerosis) ([place])    Hypokalemia 11/25/2017   Seasonal allergic rhinitis due to pollen 12/08/2016   CAD (coronary artery disease) 11/27/2014   Medicare annual wellness visit, subsequent 11/27/2014   Vitamin D deficiency 11/22/2014   Familial multiple lipoprotein-type hyperlipidemia 09/29/2014   Clinical depression 09/29/2014   Acid reflux 09/29/2014   Routine general medical  examination at a health care facility 09/29/2014   Restless leg 09/29/2014   DS (disseminated sclerosis) ([place]) 09/29/2014   Nicotine addiction 09/29/2014   OP (osteoporosis) 09/29/2014    Adult hypothyroidism 10/05/2013   Collagenous colitis 10/05/2013   Hypothyroid 10/05/2013    Allergies  Allergen Reactions   Codeine Nausea And Vomiting    Past Surgical History:  Procedure Laterality Date   APPENDECTOMY Left 04/14/2018   Procedure: APPENDECTOMY;  Surgeon: Olean Ree, MD;  Location: [facility] ORS;  Service: General;  Laterality: Left;   CATARACT EXTRACTION W/PHACO Right 10/30/2014   Procedure: CATARACT EXTRACTION PHACO AND INTRAOCULAR LENS PLACEMENT ([place]);  Surgeon: Estill Cotta, MD;  Location: [facility] OR[facility];  Service: Ophthalmology;  Laterality: Right;  [place]  1:55.7    CATARACT EXTRACTION W/PHACO Left 06/18/2015   Procedure: CATARACT EXTRACTION PHACO AND INTRAOCULAR LENS PLACEMENT ([facility]);  Surgeon: Estill Cotta, MD;  Location: [facility];  Service: Ophthalmology;  Laterality: Left;  [place] 01:36 AP% 26.1 CDE 42.19 fluid pack lo0t # [id number]   COLONOSCOPY  2014   normal- Rein- repeat in 5 years   COLONOSCOPY WITH PROPOFOL N/A 07/09/2020   Procedure: COLONOSCOPY WITH PROPOFOL;  Surgeon: Toledo, Benay Pike, MD;  Location: [facility];  Service: Gastroenterology;  Laterality: N/A;   EYE SURGERY     FOOT SURGERY     INGUINAL HERNIA REPAIR Right 04/14/2018   Procedure: HERNIA REPAIR INGUINAL;  Surgeon: Olean Ree, MD;  Location: [facility];  Service: General;  Laterality: Right;   NASAL SINUS SURGERY     TONSILLECTOMY     TUBAL LIGATION     VAGINAL HYSTERECTOMY      Social History   Tobacco Use   Smoking status: Every Day    Packs/day: 0.75    Years: 55.00    Total pack years: 41.25    Types: Cigarettes   Smokeless tobacco: Never  Vaping Use   Vaping Use: Never used  Substance Use Topics   Alcohol use: No    Alcohol/week: 0.0 standard drinks of alcohol   Drug use: No     Medication list has been reviewed and updated.  Current Meds  Medication Sig   acetaminophen (TYLENOL) 500 MG tablet Take 1,000 mg by mouth 2 (two) times daily as needed for moderate  pain or headache.   alendronate (FOSAMAX) 70 MG tablet TAKE 1 TABLET BY MOUTH ONCE A WEEK ON AN EMPTY STOMACH WITH  A  FULL  GLASS  OF  WATER   cholecalciferol (VITAMIN D3) 10 MCG (400 UNIT) TABS tablet Take 400 Units by mouth.   gabapentin (NEURONTIN) 300 MG capsule Take 1 capsule by mouth 4 (four) times daily. [name]   levothyroxine (EUTHYROX) 112 MCG tablet Take 1 tablet (112 mcg total) by mouth daily.   lidocaine (LIDODERM) 5 % SMARTSIG:Topical   methocarbamol (ROBAXIN) 500 MG tablet SMARTSIG:0.5 Tablet(s) By Mouth Every 12 Hours PRN   montelukast (SINGULAIR) 10 MG tablet Take 1 tablet (10 mg total) by mouth at bedtime.   Multiple Vitamins-Minerals (PRESERVISION AREDS 2 PO) Take 2 capsules by mouth daily.   Omega-3 Fatty Acids (FISH OIL) 1000 MG CAPS Take by mouth.   oxyCODONE-acetaminophen (PERCOCET/ROXICET) 5-325 MG tablet SMARTSIG:0.5 Tablet(s) By Mouth Every 12 Hours PRN   pantoprazole (PROTONIX) 40 MG tablet Take 1 tablet (40 mg total) by mouth daily.   potassium chloride (KLOR-CON) 10 MEQ tablet TAKE 1 TABLET BY MOUTH 3 TIMES PER WEEK   rOPINIRole (REQUIP) 1 MG tablet Take 2 tablets by mouth  at bedtime.   sulfaSALAzine (AZULFIDINE) 500 MG EC tablet Take 500 mg by mouth 2 (two) times daily.   traZODone (DESYREL) 100 MG tablet Take 1 tablet (100 mg total) by mouth daily.   vitamin B-12 (CYANOCOBALAMIN) 500 MCG tablet Take 500 mcg by mouth every 14 (fourteen) days.        12/02/2021    8:54 AM 06/03/2021    8:01 AM 11/30/2020    8:02 AM 06/15/2020    8:52 AM  GAD 7 : Generalized Anxiety Score  Nervous, Anxious, on Edge 0 0 0 0  Control/stop worrying 0 0 0 0  Worry too much - different things 0 0 0 0  Trouble relaxing 0 0 0 0  Restless 0 0 0 0  Easily annoyed or irritable 0 0 0 0  Afraid - awful might happen 0 0 0 0  Total GAD 7 Score 0 0 0 0  Anxiety Difficulty Not difficult at all Not difficult at all         12/02/2021    8:54 AM 11/11/2021    9:29 AM 06/03/2021    8:00 AM   Depression screen PHQ 2/9  Decreased Interest 0 0 0  Down, Depressed, Hopeless 1 0 0  PHQ - 2 Score 1 0 0  Altered sleeping 2  0  Tired, decreased energy 3  0  Change in appetite 0  0  Feeling bad or failure about yourself  0  0  Trouble concentrating 0  0  Moving slowly or fidgety/restless 3  0  Suicidal thoughts 0  0  PHQ-9 Score 9  0  Difficult doing work/chores Not difficult at all  Not difficult at all    BP Readings from Last 3 Encounters:  12/02/21 120/80  06/03/21 120/60  11/30/20 120/70    Physical Exam Vitals and nursing note reviewed. Exam conducted with a chaperone present.  Constitutional:      General: She is not in acute distress.    Appearance: She is not diaphoretic.  HENT:     Head: Normocephalic and atraumatic.     Right Ear: Tympanic membrane and external ear normal.     Left Ear: Tympanic membrane and external ear normal.     Nose: Nose normal.  Eyes:     General:        Right eye: No discharge.        Left eye: No discharge.     Conjunctiva/sclera: Conjunctivae normal.     Pupils: Pupils are equal, round, and reactive to light.  Neck:     Thyroid: No thyroid mass, thyromegaly or thyroid tenderness.     Vascular: No carotid bruit or JVD.     Trachea: Trachea normal.  Cardiovascular:     Rate and Rhythm: Normal rate and regular rhythm.     Heart sounds: Normal heart sounds. No murmur heard.    No friction rub. No gallop.  Pulmonary:     Effort: Pulmonary effort is normal.     Breath sounds: Normal breath sounds. No wheezing, rhonchi or rales.  Abdominal:     General: Bowel sounds are normal.     Palpations: Abdomen is soft. There is no mass.     Tenderness: There is no abdominal tenderness. There is no guarding.  Musculoskeletal:        General: Normal range of motion.     Cervical back: Normal range of motion and neck supple.  Lymphadenopathy:     Cervical: No  cervical adenopathy.     Right cervical: No superficial, deep or posterior  cervical adenopathy.    Left cervical: No superficial, deep or posterior cervical adenopathy.  Skin:    General: Skin is warm and dry.  Neurological:     Mental Status: She is alert.     Wt Readings from Last 3 Encounters:  12/02/21 143 lb (64.9 kg)  06/03/21 143 lb (64.9 kg)  11/30/20 144 lb (65.3 kg)    BP 120/80   Pulse 80   Ht 5' 2.5" (1.588 m)   Wt 143 lb (64.9 kg)   LMP  (LMP Unknown) Comment: had hysterectomy  BMI 25.74 kg/m   Assessment and Plan:  1. Adult hypothyroidism Chronic.  Controlled.  Stable.  Will check TSH and if the normal range we will continue levothyroxine 112 mcg daily. - levothyroxine (EUTHYROX) 112 MCG tablet; Take 1 tablet (112 mcg total) by mouth daily.  Dispense: 90 tablet; Refill: 1 - TSH  2. Age-related osteoporosis with current pathological fracture, sequela Chronic.  Controlled.  Stable.  Patient had extreme risk for falls and will continue Fosamax 70 mg once a week. - alendronate (FOSAMAX) 70 MG tablet; TAKE 1 TABLET BY MOUTH ONCE A WEEK ON AN EMPTY STOMACH WITH  A  FULL  GLASS  OF  WATER  Dispense: 12 tablet; Refill: 1  3. Seasonal allergic rhinitis due to pollen .  Controlled.  Stable.  Continue Singulair 10 mg once a day. - montelukast (SINGULAIR) 10 MG tablet; Take 1 tablet (10 mg total) by mouth at bedtime.  Dispense: 90 tablet; Refill: 1  4. Gastroesophageal reflux disease, unspecified whether esophagitis present Chronic.  Controlled.  Stable.  Tolerating Protonix 40 mg once a day.  With only occasional breakthrough heartburn with certain foods. - pantoprazole (PROTONIX) 40 MG tablet; Take 1 tablet (40 mg total) by mouth daily.  Dispense: 90 tablet; Refill: 1  5. Hypokalemia Patient at risk for low potassium and will continue current dosing of potassium chloride 10 mg daily. - potassium chloride (KLOR-CON) 10 MEQ tablet; TAKE 1 TABLET BY MOUTH 3 TIMES PER WEEK  Dispense: 36 tablet; Refill: 1  6. Primary insomnia Chronic.   Controlled.  Stable.  Trazodone 100 mg nightly is doing well with control of sleep pattern. - traZODone (DESYREL) 100 MG tablet; Take 1 tablet (100 mg total) by mouth daily.  Dispense: 90 tablet; Refill: 1

## 2021-12-09 ENCOUNTER — Other Ambulatory Visit: Payer: Self-pay

## 2021-12-09 DIAGNOSIS — G4089 Other seizures: Secondary | ICD-10-CM | POA: Diagnosis not present

## 2021-12-09 DIAGNOSIS — G8929 Other chronic pain: Secondary | ICD-10-CM | POA: Diagnosis not present

## 2021-12-09 DIAGNOSIS — E039 Hypothyroidism, unspecified: Secondary | ICD-10-CM | POA: Diagnosis not present

## 2021-12-09 DIAGNOSIS — G35 Multiple sclerosis: Secondary | ICD-10-CM | POA: Diagnosis not present

## 2021-12-09 DIAGNOSIS — M545 Low back pain, unspecified: Secondary | ICD-10-CM | POA: Diagnosis not present

## 2021-12-09 DIAGNOSIS — G2581 Restless legs syndrome: Secondary | ICD-10-CM | POA: Diagnosis not present

## 2021-12-09 DIAGNOSIS — K219 Gastro-esophageal reflux disease without esophagitis: Secondary | ICD-10-CM | POA: Diagnosis not present

## 2021-12-09 DIAGNOSIS — M791 Myalgia, unspecified site: Secondary | ICD-10-CM | POA: Diagnosis not present

## 2021-12-09 DIAGNOSIS — G894 Chronic pain syndrome: Secondary | ICD-10-CM | POA: Diagnosis not present

## 2021-12-09 DIAGNOSIS — Z79899 Other long term (current) drug therapy: Secondary | ICD-10-CM | POA: Diagnosis not present

## 2021-12-09 DIAGNOSIS — G6289 Other specified polyneuropathies: Secondary | ICD-10-CM | POA: Diagnosis not present

## 2021-12-09 DIAGNOSIS — Z716 Tobacco abuse counseling: Secondary | ICD-10-CM | POA: Diagnosis not present

## 2021-12-10 LAB — TSH: TSH: 0.473 u[IU]/mL (ref 0.450–4.500)

## 2022-01-06 DIAGNOSIS — G2581 Restless legs syndrome: Secondary | ICD-10-CM | POA: Diagnosis not present

## 2022-01-06 DIAGNOSIS — K219 Gastro-esophageal reflux disease without esophagitis: Secondary | ICD-10-CM | POA: Diagnosis not present

## 2022-01-06 DIAGNOSIS — M542 Cervicalgia: Secondary | ICD-10-CM | POA: Diagnosis not present

## 2022-01-06 DIAGNOSIS — G894 Chronic pain syndrome: Secondary | ICD-10-CM | POA: Diagnosis not present

## 2022-01-06 DIAGNOSIS — G8929 Other chronic pain: Secondary | ICD-10-CM | POA: Diagnosis not present

## 2022-01-06 DIAGNOSIS — Z79899 Other long term (current) drug therapy: Secondary | ICD-10-CM | POA: Diagnosis not present

## 2022-01-06 DIAGNOSIS — G6289 Other specified polyneuropathies: Secondary | ICD-10-CM | POA: Diagnosis not present

## 2022-01-06 DIAGNOSIS — G4089 Other seizures: Secondary | ICD-10-CM | POA: Diagnosis not present

## 2022-01-06 DIAGNOSIS — M5440 Lumbago with sciatica, unspecified side: Secondary | ICD-10-CM | POA: Diagnosis not present

## 2022-01-06 DIAGNOSIS — M1990 Unspecified osteoarthritis, unspecified site: Secondary | ICD-10-CM | POA: Diagnosis not present

## 2022-01-06 DIAGNOSIS — G35 Multiple sclerosis: Secondary | ICD-10-CM | POA: Diagnosis not present

## 2022-01-09 ENCOUNTER — Inpatient Hospital Stay
Admission: EM | Admit: 2022-01-09 | Discharge: 2022-01-11 | DRG: 271 | Disposition: A | Discharge status: Home / Self Care | Payer: Medicare HMO | Attending: Osteopathic Medicine | Admitting: Osteopathic Medicine

## 2022-01-09 ENCOUNTER — Emergency Department: Payer: Medicare HMO

## 2022-01-09 ENCOUNTER — Ambulatory Visit: Payer: Self-pay

## 2022-01-09 ENCOUNTER — Telehealth: Payer: Self-pay

## 2022-01-09 ENCOUNTER — Other Ambulatory Visit: Payer: Self-pay

## 2022-01-09 DIAGNOSIS — E039 Hypothyroidism, unspecified: Secondary | ICD-10-CM | POA: Diagnosis present

## 2022-01-09 DIAGNOSIS — I82421 Acute embolism and thrombosis of right iliac vein: Secondary | ICD-10-CM | POA: Diagnosis present

## 2022-01-09 DIAGNOSIS — I82409 Acute embolism and thrombosis of unspecified deep veins of unspecified lower extremity: Secondary | ICD-10-CM | POA: Diagnosis present

## 2022-01-09 DIAGNOSIS — G35D Multiple sclerosis, unspecified: Secondary | ICD-10-CM | POA: Diagnosis present

## 2022-01-09 DIAGNOSIS — E785 Hyperlipidemia, unspecified: Secondary | ICD-10-CM | POA: Diagnosis present

## 2022-01-09 DIAGNOSIS — K5090 Crohn's disease, unspecified, without complications: Secondary | ICD-10-CM | POA: Diagnosis present

## 2022-01-09 DIAGNOSIS — Z79890 Hormone replacement therapy (postmenopausal): Secondary | ICD-10-CM

## 2022-01-09 DIAGNOSIS — G2581 Restless legs syndrome: Secondary | ICD-10-CM | POA: Diagnosis present

## 2022-01-09 DIAGNOSIS — G35 Multiple sclerosis: Secondary | ICD-10-CM | POA: Diagnosis present

## 2022-01-09 DIAGNOSIS — Z885 Allergy status to narcotic agent status: Secondary | ICD-10-CM

## 2022-01-09 DIAGNOSIS — I82401 Acute embolism and thrombosis of unspecified deep veins of right lower extremity: Secondary | ICD-10-CM | POA: Diagnosis not present

## 2022-01-09 DIAGNOSIS — Z720 Tobacco use: Secondary | ICD-10-CM | POA: Diagnosis not present

## 2022-01-09 DIAGNOSIS — F17210 Nicotine dependence, cigarettes, uncomplicated: Secondary | ICD-10-CM | POA: Diagnosis present

## 2022-01-09 DIAGNOSIS — Z7409 Other reduced mobility: Secondary | ICD-10-CM | POA: Diagnosis present

## 2022-01-09 DIAGNOSIS — G8929 Other chronic pain: Secondary | ICD-10-CM | POA: Diagnosis present

## 2022-01-09 DIAGNOSIS — Z7983 Long term (current) use of bisphosphonates: Secondary | ICD-10-CM | POA: Diagnosis not present

## 2022-01-09 DIAGNOSIS — D72829 Elevated white blood cell count, unspecified: Secondary | ICD-10-CM | POA: Diagnosis present

## 2022-01-09 DIAGNOSIS — I82431 Acute embolism and thrombosis of right popliteal vein: Secondary | ICD-10-CM | POA: Diagnosis present

## 2022-01-09 DIAGNOSIS — H3530 Unspecified macular degeneration: Secondary | ICD-10-CM | POA: Diagnosis present

## 2022-01-09 DIAGNOSIS — I82411 Acute embolism and thrombosis of right femoral vein: Secondary | ICD-10-CM | POA: Diagnosis present

## 2022-01-09 DIAGNOSIS — K219 Gastro-esophageal reflux disease without esophagitis: Secondary | ICD-10-CM | POA: Diagnosis present

## 2022-01-09 DIAGNOSIS — Z86718 Personal history of other venous thrombosis and embolism: Secondary | ICD-10-CM | POA: Diagnosis present

## 2022-01-09 DIAGNOSIS — I82441 Acute embolism and thrombosis of right tibial vein: Secondary | ICD-10-CM | POA: Diagnosis present

## 2022-01-09 DIAGNOSIS — Z79899 Other long term (current) drug therapy: Secondary | ICD-10-CM | POA: Diagnosis not present

## 2022-01-09 LAB — CBC WITH DIFFERENTIAL/PLATELET
Abs Immature Granulocytes: 0.05 10*3/uL (ref 0.00–0.07)
Basophils Absolute: 0 10*3/uL (ref 0.0–0.1)
Basophils Relative: 0 %
Eosinophils Absolute: 0.1 10*3/uL (ref 0.0–0.5)
Eosinophils Relative: 1 %
HCT: 39.8 % (ref 36.0–46.0)
Hemoglobin: 12.6 g/dL (ref 12.0–15.0)
Immature Granulocytes: 0 %
Lymphocytes Relative: 13 %
Lymphs Abs: 1.5 10*3/uL (ref 0.7–4.0)
MCH: 28.6 pg (ref 26.0–34.0)
MCHC: 31.7 g/dL (ref 30.0–36.0)
MCV: 90.2 fL (ref 80.0–100.0)
Monocytes Absolute: 0.7 10*3/uL (ref 0.1–1.0)
Monocytes Relative: 6 %
Neutro Abs: 9.4 10*3/uL — ABNORMAL HIGH (ref 1.7–7.7)
Neutrophils Relative %: 80 %
Platelets: 235 10*3/uL (ref 150–400)
RBC: 4.41 MIL/uL (ref 3.87–5.11)
RDW: 13.8 % (ref 11.5–15.5)
WBC: 11.8 10*3/uL — ABNORMAL HIGH (ref 4.0–10.5)
nRBC: 0 % (ref 0.0–0.2)

## 2022-01-09 LAB — COMPREHENSIVE METABOLIC PANEL
ALT: 13 U/L (ref 0–44)
AST: 18 U/L (ref 15–41)
Albumin: 3.9 g/dL (ref 3.5–5.0)
Alkaline Phosphatase: 126 U/L (ref 38–126)
Anion gap: 12 (ref 5–15)
BUN: 9 mg/dL (ref 8–23)
CO2: 24 mmol/L (ref 22–32)
Calcium: 9.1 mg/dL (ref 8.9–10.3)
Chloride: 103 mmol/L (ref 98–111)
Creatinine, Ser: 0.7 mg/dL (ref 0.44–1.00)
GFR, Estimated: >60 mL/min (ref >60)
Glucose, Bld: 118 mg/dL — ABNORMAL HIGH (ref 70–99)
Potassium: 3.6 mmol/L (ref 3.5–5.1)
Sodium: 139 mmol/L (ref 135–145)
Total Bilirubin: 1.2 mg/dL (ref 0.3–1.2)
Total Protein: 6.6 g/dL (ref 6.5–8.1)

## 2022-01-09 LAB — APTT: aPTT: 29 seconds (ref 24–36)

## 2022-01-09 LAB — PROTIME-INR
INR: 1.1 (ref 0.8–1.2)
Prothrombin Time: 13.6 seconds (ref 11.4–15.2)

## 2022-01-09 LAB — LIPASE, BLOOD: Lipase: 34 U/L (ref 11–51)

## 2022-01-09 MED ORDER — PANTOPRAZOLE SODIUM 40 MG PO TBEC
40.0000 mg | DELAYED_RELEASE_TABLET | Freq: Every day | ORAL | Status: DC
  Administered 2022-01-09 – 2022-01-11 (×2): 40 mg via ORAL
  Filled 2022-01-09 (×2): qty 1

## 2022-01-09 MED ORDER — TRAZODONE HCL 100 MG PO TABS
100.0000 mg | ORAL_TABLET | Freq: Every day | ORAL | Status: DC
  Administered 2022-01-09 – 2022-01-10 (×2): 100 mg via ORAL
  Filled 2022-01-09 (×2): qty 1

## 2022-01-09 MED ORDER — METHOCARBAMOL 500 MG PO TABS: 500.0000 mg | ORAL_TABLET | Freq: Four times a day (QID) | ORAL | Status: DC | PRN

## 2022-01-09 MED ORDER — SULFASALAZINE 500 MG PO TBEC
500.0000 mg | DELAYED_RELEASE_TABLET | Freq: Two times a day (BID) | ORAL | Status: DC
  Administered 2022-01-09 – 2022-01-11 (×3): 500 mg via ORAL
  Filled 2022-01-09 (×5): qty 1

## 2022-01-09 MED ORDER — MORPHINE SULFATE (PF) 2 MG/ML IV SOLN
2.0000 mg | INTRAVENOUS | Status: DC | PRN
  Filled 2022-01-09: qty 1

## 2022-01-09 MED ORDER — ONDANSETRON 4 MG PO TBDP
4.0000 mg | ORAL_TABLET | Freq: Once | ORAL | Status: AC
  Administered 2022-01-09: 4 mg via ORAL
  Filled 2022-01-09: qty 1

## 2022-01-09 MED ORDER — HEPARIN BOLUS VIA INFUSION
5000.0000 [IU] | Freq: Once | INTRAVENOUS | Status: AC
  Administered 2022-01-09: 5000 [IU] via INTRAVENOUS
  Filled 2022-01-09: qty 5000

## 2022-01-09 MED ORDER — MONTELUKAST SODIUM 10 MG PO TABS
10.0000 mg | ORAL_TABLET | Freq: Every day | ORAL | Status: DC
  Administered 2022-01-09 – 2022-01-10 (×2): 10 mg via ORAL
  Filled 2022-01-09 (×2): qty 1

## 2022-01-09 MED ORDER — GABAPENTIN 300 MG PO CAPS
600.0000 mg | ORAL_CAPSULE | Freq: Two times a day (BID) | ORAL | Status: DC
  Administered 2022-01-09 – 2022-01-11 (×3): 600 mg via ORAL
  Filled 2022-01-09 (×3): qty 2

## 2022-01-09 MED ORDER — HEPARIN (PORCINE) 25000 UT/250ML-% IV SOLN
1000.0000 [IU]/h | INTRAVENOUS | Status: DC
  Administered 2022-01-09: 1100 [IU]/h via INTRAVENOUS
  Administered 2022-01-11: 1000 [IU]/h via INTRAVENOUS
  Filled 2022-01-09: qty 250

## 2022-01-09 MED ORDER — SODIUM CHLORIDE 0.9% FLUSH
3.0000 mL | Freq: Two times a day (BID) | INTRAVENOUS | Status: DC
Start: 2022-01-09 — End: 2022-01-11
  Administered 2022-01-09: 3 mL via INTRAVENOUS

## 2022-01-09 MED ORDER — SODIUM CHLORIDE 0.9 % IV SOLN: INTRAVENOUS | Status: AC

## 2022-01-09 MED ORDER — LEVOTHYROXINE SODIUM 112 MCG PO TABS
112.0000 ug | ORAL_TABLET | Freq: Every day | ORAL | Status: DC
  Administered 2022-01-11: 112 ug via ORAL
  Filled 2022-01-09 (×2): qty 1

## 2022-01-09 MED ORDER — OXYCODONE-ACETAMINOPHEN 5-325 MG PO TABS
0.5000 | ORAL_TABLET | Freq: Two times a day (BID) | ORAL | Status: DC
  Administered 2022-01-09 – 2022-01-11 (×3): 0.5 via ORAL
  Filled 2022-01-09 (×3): qty 1

## 2022-01-09 MED ORDER — OXYCODONE-ACETAMINOPHEN 5-325 MG PO TABS
1.0000 | ORAL_TABLET | Freq: Once | ORAL | Status: AC
  Administered 2022-01-09: 1 via ORAL
  Filled 2022-01-09: qty 1

## 2022-01-09 MED ORDER — ROPINIROLE HCL 1 MG PO TABS
2.0000 mg | ORAL_TABLET | Freq: Every day | ORAL | Status: DC
  Administered 2022-01-09 – 2022-01-10 (×2): 2 mg via ORAL
  Filled 2022-01-09 (×2): qty 2

## 2022-01-09 MED ORDER — ONDANSETRON HCL 4 MG/2ML IJ SOLN: 4.0000 mg | Freq: Four times a day (QID) | INTRAMUSCULAR | Status: DC | PRN

## 2022-01-09 MED ORDER — ONDANSETRON HCL 4 MG PO TABS: 4.0000 mg | ORAL_TABLET | Freq: Four times a day (QID) | ORAL | Status: DC | PRN

## 2022-01-09 NOTE — H&P (Signed)
History and Physical    Brooke Juarez RCB:[id number] DOB: 1950-06-18 DOA: 01/09/2022  PCP: Juline Patch, MD  Patient coming from: Home  Chief Complaint: RLE pain and swelling  HPI: Brooke Juarez is a 71 y.o. female with medical history significant of Crohn's disease, GERD, MDD, HLD, hypothyroidism, MS, macular degeneration who presents for RLE swelling and pain.  She notes that the symptoms started "out of the blue" on Monday.  She had no preceding surgery, illness, long air or car travel.  She notes never having this before and having no family history of this.  She feels she is up to date on her cancer screening.  US done in the ED showed extensive DVT of that leg.  Dr. Lucky Cowboy in Vascular was consulted and will plan to do thrombectomy tomorrow.  Heparin ggt started.  Patient does have MS which holds a risk for DVT, but the absolute risk is low.  She is not on OCP.  She is a daily tobacco smoker.   ED Course: Labs relatively normal.  Pain controlled with oral pain medications.  WBC mildly elevated with neutrophil predominance, however, she has no symptoms of an infection.   Review of Systems: As per HPI otherwise all other systems reviewed and are negative.  Past Medical History:  Diagnosis Date   Arthritis    Crohn disease ([place])    Depression    GERD (gastroesophageal reflux disease)    Headache    Migraines   Heart murmur    Hyperlipidemia    Hypothyroidism    Macular degeneration    MS (multiple sclerosis) ([place])    Restless leg    Seizures ([place])    Tremors of nervous system     Past Surgical History:  Procedure Laterality Date   APPENDECTOMY Left 04/14/2018   Procedure: APPENDECTOMY;  Surgeon: Olean Ree, MD;  Location: [facility];  Service: General;  Laterality: Left;   CATARACT EXTRACTION W/PHACO Right 10/30/2014   Procedure: CATARACT EXTRACTION PHACO AND INTRAOCULAR LENS PLACEMENT ([place]);  Surgeon: Estill Cotta, MD;  Location: [facility];  Service: Ophthalmology;   Laterality: Right;  [place]  1:55.7    CATARACT EXTRACTION W/PHACO Left 06/18/2015   Procedure: CATARACT EXTRACTION PHACO AND INTRAOCULAR LENS PLACEMENT ([facility]);  Surgeon: Estill Cotta, MD;  Location: [facility];  Service: Ophthalmology;  Laterality: Left;  [place] 01:36 AP% 26.1 CDE 42.19 fluid pack lo0t # [id number] H   COLONOSCOPY  2014   normal- Rein- repeat in 5 years   COLONOSCOPY WITH PROPOFOL N/A 07/09/2020   Procedure: COLONOSCOPY WITH PROPOFOL;  Surgeon: Toledo, Benay Pike, MD;  Location: [facility];  Service: Gastroenterology;  Laterality: N/A;   EYE SURGERY     FOOT SURGERY     INGUINAL HERNIA REPAIR Right 04/14/2018   Procedure: HERNIA REPAIR INGUINAL;  Surgeon: Olean Ree, MD;  Location: [facility];  Service: General;  Laterality: Right;   NASAL SINUS SURGERY     TONSILLECTOMY     TUBAL LIGATION     VAGINAL HYSTERECTOMY      Social History  reports that she has been smoking cigarettes. She has a 41.25 pack-year smoking history. She has never used smokeless tobacco. She reports that she does not drink alcohol and does not use drugs. She smokes about 3/4 of a pack per day.  She has never needed nicotine patch in prior hospitalizations.   Allergies  Allergen Reactions   Codeine Nausea And Vomiting    Family History  Problem Relation Age of  Onset   Heart disease Mother    Stroke Mother    Cancer Mother        colon   Mitral valve prolapse Mother    Stroke Father    Breast cancer Neg Hx     Prior to Admission medications   Medication Sig Start Date End Date Taking? Authorizing Provider  acetaminophen (TYLENOL) 500 MG tablet Take 1,000 mg by mouth 2 (two) times daily as needed for moderate pain or headache.    [provider]  alendronate (FOSAMAX) 70 MG tablet TAKE 1 TABLET BY MOUTH ONCE A WEEK ON AN EMPTY STOMACH WITH  A  FULL  GLASS  OF  WATER 12/02/21   Juline Patch, MD  cholecalciferol (VITAMIN D3) 10 MCG (400 UNIT) TABS tablet Take 400 Units by mouth.     [provider]  gabapentin (NEURONTIN) 300 MG capsule Take 1 capsule by mouth 4 (four) times daily. [name] 09/21/18 12/02/21  [provider]  levothyroxine (EUTHYROX) 112 MCG tablet Take 1 tablet (112 mcg total) by mouth daily. 12/02/21   Juline Patch, MD  lidocaine (LIDODERM) 5 % SMARTSIG:Topical 10/07/21   [provider]  methocarbamol (ROBAXIN) 500 MG tablet SMARTSIG:0.5 Tablet(s) By Mouth Every 12 Hours PRN 11/04/21   [provider]  montelukast (SINGULAIR) 10 MG tablet Take 1 tablet (10 mg total) by mouth at bedtime. 12/02/21   Juline Patch, MD  Multiple Vitamins-Minerals (PRESERVISION AREDS 2 PO) Take 2 capsules by mouth daily.    [provider]  Omega-3 Fatty Acids (FISH OIL) 1000 MG CAPS Take by mouth.    [provider]  oxyCODONE-acetaminophen (PERCOCET/ROXICET) 5-325 MG tablet SMARTSIG:0.5 Tablet(s) By Mouth Every 12 Hours PRN 10/14/21   [provider]  pantoprazole (PROTONIX) 40 MG tablet Take 1 tablet (40 mg total) by mouth daily. 12/02/21   Juline Patch, MD  potassium chloride (KLOR-CON) 10 MEQ tablet TAKE 1 TABLET BY MOUTH 3 TIMES PER WEEK 12/02/21   Juline Patch, MD  rOPINIRole (REQUIP) 1 MG tablet Take 2 tablets by mouth at bedtime. 03/28/21 03/28/22  [provider]  sulfaSALAzine (AZULFIDINE) 500 MG EC tablet Take 500 mg by mouth 2 (two) times daily. 08/26/21   [provider]  traZODone (DESYREL) 100 MG tablet Take 1 tablet (100 mg total) by mouth daily. 12/02/21   Juline Patch, MD  vitamin B-12 (CYANOCOBALAMIN) 500 MCG tablet Take 500 mcg by mouth every 14 (fourteen) days.     [provider]    Physical Exam: Vitals:   01/09/22 1212 01/09/22 1647 01/09/22 1653  BP: 134/70  124/65  Pulse: 90  88  Resp: 17  16  Temp: 98.3 F (36.8 C)  98.8 F (37.1 C)  TempSrc: Oral  Oral  SpO2: 99%  96%  Weight:  64.9 kg   Height:  5' 2.25" (1.581 m)     Constitutional: NAD, calm,  comfortable, sitting in chair Eyes: lids and conjunctivae normal ENMT: Mucous membranes are dry Neck: normal, supple, she has a rightward lean to head, appears preferential and not permanent  Respiratory: CTAB, no wheezing or crackles.  Breathing comfortably on room air Cardiovascular: RR, NR, no murmur.  She has impressive non pitting edema to the mid thigh, worse at the calf and ankle.  Abdomen: ND, NT Musculoskeletal: No cyanosis, she has pain to palpation of the right leg.  Right leg is warmer than left.  Skin: no rashes, lesions, ulcers on exposed  skin.  Very minimal erythema on LE.  Neurologic: Sensation intact to light touch, speech normal.  Psychiatric: Normal judgment and insight. Alert and oriented x 3. Normal mood.    Labs on Admission: I have personally reviewed following labs and imaging studies  CBC: Recent Labs  Lab 01/09/22 1656  WBC 11.8*  NEUTROABS 9.4*  HGB 12.6  HCT 39.8  MCV 90.2  PLT 465    Basic Metabolic Panel: Recent Labs  Lab 01/09/22 1656  NA 139  K 3.6  CL 103  CO2 24  GLUCOSE 118*  BUN 9  CREATININE 0.70  CALCIUM 9.1    GFR: Estimated Creatinine Clearance: 55.8 mL/min (by C-G formula based on SCr of 0.7 mg/dL).  Liver Function Tests: Recent Labs  Lab 01/09/22 1656  AST 18  ALT 13  ALKPHOS 126  BILITOT 1.2  PROT 6.6  ALBUMIN 3.9    Urine analysis: No results found for: "COLORURINE", "APPEARANCEUR", "LABSPEC", "PHURINE", "GLUCOSEU", "HGBUR", "BILIRUBINUR", "KETONESUR", "PROTEINUR", "UROBILINOGEN", "NITRITE", "LEUKOCYTESUR"  Radiological Exams on Admission: US Venous Img Lower Right (DVT Study)  Result Date: 01/09/2022 CLINICAL DATA:  Right leg pain and swelling since Monday. EXAM: RIGHT LOWER EXTREMITY VENOUS DOPPLER ULTRASOUND TECHNIQUE: Gray-scale sonography with compression, as well as color and duplex ultrasound, were performed to evaluate the deep venous system(s) from the level of the common femoral vein through the  popliteal and proximal calf veins. COMPARISON:  None Available. FINDINGS: VENOUS Acute occlusive thrombus involving the right common femoral vein, right saphenofemoral junction, right profunda femoral vein, right femoral vein, right popliteal vein, and right posterior tibial vein. The peroneal vein is not well visualized. Limited views of the contralateral common femoral vein are unremarkable. OTHER None. Limitations: None. IMPRESSION: 1. Extensive acute occlusive DVT throughout the right leg extending from the common femoral vein into the posterior tibial vein. Electronically Signed   By: Titus Dubin M.D.   On: 01/09/2022 13:31      Assessment/Plan  DVT (deep venous thrombosis) ([place]) - Vascular, Dr. Lucky Cowboy consulted - plan for thrombectomy tomorrow - NPO at midnight - Heparin per pharmacy - ggt - pain control with home percocet and morphine PRN - Nausea control with zofran - Ensure appropriate cancer screening on discharge with PCP  MS (multiple sclerosis) ([place]) Limited mobility with chronic pain & MS - Pain control as noted above - Possible risk factor for DVT  Crohn's disease, not in flare - Continue sulfasalazine   Acid reflux - Continue protonix    Restless leg - Continue ropinirol and trazodone    Adult hypothyroidism - Continue levothyroxine    Tobacco abuse - Nicotine patch if needed - Counseling on cessation prior to discharge  Mild Leukocytosis - Likely related to acute issue above, trend  DVT prophylaxis: Heparin ggt  Code Status:   Full  Family Communication:  Husband and brother at bedside  Disposition Plan:   Patient is from:  Home  Anticipated DC to:  Home  Anticipated DC date:  01/10/22  Anticipated DC barriers: Need to stay another night after thrombectomy  Consults called:  Dr. Lucky Cowboy Vascular  Admission status:  Med Surg, Obs   Severity of Illness: The appropriate patient status for this patient is OBSERVATION. Observation status is judged to be  reasonable and necessary in order to provide the required intensity of service to ensure the patient's safety. The patient's presenting symptoms, physical exam findings, and initial radiographic and laboratory data in the context of their medical condition is felt to  place them at decreased risk for further clinical deterioration. Furthermore, it is anticipated that the patient will be medically stable for discharge from the hospital within 2 midnights of admission.     Gilles Chiquito MD [facility]  How to contact the [facility] Attending or Consulting provider [place] or covering provider during after hours [place], for this patient?   Check the care team in [facility] and look for a) attending/consulting [facility] provider listed and b) the [facility] team listed Log into [email or web address] and use [place]'s universal password to access. If you do not have the password, please contact the hospital operator. Locate the [facility] provider you are looking for under [facility] and page to a number that you can be directly reached. If you still have difficulty reaching the provider, please page the [facility] (Director on Call) for the Hospitalists listed on amion for assistance.  01/09/2022, 6:28 PM

## 2022-01-09 NOTE — ED Provider Notes (Signed)
[facility] Provider Note  Patient Contact: 4:41 PM (approximate)   History   Leg Pain (Right)   HPI  Brooke Juarez is a 71 y.o. female with a history of immobility and daily smoking, presents to the emergency department with right lower extremity swelling that started on Monday.  Patient's right thigh is approximately 1 and half times her left.  Denies history of prior DVT or PE.  No chest pain, chest tightness or shortness of breath.  Patient is not currently anticoagulated.      Physical Exam   Triage Vital Signs: ED Triage Vitals [01/09/22 1212]  Enc Vitals Group     BP 134/70     Pulse Rate 90     Resp 17     Temp 98.3 F (36.8 C)     Temp Source Oral     SpO2 99 %     Weight      Height      Head Circumference      Peak Flow      Pain Score 5     Pain Loc      Pain Edu?      Excl. in [place]?     Most recent vital signs: Vitals:   01/09/22 1212 01/09/22 1653  BP: 134/70 124/65  Pulse: 90 88  Resp: 17 16  Temp: 98.3 F (36.8 C) 98.8 F (37.1 C)  SpO2: 99% 96%     General: Alert and in no acute distress. Eyes:  PERRL. EOMI. Head: No acute traumatic findings ENT:      Nose: No congestion/rhinnorhea.      Mouth/Throat: Mucous membranes are moist.  Neck: No stridor. No cervical spine tenderness to palpation. Cardiovascular:  Good peripheral perfusion Respiratory: Normal respiratory effort without tachypnea or retractions. Lungs CTAB. Good air entry to the bases with no decreased or absent breath sounds. Gastrointestinal: Bowel sounds 4 quadrants. Soft and nontender to palpation. No guarding or rigidity. No palpable masses. No distention. No CVA tenderness. Musculoskeletal: Full range of motion to all extremities.  Patient's right leg is 1-1/2 times the left.  Palpable dorsalis pedis pulse bilaterally and symmetrically. Neurologic:  No gross focal neurologic deficits are appreciated.  Skin: Patient has mild erythema overlying right  lower extremity. Other:   ED Results / Procedures / Treatments   Labs (all labs ordered are listed, but only abnormal results are displayed) Labs Reviewed  CBC WITH DIFFERENTIAL/PLATELET - Abnormal; Notable for the following components:      Result Value   WBC 11.8 (*)    Neutro Abs 9.4 (*)    All other components within normal limits  COMPREHENSIVE METABOLIC PANEL - Abnormal; Notable for the following components:   Glucose, Bld 118 (*)    All other components within normal limits  LIPASE, BLOOD       RADIOLOGY  I personally viewed and evaluated these images as part of my medical decision making, as well as reviewing the written report by the radiologist.  ED Provider Interpretation: Patient has extensive occlusive DVT from common femoral vein to tibial veins.   PROCEDURES:  Critical Care performed: No  Procedures   MEDICATIONS ORDERED IN ED: Medications - No data to display   IMPRESSION / MDM / [place] / ED COURSE  I reviewed the triage vital signs and the nursing notes.  Assessment and plan Leg swelling:  71 year old female presents to the emergency department with leg pain and swelling that started on Monday.  Vital signs are reassuring at triage.  On exam, patient was alert, active and nontoxic-appearing.  Patient had significant swelling of the right lower extremity, right leg was 1 and half times the size of the left with a palpable dorsalis pedis pulse.  Given extent of swelling, I reached out to vascular specialist on-call, Dr. Lucky Cowboy.  Dr. Lucky Cowboy requested that patient be admitted for likely emergent thrombectomy in the a.m.   CBC, CMP and lipase overall reassuring.  Patient was excepted to the hospitalist service for admission.    FINAL CLINICAL IMPRESSION(S) / ED DIAGNOSES   Final diagnoses:  Acute deep vein thrombosis (DVT) of right lower extremity, unspecified vein ([place])     Rx / DC Orders   ED Discharge  Orders     None        Note:  This document was prepared using Dragon voice recognition software and may include unintentional dictation errors.   Vallarie Mare [place], PA-C 01/09/22 1802    Naaman Plummer, MD 01/09/22 [phone]

## 2022-01-09 NOTE — ED Triage Notes (Signed)
Pt presents to ED for concerns of DVT to R lower leg due to swelling and pain to the R calf.

## 2022-01-09 NOTE — Telephone Encounter (Signed)
Called pt could not leave VM. Pt advised to go to ED.   KP

## 2022-01-09 NOTE — Telephone Encounter (Signed)
Spoke to pt after receiving CRM concerning R) leg swelling. She has had leg swelling since Monday. She is waiting on husband to return home so she can go to hospital for further evaluation/ possible doppler to check for DVT.

## 2022-01-09 NOTE — ED Provider Triage Note (Signed)
Emergency Medicine Provider Triage Evaluation Note  Brooke Juarez , a 71 y.o. female  was evaluated in triage.  Pt complains of right leg pain.  She reports that this has been ongoing since Monday.  She denies any recent surgeries, periods immobilization, hospitalizations, splints.  No history of PE/DVT.  No chest pain or shortness of breath.  No skin changes  Review of Systems  Positive: Right calf pain Negative: Chest pain or shortness of breath  Physical Exam  LMP  (LMP Unknown) Comment: had hysterectomy Gen:   Awake, no distress   Resp:  Normal effort  MSK:   Moves extremities without difficulty.  No warmth or erythema.  Normal range of motion of ankle and knee.  Mild tenderness to calf.  No skin changes Other:    Medical Decision Making  Medically screening exam initiated at 12:10 PM.  Appropriate orders placed.  Brooke Juarez was informed that the remainder of the evaluation will be completed by another provider, this initial triage assessment does not replace that evaluation, and the importance of remaining in the ED until their evaluation is complete.     Marquette Old, PA-C 01/09/22 1212

## 2022-01-09 NOTE — Consult Note (Signed)
ANTICOAGULATION CONSULT NOTE - Follow Up Consult  Pharmacy Consult for heparin gtt Indication:  VTE Treatment  Allergies  Allergen Reactions   Codeine Nausea And Vomiting    Patient Measurements: Height: 5' 2.25" (158.1 cm) Weight: 64.9 kg (143 lb 1.3 oz) IBW/kg (Calculated) : 50.68 Heparin Dosing Weight: 63.8kg  Vital Signs: Temp: 98.8 F (37.1 C) (08/24 1653) Temp Source: Oral (08/24 1653) BP: 124/65 (08/24 1653) Pulse Rate: 88 (08/24 1653)  Labs: Recent Labs    01/09/22 1656  HGB 12.6  HCT 39.8  PLT 235  CREATININE 0.70    Estimated Creatinine Clearance: 55.8 mL/min (by C-G formula based on SCr of 0.7 mg/dL).   Medications: Heparin Dosing Weight: 63.8kg PTA: no AC or APT PTA Inpatient: +heparin gtt  Assessment: 71yo F w/ h/o Crohn's disease, GERD, MDD, HLD, hypothyroidism, MS, macular degeneration who presents for RLE swelling and pain diagnosed with acute DVT of RLE. No AC or APT noted prior to arrival. Planning thrombectomy tomorrow AM on 8/25. Pharmacy consulted for heparin gtt mgmt.  Date Time aPTT/HL Rate/Comment       Baseline Labs: aPTT - pending INR - pending Hgb - 12.6 Plts - 235  Goal of Therapy:  Heparin level 0.3-0.7 units/ml Monitor platelets by anticoagulation protocol: Yes   Plan:  Give 5000 units bolus x1; then start heparin infusion at 1100 units/hr Check anti-Xa level in 8 hours and daily once consecutively therapeutic. Continue to monitor H&H and platelets daily while on heparin gtt.  Shanon Brow Kahlin Mark 01/09/2022,6:51 PM

## 2022-01-09 NOTE — Telephone Encounter (Signed)
Message from Sharene Skeans sent at 01/09/2022  9:42 AM EDT  Summary: Leg swelling   Pt has leg swelling in right leg since Monday / from her pelvis to her toes / pt wanted an appt today/ please advise         Requested Prescriptions    Chief Complaint: severe leg swelling and pain Symptoms: calf pain and "hardness" to the calf, swelling from hip to toes 1 leg only), moderate to severe pain and constant Frequency: since Monday Pertinent Negatives: Patient denies chest pain, breathing difficulty, headache Disposition: [x] ED /[] Urgent Care (no appt availability in office) / [] Appointment(In office/virtual)/ []  [facility]/ [] Home Care/ [] Refused Recommended Disposition /[] [facility] [place] [facility]/ []  Follow-up with PCP Additional Notes: Advised pt to go to ED to r/o potential DVT- pt will go to [facility] ED.  Reason for Disposition  SEVERE leg swelling (e.g., swelling extends above knee, entire leg is swollen, weeping fluid)  Answer Assessment - Initial Assessment Questions 1. ONSET: "When did the swelling start?" (e.g., minutes, hours, days)     Monday 2. LOCATION: "What part of the leg is swollen?"  "Are both legs swollen or just one leg?"     Right leg from hip to toes 3. SEVERITY: "How bad is the swelling?" (e.g., localized; mild, moderate, severe)   - Localized: Small area of swelling localized to one leg.   - MILD pedal edema: Swelling limited to foot and ankle, pitting edema < 1/4 inch (6 mm) deep, rest and elevation eliminate most or all swelling.   - MODERATE edema: Swelling of lower leg to knee, pitting edema > 1/4 inch (6 mm) deep, rest and elevation only partially reduce swelling.   - SEVERE edema: Swelling extends above knee, facial or hand swelling present.      severe 4. REDNESS: "Does the swelling look red or infected?"     no 5. PAIN: "Is the swelling painful to touch?" If Yes, ask: "How painful is it?"   (Scale 1-10; mild, moderate or severe)      Constant-severe with moderate 6. FEVER: "Do you have a fever?" If Yes, ask: "What is it, how was it measured, and when did it start?"      no 7. CAUSE: "What do you think is causing the leg swelling?"     unsure 8. MEDICAL HISTORY: "Do you have a history of blood clots (e.g., DVT), cancer, heart failure, kidney disease, or liver failure?"     no 9. RECURRENT SYMPTOM: "Have you had leg swelling before?" If Yes, ask: "When was the last time?" "What happened that time?"     no 10. OTHER SYMPTOMS: "Do you have any other symptoms?" (e.g., chest pain, difficulty breathing)       Tightness- chills earlier in the week  11. PREGNANCY: "Is there any chance you are pregnant?" "When was your last menstrual period?"       N/a  Protocols used: Leg Swelling and Edema-A-AH

## 2022-01-10 ENCOUNTER — Encounter
Admission: EM | Disposition: A | Discharge status: Home / Self Care | Payer: Self-pay | Source: Home / Self Care | Attending: Osteopathic Medicine

## 2022-01-10 DIAGNOSIS — F17210 Nicotine dependence, cigarettes, uncomplicated: Secondary | ICD-10-CM | POA: Diagnosis present

## 2022-01-10 DIAGNOSIS — H3530 Unspecified macular degeneration: Secondary | ICD-10-CM | POA: Diagnosis present

## 2022-01-10 DIAGNOSIS — I82431 Acute embolism and thrombosis of right popliteal vein: Secondary | ICD-10-CM | POA: Diagnosis present

## 2022-01-10 DIAGNOSIS — D72829 Elevated white blood cell count, unspecified: Secondary | ICD-10-CM | POA: Diagnosis present

## 2022-01-10 DIAGNOSIS — Z7409 Other reduced mobility: Secondary | ICD-10-CM | POA: Diagnosis not present

## 2022-01-10 DIAGNOSIS — G2581 Restless legs syndrome: Secondary | ICD-10-CM

## 2022-01-10 DIAGNOSIS — G8929 Other chronic pain: Secondary | ICD-10-CM | POA: Diagnosis present

## 2022-01-10 DIAGNOSIS — Z79890 Hormone replacement therapy (postmenopausal): Secondary | ICD-10-CM | POA: Diagnosis not present

## 2022-01-10 DIAGNOSIS — K5090 Crohn's disease, unspecified, without complications: Secondary | ICD-10-CM | POA: Diagnosis present

## 2022-01-10 DIAGNOSIS — K219 Gastro-esophageal reflux disease without esophagitis: Secondary | ICD-10-CM

## 2022-01-10 DIAGNOSIS — G35 Multiple sclerosis: Secondary | ICD-10-CM

## 2022-01-10 DIAGNOSIS — E039 Hypothyroidism, unspecified: Secondary | ICD-10-CM | POA: Diagnosis present

## 2022-01-10 DIAGNOSIS — I82401 Acute embolism and thrombosis of unspecified deep veins of right lower extremity: Secondary | ICD-10-CM

## 2022-01-10 DIAGNOSIS — Z7983 Long term (current) use of bisphosphonates: Secondary | ICD-10-CM | POA: Diagnosis not present

## 2022-01-10 DIAGNOSIS — Z885 Allergy status to narcotic agent status: Secondary | ICD-10-CM | POA: Diagnosis not present

## 2022-01-10 DIAGNOSIS — E785 Hyperlipidemia, unspecified: Secondary | ICD-10-CM | POA: Diagnosis present

## 2022-01-10 DIAGNOSIS — Z79899 Other long term (current) drug therapy: Secondary | ICD-10-CM | POA: Diagnosis not present

## 2022-01-10 DIAGNOSIS — I82421 Acute embolism and thrombosis of right iliac vein: Secondary | ICD-10-CM

## 2022-01-10 DIAGNOSIS — I82441 Acute embolism and thrombosis of right tibial vein: Secondary | ICD-10-CM | POA: Diagnosis present

## 2022-01-10 DIAGNOSIS — I82411 Acute embolism and thrombosis of right femoral vein: Secondary | ICD-10-CM | POA: Diagnosis present

## 2022-01-10 DIAGNOSIS — Z720 Tobacco use: Secondary | ICD-10-CM

## 2022-01-10 DIAGNOSIS — I82409 Acute embolism and thrombosis of unspecified deep veins of unspecified lower extremity: Secondary | ICD-10-CM | POA: Diagnosis present

## 2022-01-10 HISTORY — PX: PERIPHERAL VASCULAR THROMBECTOMY: CATH118306

## 2022-01-10 LAB — CBC
HCT: 35.2 % — ABNORMAL LOW (ref 36.0–46.0)
Hemoglobin: 11.2 g/dL — ABNORMAL LOW (ref 12.0–15.0)
MCH: 28.4 pg (ref 26.0–34.0)
MCHC: 31.8 g/dL (ref 30.0–36.0)
MCV: 89.3 fL (ref 80.0–100.0)
Platelets: 183 10*3/uL (ref 150–400)
RBC: 3.94 MIL/uL (ref 3.87–5.11)
RDW: 13.6 % (ref 11.5–15.5)
WBC: 8 10*3/uL (ref 4.0–10.5)
nRBC: 0 % (ref 0.0–0.2)

## 2022-01-10 LAB — BASIC METABOLIC PANEL
Anion gap: 7 (ref 5–15)
BUN: 8 mg/dL (ref 8–23)
CO2: 24 mmol/L (ref 22–32)
Calcium: 8.1 mg/dL — ABNORMAL LOW (ref 8.9–10.3)
Chloride: 107 mmol/L (ref 98–111)
Creatinine, Ser: 0.7 mg/dL (ref 0.44–1.00)
GFR, Estimated: >60 mL/min (ref >60)
Glucose, Bld: 112 mg/dL — ABNORMAL HIGH (ref 70–99)
Potassium: 3.5 mmol/L (ref 3.5–5.1)
Sodium: 138 mmol/L (ref 135–145)

## 2022-01-10 LAB — PROTIME-INR
INR: 1.2 (ref 0.8–1.2)
Prothrombin Time: 15.2 seconds (ref 11.4–15.2)

## 2022-01-10 LAB — HEPARIN LEVEL (UNFRACTIONATED)
Heparin Unfractionated: 0.75 IU/mL — ABNORMAL HIGH (ref 0.30–0.70)
Heparin Unfractionated: 0.37 IU/mL (ref 0.30–0.70)

## 2022-01-10 SURGERY — PERIPHERAL VASCULAR THROMBECTOMY
Anesthesia: Moderate Sedation | Laterality: Right

## 2022-01-10 MED ORDER — ONDANSETRON HCL 4 MG/2ML IJ SOLN
4.0000 mg | Freq: Four times a day (QID) | INTRAMUSCULAR | Status: DC | PRN
Start: 2022-01-10 — End: 2022-01-10

## 2022-01-10 MED ORDER — IODIXANOL 320 MG/ML IV SOLN
INTRAVENOUS | Status: DC | PRN
  Administered 2022-01-10: 25 mL via INTRAVENOUS

## 2022-01-10 MED ORDER — FENTANYL CITRATE (PF) 100 MCG/2ML IJ SOLN
INTRAMUSCULAR | Status: DC | PRN
  Administered 2022-01-10: 50 ug via INTRAVENOUS

## 2022-01-10 MED ORDER — HEPARIN SODIUM (PORCINE) 1000 UNIT/ML IJ SOLN
INTRAMUSCULAR | Status: AC
  Filled 2022-01-10: qty 10

## 2022-01-10 MED ORDER — MIDAZOLAM HCL 5 MG/5ML IJ SOLN
INTRAMUSCULAR | Status: AC
  Filled 2022-01-10: qty 5

## 2022-01-10 MED ORDER — KETOROLAC TROMETHAMINE 15 MG/ML IJ SOLN
15.0000 mg | Freq: Once | INTRAMUSCULAR | Status: AC
  Administered 2022-01-10: 15 mg via INTRAVENOUS
  Filled 2022-01-10: qty 1

## 2022-01-10 MED ORDER — DIPHENHYDRAMINE HCL 50 MG/ML IJ SOLN: 50.0000 mg | Freq: Once | INTRAMUSCULAR | Status: DC | PRN

## 2022-01-10 MED ORDER — CEFAZOLIN SODIUM-DEXTROSE 2-4 GM/100ML-% IV SOLN
INTRAVENOUS | Status: AC
  Filled 2022-01-10: qty 100

## 2022-01-10 MED ORDER — CEFAZOLIN SODIUM-DEXTROSE 2-4 GM/100ML-% IV SOLN: 2.0000 g | INTRAVENOUS | Status: DC

## 2022-01-10 MED ORDER — FAMOTIDINE 20 MG PO TABS: 40.0000 mg | ORAL_TABLET | Freq: Once | ORAL | Status: DC | PRN

## 2022-01-10 MED ORDER — METHYLPREDNISOLONE SODIUM SUCC 125 MG IJ SOLR: 125.0000 mg | Freq: Once | INTRAMUSCULAR | Status: DC | PRN

## 2022-01-10 MED ORDER — CEFAZOLIN SODIUM-DEXTROSE 1-4 GM/50ML-% IV SOLN
INTRAVENOUS | Status: DC | PRN
  Administered 2022-01-10: 2 g via INTRAVENOUS

## 2022-01-10 MED ORDER — HYDROMORPHONE HCL 1 MG/ML IJ SOLN: 1.0000 mg | Freq: Once | INTRAMUSCULAR | Status: DC | PRN

## 2022-01-10 MED ORDER — MIDAZOLAM HCL 2 MG/ML PO SYRP: 8.0000 mg | ORAL_SOLUTION | Freq: Once | ORAL | Status: DC | PRN

## 2022-01-10 MED ORDER — SODIUM CHLORIDE 0.9 % IV SOLN: INTRAVENOUS | Status: DC

## 2022-01-10 MED ORDER — MIDAZOLAM HCL 2 MG/2ML IJ SOLN
INTRAMUSCULAR | Status: DC | PRN
  Administered 2022-01-10: 2 mg via INTRAVENOUS

## 2022-01-10 MED ORDER — HEPARIN SODIUM (PORCINE) 1000 UNIT/ML IJ SOLN
INTRAMUSCULAR | Status: DC | PRN
  Administered 2022-01-10: 3000 [IU] via INTRAVENOUS

## 2022-01-10 MED ORDER — FENTANYL CITRATE (PF) 100 MCG/2ML IJ SOLN
INTRAMUSCULAR | Status: AC
  Filled 2022-01-10: qty 2

## 2022-01-10 MED ORDER — HEPARIN (PORCINE) 25000 UT/250ML-% IV SOLN
INTRAVENOUS | Status: AC
  Filled 2022-01-10: qty 250

## 2022-01-10 SURGICAL SUPPLY — 9 items
CANISTER PENUMBRA ENGINE (MISCELLANEOUS) IMPLANT
CANNULA 5F STIFF (CANNULA) IMPLANT
CATH INDIGO 12XTORQ 100 (CATHETERS) IMPLANT
PACK ANGIOGRAPHY (CUSTOM PROCEDURE TRAY) ×1 IMPLANT
SHEATH BRITE TIP 6FRX11 (SHEATH) IMPLANT
SHEATH PINNACLE 11FRX10 (SHEATH) IMPLANT
SHEATH PROBE COVER 6X72 (BAG) IMPLANT
SUT PROLENE 0 CT 1 30 (SUTURE) IMPLANT
WIRE GUIDERIGHT .035X150 (WIRE) IMPLANT

## 2022-01-10 NOTE — Interval H&P Note (Signed)
History and Physical Interval Note:  01/10/2022 12:04 PM  Brooke Juarez  has presented today for surgery, with the diagnosis of Deep vein thrombosis.  The various methods of treatment have been discussed with the patient and family. After consideration of risks, benefits and other options for treatment, the patient has consented to  Procedure(s): PERIPHERAL VASCULAR THROMBECTOMY (Right) as a surgical intervention.  The patient's history has been reviewed, patient examined, no change in status, stable for surgery.  I have reviewed the patient's chart and labs.  Questions were answered to the patient's satisfaction.     Leotis Pain

## 2022-01-10 NOTE — Consult Note (Signed)
ANTICOAGULATION CONSULT NOTE - Follow Up Consult  Pharmacy Consult for heparin gtt Indication:  VTE Treatment  Allergies  Allergen Reactions   Codeine Nausea And Vomiting    Patient Measurements: Height: 5' 2.25" (158.1 cm) Weight: 64.9 kg (143 lb 1.3 oz) IBW/kg (Calculated) : 50.68 Heparin Dosing Weight: 63.8kg  Vital Signs: Temp: 98.4 F (36.9 C) (08/25 0451) Temp Source: Oral (08/25 0451) BP: 108/53 (08/25 0451) Pulse Rate: 82 (08/25 0451)  Labs: Recent Labs    01/09/22 1656 01/10/22 0312  HGB 12.6 11.2*  HCT 39.8 35.2*  PLT 235 183  APTT 29  --   LABPROT 13.6 15.2  INR 1.1 1.2  HEPARINUNFRC  --  0.75*  CREATININE 0.70 0.70     Estimated Creatinine Clearance: 55.8 mL/min (by C-G formula based on SCr of 0.7 mg/dL).   Medications: Heparin Dosing Weight: 63.8kg PTA: no AC or APT PTA Inpatient: +heparin gtt  Assessment: 71yo F w/ h/o Crohn's disease, GERD, MDD, HLD, hypothyroidism, MS, macular degeneration who presents for RLE swelling and pain diagnosed with acute DVT of RLE. No AC or APT noted prior to arrival. Planning thrombectomy tomorrow AM on 8/25. Pharmacy consulted for heparin gtt mgmt.  Date Time aPTT/HL Rate/Comment 8/25 0312 HL 0.75 Supratherapeutic  Baseline Labs: aPTT - pending INR - pending Hgb - 12.6 Plts - 235  Goal of Therapy:  Heparin level 0.3-0.7 units/ml Monitor platelets by anticoagulation protocol: Yes   Plan:  Decrease heparin infusion to 1000 units/hr Recheck HL in 8 hr after rate change. CBC daily while on heparin  Renda Rolls, PharmD, [facility] 01/10/2022 5:01 AM

## 2022-01-10 NOTE — Progress Notes (Signed)
End of shift note: 0700-1900  Pt had a right thrombectomy done today. She was post op at 1435. Right leg dressing is clean, dry and intact. Pt continues on IVF and a heparin drip. Family was updated after pt came back from surgery.

## 2022-01-10 NOTE — Progress Notes (Signed)
PACU RN just informed us pt is in recovery doing well. Pt will be coming back to the floor soon.

## 2022-01-10 NOTE — Progress Notes (Signed)
PROGRESS NOTE    Brooke Juarez   ATF:[id number] DOB: September 01, 1950  DOA: 01/09/2022 Date of Service: 01/10/22 PCP: Juline Patch, MD     Brief Narrative / Hospital Course:  Brooke Juarez is a 71 y.o. female with medical history significant of Crohn's disease, GERD, MDD, HLD, hypothyroidism, MS, macular degeneration who presents to ED from home on 01/09/22 for RLE swelling and pain.  She notes that the symptoms started "out of the blue" on Monday 08/21.  She had no preceding surgery, illness, long air or car travel.  08/24: [place] done in the ED showed extensive DVT RLE. Dr. Lucky Cowboy in Vascular was consulted and will plan to do thrombectomy tomorrow.  Heparin ggt started.  Patient does have MS which holds a risk for DVT, but the absolute risk is low.  08/25: Thrombectomy. Continuing heparin gtt   Consultants: Vascular Surgery  Procedures:  Thrombectomy RLE DVT 01/10/22    Assessment/Plan   DVT (deep venous thrombosis) (HCC) - Vascular, Dr. Lucky Cowboy consulted - thrombectomy today - Heparin drip per pharmacy --> po AC on discharge  - pain control with home percocet and morphine PRN - Nausea control with zofran - Ensure appropriate cancer screening on discharge with PCP   MS (multiple sclerosis) ([place]) Limited mobility with chronic pain & MS - Pain control as noted above - Possible risk factor for DVT given sedentary / unable to ambulate    Crohn's disease, not in flare - Continue sulfasalazine    Acid reflux - Continue protonix    Restless leg - Continue ropinirol and trazodone    Adult hypothyroidism - Continue levothyroxine    Tobacco abuse - Nicotine patch if needed - Counseling on cessation prior to discharge   Mild Leukocytosis - Likely related to acute issue above, trend    DVT prophylaxis: Heparin ggt  Code Status: Full  Family Communication:  husband at bedside on rounds Disposition Plan:              Patient is from [place] to Home              Anticipated DC date 01/11/22             Anticipated DC barriers Need to stay another night after thrombectomy    Consults called:       Dr. Lucky Cowboy Vascular  Admission status:    Med Surg, Obs            Subjective: Pt denies SOB/CP, LE swelling is stable, no other concerns at this time.     Objective: Vitals:   01/09/22 1959 01/10/22 0451 01/10/22 0751 01/10/22 1154  BP: 109/68 (!) 108/53 122/68 (!) 115/56  Pulse: 78 82 74 78  Resp: 17 16 18 14   Temp: 99 F (37.2 C) 98.4 F (36.9 C) 98.5 F (36.9 C) 98.3 F (36.8 C)  TempSrc: Oral Oral Oral Oral  SpO2: 99% 97% 96% 95%  Weight:      Height:        Intake/Output Summary (Last 24 hours) at 01/10/2022 1316 Last data filed at 01/10/2022 1300 Gross per 24 hour  Intake 767.89 ml  Output 70 ml  Net 697.89 ml   Filed Weights   01/09/22 1647  Weight: 64.9 kg    Examination:  Constitutional:  VS as above General Appearance: alert, well-developed, well-nourished, NAD Eyes: Normal lids and conjunctive, non-icteric sclera Ears, Nose, Mouth,  Throat: Normal external appearance MMM Neck: No masses, trachea midline Respiratory: Normal respiratory effort No wheeze No rhonchi No rales Cardiovascular: S1/S2 normal No no murmur No rub/gallop auscultated No JVD (+) R lower extremity edema Gastrointestinal: No tenderness No masses No hernia appreciated Musculoskeletal:  No clubbing/cyanosis of digits Symmetrical movement in all extremities Neurological: No cranial nerve deficit on limited exam Alert Psychiatric: Normal judgment/insight Normal mood and affect       Scheduled Medications:   fentaNYL       [MAR Hold] gabapentin  600 mg Oral BID   heparin sodium (porcine)       [MAR Hold] levothyroxine  112 mcg Oral Daily   midazolam       [MAR Hold] montelukast  10 mg Oral QHS   [MAR Hold] oxyCODONE-acetaminophen  0.5 tablet Oral BID   [MAR Hold] pantoprazole  40 mg Oral Daily   [MAR Hold]  rOPINIRole  2 mg Oral QHS   [MAR Hold] sodium chloride flush  3 mL Intravenous Q12H   [MAR Hold] sulfaSALAzine  500 mg Oral BID   [MAR Hold] traZODone  100 mg Oral QHS    Continuous Infusions:  sodium chloride 75 mL/hr at 01/10/22 0916   ceFAZolin      ceFAZolin (ANCEF) IV      ceFAZolin (ANCEF) IV     heparin Stopped (01/10/22 1202)    PRN Medications:  ceFAZolin, ceFAZolin (ANCEF) IV, diphenhydrAMINE, famotidine, fentaNYL, fentaNYL, heparin sodium (porcine), heparin sodium (porcine), [MAR Hold]  HYDROmorphone (DILAUDID) injection, iodixanol, [MAR Hold] methocarbamol, methylPREDNISolone (SOLU-MEDROL) injection, midazolam, midazolam, midazolam, [MAR Hold]  morphine injection, [MAR Hold] ondansetron **OR** [MAR Hold] ondansetron (ZOFRAN) IV  Antimicrobials:  Anti-infectives (From admission, onward)    Start     Dose/Rate Route Frequency Ordered Stop   01/10/22 1230  ceFAZolin (ANCEF) IVPB 1 g/50 mL premix        over 30 Minutes  Continuous PRN 01/10/22 1231     01/10/22 1118  ceFAZolin (ANCEF) 2-4 GM/100ML-% IVPB       Note to Pharmacy: Brooke Juarez: cabinet override      01/10/22 1118 01/10/22 2329   01/10/22 1110  ceFAZolin (ANCEF) IVPB 2g/100 mL premix        2 g 200 mL/hr over 30 Minutes Intravenous 30 min pre-op 01/10/22 1110         Data Reviewed: I have personally reviewed following labs and imaging studies  CBC: Recent Labs  Lab 01/09/22 1656 01/10/22 0312  WBC 11.8* 8.0  NEUTROABS 9.4*  --   HGB 12.6 11.2*  HCT 39.8 35.2*  MCV 90.2 89.3  PLT 235 397   Basic Metabolic Panel: Recent Labs  Lab 01/09/22 1656 01/10/22 0312  NA 139 138  K 3.6 3.5  CL 103 107  CO2 24 24  GLUCOSE 118* 112*  BUN 9 8  CREATININE 0.70 0.70  CALCIUM 9.1 8.1*   GFR: Estimated Creatinine Clearance: 55.8 mL/min (by C-G formula based on SCr of 0.7 mg/dL). Liver Function Tests: Recent Labs  Lab 01/09/22 1656  AST 18  ALT 13  ALKPHOS 126  BILITOT 1.2  PROT 6.6   ALBUMIN 3.9   Recent Labs  Lab 01/09/22 1656  LIPASE 34   No results for input(s): "AMMONIA" in the last 168 hours. Coagulation Profile: Recent Labs  Lab 01/09/22 1656 01/10/22 0312  INR 1.1 1.2   Cardiac Enzymes: No results for input(s): "CKTOTAL", "CKMB", "CKMBINDEX", "TROPONINI" in the last 168 hours. BNP (last 3  results) No results for input(s): "PROBNP" in the last 8760 hours. HbA1C: No results for input(s): "HGBA1C" in the last 72 hours. CBG: No results for input(s): "GLUCAP" in the last 168 hours. Lipid Profile: No results for input(s): "CHOL", "HDL", "LDLCALC", "TRIG", "CHOLHDL", "LDLDIRECT" in the last 72 hours. Thyroid Function Tests: No results for input(s): "TSH", "T4TOTAL", "FREET4", "T3FREE", "THYROIDAB" in the last 72 hours. Anemia Panel: No results for input(s): "VITAMINB12", "FOLATE", "FERRITIN", "TIBC", "IRON", "RETICCTPCT" in the last 72 hours. Urine analysis: No results found for: "COLORURINE", "APPEARANCEUR", "LABSPEC", "PHURINE", "GLUCOSEU", "HGBUR", "BILIRUBINUR", "KETONESUR", "PROTEINUR", "UROBILINOGEN", "NITRITE", "LEUKOCYTESUR" Sepsis Labs: @LABRCNTIP (procalcitonin:4,lacticidven:4)  No results found for this or any previous visit (from the past 240 hour(s)).       Radiology Studies: US Venous Img Lower Right (DVT Study)  Result Date: 01/09/2022 CLINICAL DATA:  Right leg pain and swelling since Monday. EXAM: RIGHT LOWER EXTREMITY VENOUS DOPPLER ULTRASOUND TECHNIQUE: Gray-scale sonography with compression, as well as color and duplex ultrasound, were performed to evaluate the deep venous system(s) from the level of the common femoral vein through the popliteal and proximal calf veins. COMPARISON:  None Available. FINDINGS: VENOUS Acute occlusive thrombus involving the right common femoral vein, right saphenofemoral junction, right profunda femoral vein, right femoral vein, right popliteal vein, and right posterior tibial vein. The peroneal vein is  not well visualized. Limited views of the contralateral common femoral vein are unremarkable. OTHER None. Limitations: None. IMPRESSION: 1. Extensive acute occlusive DVT throughout the right leg extending from the common femoral vein into the posterior tibial vein. Electronically Signed   By: Titus Dubin M.D.   On: 01/09/2022 13:31            LOS: 0 days     Emeterio Reeve, DO [facility] 01/10/2022, 1:16 PM   Staff may message me via secure chat in [place]  but this may not receive immediate response,  please page for urgent matters!  If 7PM-7AM, please contact night-coverage [email or web address]  Dictation software was used to generate the above note. Typos may occur and escape review, as with typed/written notes. Please contact Dr Sheppard Coil directly for clarity if needed.

## 2022-01-10 NOTE — Op Note (Signed)
[facility]   OPERATIVE NOTE  PRE-OPERATIVE DIAGNOSIS: extensive RLE DVT  POST-OPERATIVE DIAGNOSIS: same   PROCEDURE: 1.   US guidance for vascular access to right popliteal vein 2.   Catheter placement into right iliac vein from right popliteal approach 3.   IVC gram and right lower extremity venogram 4.   Mechanical thrombectomy to right popliteal vein, femoral vein, common femoral vein, and iliac veins with the penumbra CAT 12 device     SURGEON: Leotis Pain, MD  ASSISTANT(S): none  ANESTHESIA: local with moderate conscious sedation for 34 minutes using 2 mg of Versed and 50 mcg of Fentanyl  ESTIMATED BLOOD LOSS: 75 cc  FINDING(S): 1.  Extensive thrombus throughout the left popliteal, femoral, common femoral, and external iliac veins with some thrombus up into the common iliac vein but the IVC was patent.  SPECIMEN(S):  none  INDICATIONS:    Patient is a 71 y.o. female who presents with right lower extremity DVT that was very extensive.  Patient has marked leg swelling and pain.  Venous intervention is performed to reduce the symtpoms and avoid long term postphlebitic symptoms.    DESCRIPTION: After obtaining full informed written consent, the patient was brought back to the vascular suite and placed supine upon the table. Moderate conscious sedation was administered during a face to face encounter with the patient throughout the procedure with my supervision of the RN administering medicines and monitoring the patient's vital signs, pulse oximetry, telemetry and mental status throughout from the start of the procedure until the patient was taken to the recovery room.  After obtaining adequate anesthesia, the patient was prepped and draped in the standard fashion.    The patient was then placed into the prone position.  The right popliteal vein was then accessed under direct ultrasound guidance without difficulty with a micropuncture needle and a permanent  image was recorded.  I then upsized to an 11 Fr sheath over a J wire.  3000 units of heparin were then given.  Imaging showed extensive DVT with minimal flow.  I advanced a penumbra CAT 12 catheter up into the iliac veins which showed thrombus in the right external iliac vein with some extension into the common iliac vein with the IVC was patent.  I used the Penumbra Cat 12 catheter and evacuated about 75 cc of effluent with mechanical thrombectomy throughout the iliac veins, CFV, SFV, and popliteal vein.  Several passes were made with an extremely large volume of mostly acute but some more chronic appearing thrombus removed.  This had marked improvement with only a small amount of residual thrombus in the common femoral vein and then down at the access site in the popliteal vein.  I then elected to terminate the procedure.  The sheath was removed and a dressing was placed.  She was taken to the recovery room in stable condition having tolerated the procedure well.    COMPLICATIONS: None  CONDITION: Stable  Leotis Pain 01/10/2022 1:09 PM

## 2022-01-10 NOTE — Consult Note (Signed)
[facility] V[facility] Consult Note  MRN : [id number]  Brooke Juarez is a 71 y.o. (24-Apr-1951) female who presents with chief complaint of  Chief Complaint  Patient presents with   Leg Pain    Right  .   Consulting Physician:Emily Daryll Drown, MD Reason for consult: Deep vein thrombosis History of Present Illness: Brooke Juarez is a 71 year old female with a previous medical history including multiple sclerosis, hyperlipidemia, macular degeneration, Crohn's disease who presented due to having right lower extremity swelling.  She notes that this began on Monday.  It slowly worsened and she began to have significant pain associated with the swelling.  She denies any recent travel.  She denies any recent surgeries.  She does note that with her multiple sclerosis she is not very mobile.  Noninvasive studies today show an extensive right lower extremity DVT.  Current Facility-Administered Medications  Medication Dose Route Frequency Provider Last Rate Last Admin   0.9 %  sodium chloride infusion   Intravenous Continuous Algernon Huxley, MD 75 mL/hr at 01/10/22 2109 New Bag at 01/10/22 2109   ceFAZolin (ANCEF) 2-4 GM/100ML-% IVPB            ceFAZolin (ANCEF) IVPB 2g/100 mL premix  2 g Intravenous 30 min Pre-Op Algernon Huxley, MD       diphenhydrAMINE (BENADRYL) injection 50 mg  50 mg Intravenous Once PRN Algernon Huxley, MD       famotidine (PEPCID) tablet 40 mg  40 mg Oral Once PRN Algernon Huxley, MD       fentaNYL (SUBLIMAZE) 100 MCG/2ML injection            gabapentin (NEURONTIN) capsule 600 mg  600 mg Oral BID Algernon Huxley, MD   600 mg at 01/10/22 2106   heparin ADULT infusion 100 units/mL (25000 units/271m)  1,000 Units/hr Intravenous Continuous DAlgernon Huxley MD 10 mL/hr at 01/10/22 1429 1,000 Units/hr at 01/10/22 1429   heparin sodium (porcine) 1000 UNIT/ML injection            HYDROmorphone (DILAUDID) injection 1 mg  1 mg Intravenous Once PRN DAlgernon Huxley MD       levothyroxine  (SYNTHROID) tablet 112 mcg  112 mcg Oral Daily Dew, JErskine Squibb MD       methocarbamol (ROBAXIN) tablet 500 mg  500 mg Oral Q6H PRN DAlgernon Huxley MD       methylPREDNISolone sodium succinate (SOLU-MEDROL) 125 mg/2 mL injection 125 mg  125 mg Intravenous Once PRN DAlgernon Huxley MD       midazolam (VERSED) 2 MG/ML syrup 8 mg  8 mg Oral Once PRN DAlgernon Huxley MD       midazolam (VERSED) 5 MG/5ML injection            montelukast (SINGULAIR) tablet 10 mg  10 mg Oral QHS DAlgernon Huxley MD   10 mg at 01/10/22 2106   morphine (PF) 2 MG/ML injection 2 mg  2 mg Intravenous Q2H PRN DAlgernon Huxley MD       ondansetron (ZOFRAN) tablet 4 mg  4 mg Oral Q6H PRN DAlgernon Huxley MD       Or   ondansetron (ZOFRAN) injection 4 mg  4 mg Intravenous Q6H PRN DAlgernon Huxley MD       oxyCODONE-acetaminophen (PERCOCET/ROXICET) 5-325 MG per tablet 0.5 tablet  0.5 tablet Oral BID DAlgernon Huxley MD   0.5 tablet at 01/10/22 2106  pantoprazole (PROTONIX) EC tablet 40 mg  40 mg Oral Daily Algernon Huxley, MD   40 mg at 01/09/22 2125   rOPINIRole (REQUIP) tablet 2 mg  2 mg Oral QHS Algernon Huxley, MD   2 mg at 01/10/22 2106   sodium chloride flush (NS) 0.9 % injection 3 mL  3 mL Intravenous Q12H Algernon Huxley, MD   3 mL at 01/09/22 2126   sulfaSALAzine (AZULFIDINE) EC tablet 500 mg  500 mg Oral BID Algernon Huxley, MD   500 mg at 01/10/22 2106   traZODone (DESYREL) tablet 100 mg  100 mg Oral QHS Algernon Huxley, MD   100 mg at 01/10/22 2106    Past Medical History:  Diagnosis Date   Arthritis    Crohn disease ([place])    Depression    GERD (gastroesophageal reflux disease)    Headache    Migraines   Heart murmur    Hyperlipidemia    Hypothyroidism    Macular degeneration    MS (multiple sclerosis) ([place])    Restless leg    Seizures ([place])    Tremors of nervous system     Past Surgical History:  Procedure Laterality Date   APPENDECTOMY Left 04/14/2018   Procedure: APPENDECTOMY;  Surgeon: Olean Ree, MD;  Location: [facility] ORS;  Service:  General;  Laterality: Left;   CATARACT EXTRACTION W/PHACO Right 10/30/2014   Procedure: CATARACT EXTRACTION PHACO AND INTRAOCULAR LENS PLACEMENT ([place]);  Surgeon: Estill Cotta, MD;  Location: [facility] ORS;  Service: Ophthalmology;  Laterality: Right;  [place]  1:55.7    CATARACT EXTRACTION W/PHACO Left 06/18/2015   Procedure: CATARACT EXTRACTION PHACO AND INTRAOCULAR LENS PLACEMENT ([facility]);  Surgeon: Estill Cotta, MD;  Location: [facility] ORS;  Service: Ophthalmology;  Laterality: Left;  [place] 01:36 AP% 26.1 CDE 42.19 fluid pack lo0t # [id number]   COLONOSCOPY  2014   normal- Rein- repeat in 5 years   COLONOSCOPY WITH PROPOFOL N/A 07/09/2020   Procedure: COLONOSCOPY WITH PROPOFOL;  Surgeon: Toledo, Benay Pike, MD;  Location: [facility] ENDOSCOPY;  Service: Gastroenterology;  Laterality: N/A;   EYE SURGERY     FOOT SURGERY     INGUINAL HERNIA REPAIR Right 04/14/2018   Procedure: HERNIA REPAIR INGUINAL;  Surgeon: Olean Ree, MD;  Location: [facility] ORS;  Service: General;  Laterality: Right;   NASAL SINUS SURGERY     TONSILLECTOMY     TUBAL LIGATION     VAGINAL HYSTERECTOMY      Social History Social History   Tobacco Use   Smoking status: Every Day    Packs/day: 0.75    Years: 55.00    Total pack years: 41.25    Types: Cigarettes   Smokeless tobacco: Never  Vaping Use   Vaping Use: Never used  Substance Use Topics   Alcohol use: No    Alcohol/week: 0.0 standard drinks of alcohol   Drug use: No    Family History Family History  Problem Relation Age of Onset   Heart disease Mother    Stroke Mother    Cancer Mother        colon   Mitral valve prolapse Mother    Stroke Father    Breast cancer Neg Hx     Allergies  Allergen Reactions   Codeine Nausea And Vomiting     REVIEW OF SYSTEMS (Negative unless checked)  Constitutional: [] Weight loss  [] Fever  [] Chills Cardiac: [] Chest pain   [] Chest pressure   [] Palpitations   [] Shortness of breath when  laying flat   [] Shortness of breath at  rest   [] Shortness of breath with exertion. Vascular:  [] Pain in legs with walking   [] Pain in legs at rest   [] Pain in legs when laying flat   [] Claudication   [] Pain in feet when walking  [] Pain in feet at rest  [] Pain in feet when laying flat   [] History of DVT   [] Phlebitis   [x] Swelling in legs   [] Varicose veins   [] Non-healing ulcers Pulmonary:   [] Uses home oxygen   [] Productive cough   [] Hemoptysis   [] Wheeze  [] COPD   [] Asthma Neurologic:  [] Dizziness  [] Blackouts   [] Seizures   [] History of stroke   [] History of TIA  [] Aphasia   [] Temporary blindness   [] Dysphagia   [] Weakness or numbness in arms   [] Weakness or numbness in legs Musculoskeletal:  [] Arthritis   [] Joint swelling   [] Joint pain   [] Low back pain Hematologic:  [] Easy bruising  [] Easy bleeding   [] Hypercoagulable state   [] Anemic  [] Hepatitis Gastrointestinal:  [] Blood in stool   [] Vomiting blood  [] Gastroesophageal reflux/heartburn   [] Difficulty swallowing. Genitourinary:  [] Chronic kidney disease   [] Difficult urination  [] Frequent urination  [] Burning with urination   [] Blood in urine Skin:  [] Rashes   [] Ulcers   [] Wounds Psychological:  [] History of anxiety   []  History of major depression.  Physical Examination  Vitals:   01/10/22 1430 01/10/22 1438 01/10/22 1516 01/10/22 1937  BP: (!) 92/49 (!) 97/59 (!) 100/53 118/86  Pulse: 77  79 86  Resp: 20  18 20   Temp: 98.2 F (36.8 C)  99.3 F (37.4 C) 99.2 F (37.3 C)  TempSrc: Oral  Oral Oral  SpO2: 98%  98% 98%  Weight:      Height:       Body mass index is 25.96 kg/m. Gen:  WD/WN, NAD Head: [place]/AT, No temporalis wasting. Prominent temp pulse not noted. Ear/Nose/Throat: Hearing grossly intact, nares w/o erythema or drainage, oropharynx w/o Erythema/Exudate Eyes: Sclera non-icteric, conjunctiva clear Neck: Trachea midline.  No JVD.  Pulmonary:  Good air movement, respirations not labored, equal bilaterally.  Cardiac: RRR, normal S1, S2. Vascular: 3+ right  lower extremity edema  Gastrointestinal: soft, non-tender/non-distended. No guarding/reflex.  Musculoskeletal: M/S 5/5 throughout.  Extremities without ischemic changes.  No deformity or atrophy. Neurologic: Sensation grossly intact in extremities.  Symmetrical.  Speech is fluent. Motor exam as listed above. Psychiatric: Judgment intact, Mood & affect appropriate for pt's clinical situation. Dermatologic: No rashes or ulcers noted.  No cellulitis or open wounds. Lymph : No Cervical, Axillary, or Inguinal lymphadenopathy.    CBC Lab Results  Component Value Date   WBC 8.0 01/10/2022   HGB 11.2 (L) 01/10/2022   HCT 35.2 (L) 01/10/2022   MCV 89.3 01/10/2022   PLT 183 01/10/2022    BMET    Component Value Date/Time   NA 138 01/10/2022 0312   NA 145 (H) 06/03/2021 0902   K 3.5 01/10/2022 0312   CL 107 01/10/2022 0312   CO2 24 01/10/2022 0312   GLUCOSE 112 (H) 01/10/2022 0312   BUN 8 01/10/2022 0312   BUN 9 06/03/2021 0902   CREATININE 0.70 01/10/2022 0312   CALCIUM 8.1 (L) 01/10/2022 0312   GFRNONAA >60 01/10/2022 0312   GFRAA 77 06/15/2020 0944   Estimated Creatinine Clearance: 55.8 mL/min (by C-G formula based on SCr of 0.7 mg/dL).  COAG Lab Results  Component Value Date   INR 1.2 01/10/2022  INR 1.1 01/09/2022    Radiology PERIPHERAL VASCULAR CATHETERIZATION  Result Date: 01/10/2022 See surgical note for result.  US Venous Img Lower Right (DVT Study)  Result Date: 01/09/2022 CLINICAL DATA:  Right leg pain and swelling since Monday. EXAM: RIGHT LOWER EXTREMITY VENOUS DOPPLER ULTRASOUND TECHNIQUE: Gray-scale sonography with compression, as well as color and duplex ultrasound, were performed to evaluate the deep venous system(s) from the level of the common femoral vein through the popliteal and proximal calf veins. COMPARISON:  None Available. FINDINGS: VENOUS Acute occlusive thrombus involving the right common femoral vein, right saphenofemoral junction, right  profunda femoral vein, right femoral vein, right popliteal vein, and right posterior tibial vein. The peroneal vein is not well visualized. Limited views of the contralateral common femoral vein are unremarkable. OTHER None. Limitations: None. IMPRESSION: 1. Extensive acute occlusive DVT throughout the right leg extending from the common femoral vein into the posterior tibial vein. Electronically Signed   By: Titus Dubin M.D.   On: 01/09/2022 13:31      Assessment/Plan 1.  Deep vein thrombosis  I have discussed thrombectomy with the patient and given her extensive swelling and pain and discomfort.  This will likely help her recover faster and decrease postphlebitic symptoms.  We discussed the risk benefits and alternatives.  The patient is agreeable to proceed. 2.  Multiple sclerosis Patient notes that she is not very mobile due to her multiple sclerosis.  This may be a causative factor in her DVT.   Family Communication: Husband at bedside  Thank you for allowing [place] to participate in the care of this patient.   Kris Hartmann, NP [facility] [phone] (Office Phone) [phone] (Office Fax) [phone] (Pager)  01/10/2022 9:29 PM  Staff may message me via secure chat in [place]  but this may not receive immediate response,  please page for urgent matters!  Dictation software was used to generate the above note. Typos may occur and escape review, as with typed/written notes. Any error is purely unintentional.  Please contact me directly for clarity if needed.

## 2022-01-10 NOTE — Hospital Course (Addendum)
Brooke Juarez is a 71 y.o. female with medical history significant of Crohn's disease, GERD, MDD, HLD, hypothyroidism, MS, macular degeneration who presents to ED from home on 01/09/22 for RLE swelling and pain.  She notes that the symptoms started "out of the blue" on Monday 08/21.  She had no preceding surgery, illness, long air or car travel.  08/24: [place] done in the ED showed extensive DVT RLE. Dr. Lucky Cowboy in Vascular was consulted and will plan to do thrombectomy tomorrow.  Heparin ggt started.  Patient does have MS which holds a risk for DVT, but the absolute risk is low.  08/25: Thrombectomy. Continuing heparin gtt 08/26: doing well, heparin --> Eliquis, ok for discharge home    Consultants: Vascular Surgery  Procedures:  Thrombectomy RLE DVT 01/10/22    Assessment/Plan   DVT (deep venous thrombosis) ([place]) - Vascular, Dr. Lucky Cowboy consulted - thrombectomy 01/10/22 - Heparin drip per pharmacy --> po Eliquis on discharge  - pain control with home percocet and morphine PRN - Nausea control with zofran - Ensure appropriate cancer screening on discharge with PCP   MS (multiple sclerosis) ([place]) Limited mobility with chronic pain & MS - Pain control as noted above - Possible risk factor for DVT given sedentary / unable to ambulate    Crohn's disease, not in flare - Continue sulfasalazine    Acid reflux - Continue protonix    Restless leg - Continue ropinirol and trazodone    Adult hypothyroidism - Continue levothyroxine    Tobacco abuse - Nicotine patch if needed - Counseling on cessation prior to discharge   Mild Leukocytosis - Likely related to acute issue above, trend

## 2022-01-11 DIAGNOSIS — E039 Hypothyroidism, unspecified: Secondary | ICD-10-CM | POA: Diagnosis not present

## 2022-01-11 DIAGNOSIS — Z7409 Other reduced mobility: Secondary | ICD-10-CM | POA: Diagnosis not present

## 2022-01-11 DIAGNOSIS — K219 Gastro-esophageal reflux disease without esophagitis: Secondary | ICD-10-CM | POA: Diagnosis not present

## 2022-01-11 DIAGNOSIS — I82411 Acute embolism and thrombosis of right femoral vein: Secondary | ICD-10-CM | POA: Diagnosis not present

## 2022-01-11 LAB — BASIC METABOLIC PANEL
Anion gap: 7 (ref 5–15)
BUN: 8 mg/dL (ref 8–23)
CO2: 21 mmol/L — ABNORMAL LOW (ref 22–32)
Calcium: 7.8 mg/dL — ABNORMAL LOW (ref 8.9–10.3)
Chloride: 112 mmol/L — ABNORMAL HIGH (ref 98–111)
Creatinine, Ser: 0.71 mg/dL (ref 0.44–1.00)
GFR, Estimated: >60 mL/min (ref >60)
Glucose, Bld: 82 mg/dL (ref 70–99)
Potassium: 3.2 mmol/L — ABNORMAL LOW (ref 3.5–5.1)
Sodium: 140 mmol/L (ref 135–145)

## 2022-01-11 LAB — CBC
HCT: 30.8 % — ABNORMAL LOW (ref 36.0–46.0)
Hemoglobin: 9.7 g/dL — ABNORMAL LOW (ref 12.0–15.0)
MCH: 28.6 pg (ref 26.0–34.0)
MCHC: 31.5 g/dL (ref 30.0–36.0)
MCV: 90.9 fL (ref 80.0–100.0)
Platelets: 170 10*3/uL (ref 150–400)
RBC: 3.39 MIL/uL — ABNORMAL LOW (ref 3.87–5.11)
RDW: 13.4 % (ref 11.5–15.5)
WBC: 7 10*3/uL (ref 4.0–10.5)
nRBC: 0 % (ref 0.0–0.2)

## 2022-01-11 LAB — HEPARIN LEVEL (UNFRACTIONATED): Heparin Unfractionated: 0.34 IU/mL (ref 0.30–0.70)

## 2022-01-11 MED ORDER — APIXABAN 5 MG PO TABS
10.0000 mg | ORAL_TABLET | Freq: Two times a day (BID) | ORAL | Status: DC
  Administered 2022-01-11: 10 mg via ORAL
  Filled 2022-01-11: qty 2

## 2022-01-11 MED ORDER — APIXABAN 5 MG PO TABS: 10.0000 mg | ORAL_TABLET | Freq: Two times a day (BID) | ORAL | 0 refills | Status: DC

## 2022-01-11 MED ORDER — APIXABAN 5 MG PO TABS: 5.0000 mg | ORAL_TABLET | Freq: Two times a day (BID) | ORAL | 0 refills | Status: DC

## 2022-01-11 MED ORDER — POTASSIUM CHLORIDE CRYS ER 20 MEQ PO TBCR
40.0000 meq | EXTENDED_RELEASE_TABLET | Freq: Once | ORAL | Status: AC
  Administered 2022-01-11: 40 meq via ORAL
  Filled 2022-01-11: qty 2

## 2022-01-11 MED ORDER — APIXABAN 5 MG PO TABS: 5.0000 mg | ORAL_TABLET | Freq: Two times a day (BID) | ORAL | Status: DC

## 2022-01-11 NOTE — Consult Note (Signed)
ANTICOAGULATION CONSULT NOTE - Follow Up Consult  Pharmacy Consult for heparin gtt Indication:  VTE Treatment  Allergies  Allergen Reactions   Codeine Nausea And Vomiting    Patient Measurements: Height: 5' 2.25" (158.1 cm) Weight: 64.9 kg (143 lb 1.3 oz) IBW/kg (Calculated) : 50.68 Heparin Dosing Weight: 63.8kg  Vital Signs: Temp: 99.2 F (37.3 C) (08/25 1937) Temp Source: Oral (08/25 1937) BP: 118/86 (08/25 1937) Pulse Rate: 86 (08/25 1937)  Labs: Recent Labs    01/09/22 1656 01/10/22 0312 01/10/22 2240  HGB 12.6 11.2*  --   HCT 39.8 35.2*  --   PLT 235 183  --   APTT 29  --   --   LABPROT 13.6 15.2  --   INR 1.1 1.2  --   HEPARINUNFRC  --  0.75* 0.37  CREATININE 0.70 0.70  --      Estimated Creatinine Clearance: 55.8 mL/min (by C-G formula based on SCr of 0.7 mg/dL).   Medications: Heparin Dosing Weight: 63.8kg PTA: no AC or APT PTA Inpatient: +heparin gtt  Assessment: 71yo F w/ h/o Crohn's disease, GERD, MDD, HLD, hypothyroidism, MS, macular degeneration who presents for RLE swelling and pain diagnosed with acute DVT of RLE. No AC or APT noted prior to arrival. Planning thrombectomy tomorrow AM on 8/25. Pharmacy consulted for heparin gtt mgmt.  Date Time aPTT/HL Rate/Comment 8/25 0312 HL 0.75 Supratherapeutic 8/25 2240 HL 0.37 Therapeutic x 1  Baseline Labs: aPTT - pending INR - pending Hgb - 12.6 Plts - 235  Goal of Therapy:  Heparin level 0.3-0.7 units/ml Monitor platelets by anticoagulation protocol: Yes   Plan:  Continue heparin infusion at 1000 units/hr Recheck HL in w/ AM labs to confirm CBC daily while on heparin  Renda Rolls, PharmD, [facility] 01/11/2022 12:14 AM

## 2022-01-11 NOTE — Discharge Summary (Signed)
Physician Discharge Summary   Patient: Brooke Juarez MRN: [id number]  DOB: 23-Aug-1950   Admit:     Date of Admission: 01/09/2022 Admitted from: home   Discharge: Date of discharge: 01/11/22 Disposition: Home Condition at discharge: good  CODE STATUS: FULL     Discharge Physician: Emeterio Reeve, DO [facility]     PCP: Juline Patch, MD  Recommendations for Outpatient Follow-up:  Follow up with PCP Juline Patch, MD in 1-2 weeks Please obtain labs/tests: CBC, BMP in 1-2 weeks  Please follow up on the following pending results: none PCP AND OTHER OUTPATIENT PROVIDERS: SEE BELOW FOR SPECIFIC DISCHARGE INSTRUCTIONS PRINTED FOR PATIENT IN ADDITION TO GENERIC AVS PATIENT INFO   Discharge Instructions     Diet - low sodium heart healthy   Complete by: As directed    Discharge instructions   Complete by: As directed    Please follow up with vascular surgery as directed.   Discharge wound care:   Complete by: As directed    Keep site clean and dry, bandaged.   Increase activity slowly   Complete by: As directed           Discharge Diagnoses: Principal Problem:   DVT (deep venous thrombosis) (HCC) Active Problems:   Acid reflux   Restless leg   Adult hypothyroidism   MS (multiple sclerosis) (HCC)   Limited mobility with chronic pain & MS   Tobacco abuse    Hospital Course: Brooke Juarez is a 71 y.o. female with medical history significant of Crohn's disease, GERD, MDD, HLD, hypothyroidism, MS, macular degeneration who presents to ED from home on 01/09/22 for RLE swelling and pain.  She notes that the symptoms started "out of the blue" on Monday 08/21.  She had no preceding surgery, illness, long air or car travel.  08/24: [place] done in the ED showed extensive DVT RLE. Dr. Lucky Cowboy in Vascular was consulted and will plan to do thrombectomy tomorrow.  Heparin ggt started.  Patient does have MS which holds a risk for DVT, but the absolute risk is low.   08/25: Thrombectomy. Continuing heparin gtt 08/26: doing well, heparin --> Eliquis, ok for discharge home    Consultants: Vascular Surgery  Procedures:  Thrombectomy RLE DVT 01/10/22    Assessment/Plan   DVT (deep venous thrombosis) ([place]) - Vascular, Dr. Lucky Cowboy consulted - thrombectomy 01/10/22 - Heparin drip per pharmacy --> po Eliquis on discharge  - pain control with home percocet and morphine PRN - Nausea control with zofran - Ensure appropriate cancer screening on discharge with PCP   MS (multiple sclerosis) ([place]) Limited mobility with chronic pain & MS - Pain control as noted above - Possible risk factor for DVT given sedentary / unable to ambulate    Crohn's disease, not in flare - Continue sulfasalazine    Acid reflux - Continue protonix    Restless leg - Continue ropinirol and trazodone    Adult hypothyroidism - Continue levothyroxine    Tobacco abuse - Nicotine patch if needed - Counseling on cessation prior to discharge   Mild Leukocytosis - Likely related to acute issue above, trend            Discharge Instructions  Allergies as of 01/11/2022       Reactions   Codeine Nausea And Vomiting        Medication List     STOP taking these medications    acetaminophen 500 MG tablet Commonly known as:  TYLENOL       TAKE these medications    alendronate 70 MG tablet Commonly known as: FOSAMAX TAKE 1 TABLET BY MOUTH ONCE A WEEK ON AN EMPTY STOMACH WITH  A  FULL  GLASS  OF  WATER   apixaban 5 MG Tabs tablet Commonly known as: ELIQUIS Take 2 tablets (10 mg total) by mouth 2 (two) times daily for 7 days.   apixaban 5 MG Tabs tablet Commonly known as: ELIQUIS Take 1 tablet (5 mg total) by mouth 2 (two) times daily. Start taking on: January 18, 2022   cholecalciferol 10 MCG (400 UNIT) Tabs tablet Commonly known as: VITAMIN D3 Take 400 Units by mouth.   dronabinol 2.5 MG capsule Commonly known as: MARINOL Take 2.5 mg by mouth  daily as needed.   Fish Oil 1000 MG Caps Take by mouth.   gabapentin 300 MG capsule Commonly known as: NEURONTIN Take 1 capsule by mouth 4 (four) times daily. [name]   levothyroxine 112 MCG tablet Commonly known as: Euthyrox Take 1 tablet (112 mcg total) by mouth daily.   lidocaine 5 % Commonly known as: LIDODERM SMARTSIG:Topical   methocarbamol 500 MG tablet Commonly known as: ROBAXIN SMARTSIG:0.5 Tablet(s) By Mouth Every 12 Hours PRN   montelukast 10 MG tablet Commonly known as: SINGULAIR Take 1 tablet (10 mg total) by mouth at bedtime.   oxyCODONE-acetaminophen 5-325 MG tablet Commonly known as: PERCOCET/ROXICET SMARTSIG:0.5 Tablet(s) By Mouth Every 12 Hours PRN   pantoprazole 40 MG tablet Commonly known as: PROTONIX Take 1 tablet (40 mg total) by mouth daily.   potassium chloride 10 MEQ tablet Commonly known as: KLOR-CON TAKE 1 TABLET BY MOUTH 3 TIMES PER WEEK   PRESERVISION AREDS 2 PO Take 2 capsules by mouth daily.   rOPINIRole 1 MG tablet Commonly known as: REQUIP Take 2 tablets by mouth at bedtime.   sulfaSALAzine 500 MG EC tablet Commonly known as: AZULFIDINE Take 500 mg by mouth 2 (two) times daily.   traZODone 100 MG tablet Commonly known as: DESYREL Take 1 tablet (100 mg total) by mouth daily.   vitamin B-12 500 MCG tablet Commonly known as: CYANOCOBALAMIN Take 500 mcg by mouth every 14 (fourteen) days.               Discharge Care Instructions  (From admission, onward)           Start     Ordered   01/11/22 0000  Discharge wound care:       Comments: Keep site clean and dry, bandaged.   01/11/22 1333             Follow-up Information     Dew, Erskine Squibb, MD. Call in 2 day(s).   Specialties: Vascular Surgery, Radiology, Interventional Cardiology Why: postoperative follow up from thrombectomy done 01/10/22 for DVT Contact information: [address] [place] [address] [phone]                 Allergies   Allergen Reactions   Codeine Nausea And Vomiting     Subjective: pt feeling well today, no LE pain, swelling is improving    Discharge Exam: BP (!) 97/59 (BP Location: Left Arm)   Pulse 74   Temp 98.2 F (36.8 C) (Oral)   Resp 18   Ht 5' 2.25" (1.581 m)   Wt 64.9 kg   LMP  (LMP Unknown) Comment: had hysterectomy  SpO2 93%   BMI 25.96 kg/m  General: Pt is alert, awake, not  in acute distress Cardiovascular: RRR, S1/S2 +, no rubs, no gallops Respiratory: CTA bilaterally, no wheezing, no rhonchi Abdominal: Soft, NT, ND, bowel sounds + Extremities: no edema, no cyanosis     The results of significant diagnostics from this hospitalization (including imaging, microbiology, ancillary and laboratory) are listed below for reference.     Microbiology: No results found for this or any previous visit (from the past 240 hour(s)).   Labs: BNP (last 3 results) No results for input(s): "BNP" in the last 8760 hours. Basic Metabolic Panel: Recent Labs  Lab 01/09/22 1656 01/10/22 0312 01/11/22 0507  NA 139 138 140  K 3.6 3.5 3.2*  CL 103 107 112*  CO2 24 24 21*  GLUCOSE 118* 112* 82  BUN 9 8 8   CREATININE 0.70 0.70 0.71  CALCIUM 9.1 8.1* 7.8*   Liver Function Tests: Recent Labs  Lab 01/09/22 1656  AST 18  ALT 13  ALKPHOS 126  BILITOT 1.2  PROT 6.6  ALBUMIN 3.9   Recent Labs  Lab 01/09/22 1656  LIPASE 34   No results for input(s): "AMMONIA" in the last 168 hours. CBC: Recent Labs  Lab 01/09/22 1656 01/10/22 0312 01/11/22 0507  WBC 11.8* 8.0 7.0  NEUTROABS 9.4*  --   --   HGB 12.6 11.2* 9.7*  HCT 39.8 35.2* 30.8*  MCV 90.2 89.3 90.9  PLT 235 183 170   Cardiac Enzymes: No results for input(s): "CKTOTAL", "CKMB", "CKMBINDEX", "TROPONINI" in the last 168 hours. BNP: Invalid input(s): "POCBNP" CBG: No results for input(s): "GLUCAP" in the last 168 hours. D-Dimer No results for input(s): "DDIMER" in the last 72 hours. Hgb A1c No results for input(s):  "HGBA1C" in the last 72 hours. Lipid Profile No results for input(s): "CHOL", "HDL", "LDLCALC", "TRIG", "CHOLHDL", "LDLDIRECT" in the last 72 hours. Thyroid function studies No results for input(s): "TSH", "T4TOTAL", "T3FREE", "THYROIDAB" in the last 72 hours.  Invalid input(s): "FREET3" Anemia work up No results for input(s): "VITAMINB12", "FOLATE", "FERRITIN", "TIBC", "IRON", "RETICCTPCT" in the last 72 hours. Urinalysis No results found for: "COLORURINE", "APPEARANCEUR", "LABSPEC", "PHURINE", "GLUCOSEU", "HGBUR", "BILIRUBINUR", "KETONESUR", "PROTEINUR", "UROBILINOGEN", "NITRITE", "LEUKOCYTESUR" Sepsis Labs Recent Labs  Lab 01/09/22 1656 01/10/22 0312 01/11/22 0507  WBC 11.8* 8.0 7.0   Microbiology No results found for this or any previous visit (from the past 240 hour(s)). Imaging PERIPHERAL VASCULAR CATHETERIZATION  Result Date: 01/10/2022 See surgical note for result.  US Venous Img Lower Right (DVT Study)  Result Date: 01/09/2022 CLINICAL DATA:  Right leg pain and swelling since Monday. EXAM: RIGHT LOWER EXTREMITY VENOUS DOPPLER ULTRASOUND TECHNIQUE: Gray-scale sonography with compression, as well as color and duplex ultrasound, were performed to evaluate the deep venous system(s) from the level of the common femoral vein through the popliteal and proximal calf veins. COMPARISON:  None Available. FINDINGS: VENOUS Acute occlusive thrombus involving the right common femoral vein, right saphenofemoral junction, right profunda femoral vein, right femoral vein, right popliteal vein, and right posterior tibial vein. The peroneal vein is not well visualized. Limited views of the contralateral common femoral vein are unremarkable. OTHER None. Limitations: None. IMPRESSION: 1. Extensive acute occlusive DVT throughout the right leg extending from the common femoral vein into the posterior tibial vein. Electronically Signed   By: Titus Dubin M.D.   On: 01/09/2022 13:31      Time  coordinating discharge: Over 30 minutes  SIGNED:  Emeterio Reeve DO [facility]

## 2022-01-11 NOTE — Consult Note (Signed)
ANTICOAGULATION CONSULT NOTE - Follow Up Consult  Pharmacy Consult for heparin gtt Indication:  VTE Treatment  Allergies  Allergen Reactions   Codeine Nausea And Vomiting    Patient Measurements: Height: 5' 2.25" (158.1 cm) Weight: 64.9 kg (143 lb 1.3 oz) IBW/kg (Calculated) : 50.68 Heparin Dosing Weight: 63.8kg  Vital Signs: Temp: 99.7 F (37.6 C) (08/26 0431) Temp Source: Oral (08/26 0431) BP: 97/56 (08/26 0431) Pulse Rate: 74 (08/26 0431)  Labs: Recent Labs    01/09/22 1656 01/10/22 0312 01/10/22 2240 01/11/22 0507  HGB 12.6 11.2*  --  9.7*  HCT 39.8 35.2*  --  30.8*  PLT 235 183  --  170  APTT 29  --   --   --   LABPROT 13.6 15.2  --   --   INR 1.1 1.2  --   --   HEPARINUNFRC  --  0.75* 0.37 0.34  CREATININE 0.70 0.70  --  0.71     Estimated Creatinine Clearance: 55.8 mL/min (by C-G formula based on SCr of 0.71 mg/dL).   Medications: Heparin Dosing Weight: 63.8kg PTA: no AC or APT PTA Inpatient: +heparin gtt  Assessment: 71yo F w/ h/o Crohn's disease, GERD, MDD, HLD, hypothyroidism, MS, macular degeneration who presents for RLE swelling and pain diagnosed with acute DVT of RLE. No AC or APT noted prior to arrival. Planning thrombectomy tomorrow AM on 8/25. Pharmacy consulted for heparin gtt mgmt.  Date Time aPTT/HL Rate/Comment 8/25 0312 HL 0.75 Supratherapeutic 8/25 2240 HL 0.37 Therapeutic x 1 8/26 0507 HL 0.34 Therapeutic x 2  Baseline Labs: aPTT - pending INR - pending Hgb - 12.6 Plts - 235  Goal of Therapy:  Heparin level 0.3-0.7 units/ml Monitor platelets by anticoagulation protocol: Yes   Plan:  Continue heparin infusion at 1000 units/hr Recheck HL daily w/ AM labs while therapeutic CBC daily while on heparin  Renda Rolls, PharmD, [facility] 01/11/2022 5:54 AM

## 2022-01-11 NOTE — Progress Notes (Signed)
Pt has DC order. Heparin has been stopped. MD wants to give the dose of Eliquis before DC, med was given, 2nd dose to be given tonight at home. AVS was given and explained to pt and husband, all questions has been answered.

## 2022-01-13 ENCOUNTER — Encounter: Payer: Self-pay | Admitting: Vascular Surgery

## 2022-01-13 ENCOUNTER — Telehealth: Payer: Self-pay | Admitting: *Deleted

## 2022-01-13 NOTE — Patient Outreach (Signed)
  Care Coordination TOC Note Transition Care Management Follow-up Telephone Call Date of discharge and from where: [facility] [phone] How have you been since you were released from the hospital?a little nauseated this morning Any questions or concerns? No  Items Reviewed: Did the pt receive and understand the discharge instructions provided? Yes  Medications obtained and verified? Yes  Other? No  Any new allergies since your discharge? No  Dietary orders reviewed? Yes Do you have support at home? Yes   Home Care and Equipment/Supplies: Were home health services ordered? no If so, what is the name of the agency? N   Has the agency set up a time to come to the patient's home? not applicable Were any new equipment or medical supplies ordered?  No What is the name of the medical supply agency? N  Were you able to get the supplies/equipment? not applicable Do you have any questions related to the use of the equipment or supplies? No  Functional Questionnaire: (I = Independent and D = Dependent) ADLs: D  Bathing/Dressing- D  Meal Prep- D  Eating- i  Maintaining continence- I  Transferring/Ambulation- I  Managing Meds- I  Follow up appointments reviewed:  PCP Hospital f/u appt confirmed? No   Specialist Hospital f/u appt confirmed? Yes  Scheduled to see Dr Lucky Cowboy [phone] Are transportation arrangements needed? N If their condition worsens, is the pt aware to call PCP or go to the Emergency Dept.? Yes Was the patient provided with contact information for the PCP's office or ED? Yes Was to pt encouraged to call back with questions or concerns? Yes  SDOH assessments and interventions completed:   Yes  Care Coordination Interventions Activated:  No   Care Coordination Interventions:   N     Encounter Outcome:  Pt. Visit Completed    [facility] [phone]

## 2022-01-14 ENCOUNTER — Encounter (INDEPENDENT_AMBULATORY_CARE_PROVIDER_SITE_OTHER): Payer: Self-pay | Admitting: Vascular Surgery

## 2022-01-14 ENCOUNTER — Ambulatory Visit (INDEPENDENT_AMBULATORY_CARE_PROVIDER_SITE_OTHER): Payer: Medicare HMO | Admitting: Vascular Surgery

## 2022-01-14 VITALS — BP 132/80 | HR 75 | Resp 17 | Ht 62.0 in

## 2022-01-14 DIAGNOSIS — G35 Multiple sclerosis: Secondary | ICD-10-CM | POA: Diagnosis not present

## 2022-01-14 DIAGNOSIS — I82421 Acute embolism and thrombosis of right iliac vein: Secondary | ICD-10-CM

## 2022-01-14 NOTE — Assessment & Plan Note (Signed)
The patient is status post thrombectomy of an extensive right lower extremity DVT that extended into the iliac veins.  This is resulted in marked improvement in her symptoms.  At this point, she needs to maintain anticoagulation.  This can repeat an ultrasound in about 4 to 6 weeks in follow-up.  She can elevate her legs and use compression stockings as needed for the swelling.  I have given her a months worth of Eliquis samples today and a 30-day Eliquis card in hopes that we can get her through this with Eliquis and not have to switch her to Coumadin which will be much harder to manage and regulate.

## 2022-01-14 NOTE — Progress Notes (Signed)
MRN : [id number]  Brooke Juarez is a 71 y.o. (13-Oct-1950) female who presents with chief complaint of No chief complaint on file. [name]  History of Present Illness: Patient returns today in follow up of her DVT.  4 days ago, she underwent right lower extremity venous thrombectomy for extensive right lower extremity DVT.  Her leg is far less swollen and painful after thrombectomy.  The Eliquis is very expensive and they are not sure they will be able to continue taking it.  So far, she has had no bleeding issues or problems.  Current Outpatient Medications  Medication Sig Dispense Refill   alendronate (FOSAMAX) 70 MG tablet TAKE 1 TABLET BY MOUTH ONCE A WEEK ON AN EMPTY STOMACH WITH  A  FULL  GLASS  OF  WATER 12 tablet 1   apixaban (ELIQUIS) 5 MG TABS tablet Take 2 tablets (10 mg total) by mouth 2 (two) times daily for 7 days. 28 tablet 0   [START ON 01/18/2022] apixaban (ELIQUIS) 5 MG TABS tablet Take 1 tablet (5 mg total) by mouth 2 (two) times daily. 60 tablet 0   cholecalciferol (VITAMIN D3) 10 MCG (400 UNIT) TABS tablet Take 400 Units by mouth.     dronabinol (MARINOL) 2.5 MG capsule Take 2.5 mg by mouth daily as needed.     levothyroxine (EUTHYROX) 112 MCG tablet Take 1 tablet (112 mcg total) by mouth daily. 90 tablet 1   methocarbamol (ROBAXIN) 500 MG tablet SMARTSIG:0.5 Tablet(s) By Mouth Every 12 Hours PRN     montelukast (SINGULAIR) 10 MG tablet Take 1 tablet (10 mg total) by mouth at bedtime. 90 tablet 1   Multiple Vitamins-Minerals (PRESERVISION AREDS 2 PO) Take 2 capsules by mouth daily.     Omega-3 Fatty Acids (FISH OIL) 1000 MG CAPS Take by mouth.     oxyCODONE-acetaminophen (PERCOCET/ROXICET) 5-325 MG tablet SMARTSIG:0.5 Tablet(s) By Mouth Every 12 Hours PRN     pantoprazole (PROTONIX) 40 MG tablet Take 1 tablet (40 mg total) by mouth daily. 90 tablet 1   potassium chloride (KLOR-CON) 10 MEQ tablet TAKE 1 TABLET BY MOUTH 3 TIMES PER WEEK 36 tablet 1   rOPINIRole (REQUIP) 1 MG  tablet Take 2 tablets by mouth at bedtime.     sulfaSALAzine (AZULFIDINE) 500 MG EC tablet Take 500 mg by mouth 2 (two) times daily.     traZODone (DESYREL) 100 MG tablet Take 1 tablet (100 mg total) by mouth daily. 90 tablet 1   vitamin B-12 (CYANOCOBALAMIN) 500 MCG tablet Take 500 mcg by mouth every 14 (fourteen) days.      gabapentin (NEURONTIN) 300 MG capsule Take 1 capsule by mouth 4 (four) times daily. [name]     No current facility-administered medications for this visit.    Past Medical History:  Diagnosis Date   Arthritis    Crohn disease ([place])    Depression    GERD (gastroesophageal reflux disease)    Headache    Migraines   Heart murmur    Hyperlipidemia    Hypothyroidism    Macular degeneration    MS (multiple sclerosis) ([place])    Restless leg    Seizures ([place])    Tremors of nervous system     Past Surgical History:  Procedure Laterality Date   APPENDECTOMY Left 04/14/2018   Procedure: APPENDECTOMY;  Surgeon: Olean Ree, MD;  Location: [facility];  Service: General;  Laterality: Left;   CATARACT EXTRACTION W/PHACO Right 10/30/2014   Procedure: CATARACT EXTRACTION PHACO  AND INTRAOCULAR LENS PLACEMENT (IOC);  Surgeon: Estill Cotta, MD;  Location: [facility];  Service: Ophthalmology;  Laterality: Right;  [place]  1:55.7    CATARACT EXTRACTION W/PHACO Left 06/18/2015   Procedure: CATARACT EXTRACTION PHACO AND INTRAOCULAR LENS PLACEMENT (IOC);  Surgeon: Estill Cotta, MD;  Location: [facility];  Service: Ophthalmology;  Laterality: Left;  [place] 01:36 AP% 26.1 CDE 42.19 fluid pack lo0t # [id number]   COLONOSCOPY  2014   normal- Rein- repeat in 5 years   COLONOSCOPY WITH PROPOFOL N/A 07/09/2020   Procedure: COLONOSCOPY WITH PROPOFOL;  Surgeon: Toledo, Benay Pike, MD;  Location: [facility];  Service: Gastroenterology;  Laterality: N/A;   EYE SURGERY     FOOT SURGERY     INGUINAL HERNIA REPAIR Right 04/14/2018   Procedure: HERNIA REPAIR INGUINAL;  Surgeon: Olean Ree, MD;   Location: [facility];  Service: General;  Laterality: Right;   NASAL SINUS SURGERY     PERIPHERAL VASCULAR THROMBECTOMY Right 01/10/2022   Procedure: PERIPHERAL VASCULAR THROMBECTOMY;  Surgeon: Algernon Huxley, MD;  Location: [facility];  Service: Cardiovascular;  Laterality: Right;   TONSILLECTOMY     TUBAL LIGATION     VAGINAL HYSTERECTOMY       Social History   Tobacco Use   Smoking status: Every Day    Packs/day: 0.75    Years: 55.00    Total pack years: 41.25    Types: Cigarettes   Smokeless tobacco: Never  Vaping Use   Vaping Use: Never used  Substance Use Topics   Alcohol use: No    Alcohol/week: 0.0 standard drinks of alcohol   Drug use: No       Family History  Problem Relation Age of Onset   Heart disease Mother    Stroke Mother    Cancer Mother        colon   Mitral valve prolapse Mother    Stroke Father    Breast cancer Neg Hx      Allergies  Allergen Reactions   Codeine Nausea And Vomiting     REVIEW OF SYSTEMS (Negative unless checked)  Constitutional: [] Weight loss  [] Fever  [] Chills Cardiac: [] Chest pain   [] Chest pressure   [] Palpitations   [] Shortness of breath when laying flat   [] Shortness of breath at rest   [] Shortness of breath with exertion. Vascular:  [] Pain in legs with walking   [] Pain in legs at rest   [] Pain in legs when laying flat   [] Claudication   [] Pain in feet when walking  [] Pain in feet at rest  [] Pain in feet when laying flat   [x] History of DVT   [x] Phlebitis   [x] Swelling in legs   [] Varicose veins   [] Non-healing ulcers Pulmonary:   [] Uses home oxygen   [] Productive cough   [] Hemoptysis   [] Wheeze  [] COPD   [] Asthma Neurologic:  [] Dizziness  [] Blackouts   [x] Seizures   [] History of stroke   [] History of TIA  [] Aphasia   [] Temporary blindness   [] Dysphagia   [] Weakness or numbness in arms   [] Weakness or numbness in legs Musculoskeletal:  [x] Arthritis   [] Joint swelling   [] Joint pain   [] Low back pain Hematologic:   [] Easy bruising  [] Easy bleeding   [] Hypercoagulable state   [] Anemic   Gastrointestinal:  [] Blood in stool   [] Vomiting blood  [] Gastroesophageal reflux/heartburn   [] Abdominal pain Genitourinary:  [] Chronic kidney disease   [] Difficult urination  [] Frequent urination  [] Burning with urination   [] Hematuria Skin:  []   Rashes   [] Ulcers   [] Wounds Psychological:  [] History of anxiety   [x]  History of major depression.  Physical Examination  BP 132/80 (BP Location: Left Arm)   Pulse 75   Resp 17   Ht 5' 2"  (1.575 m)   LMP  (LMP Unknown) Comment: had hysterectomy  BMI 26.17 kg/m  Gen:  WD/WN, NAD Head: [place]/AT, No temporalis wasting. Ear/Nose/Throat: Hearing grossly intact, nares w/o erythema or drainage Eyes: Conjunctiva clear. Sclera non-icteric Neck: Supple.  Trachea midline Pulmonary:  Good air movement, no use of accessory muscles.  Cardiac: RRR, no JVD Vascular:  Vessel Right Left  Radial Palpable Palpable               Musculoskeletal: M/S 5/5 throughout.  No deformity or atrophy.  1+ right lower extremity edema. Neurologic: Sensation grossly intact in extremities.  Symmetrical.  Speech is fluent.  Psychiatric: Judgment intact, Mood & affect appropriate for pt's clinical situation. Dermatologic: No rashes or ulcers noted.  No cellulitis or open wounds.  Access site is well-healed      Labs Recent Results (from the past 2160 hour(s))  TSH     Status: None   Collection Time: 12/09/21 11:11 AM  Result Value Ref Range   TSH 0.473 0.450 - 4.500 uIU/mL  CBC with Differential     Status: Abnormal   Collection Time: 01/09/22  4:56 PM  Result Value Ref Range   WBC 11.8 (H) 4.0 - 10.5 K/uL   RBC 4.41 3.87 - 5.11 MIL/uL   Hemoglobin 12.6 12.0 - 15.0 g/dL   HCT 39.8 36.0 - 46.0 %   MCV 90.2 80.0 - 100.0 fL   MCH 28.6 26.0 - 34.0 pg   MCHC 31.7 30.0 - 36.0 g/dL   RDW 13.8 11.5 - 15.5 %   Platelets 235 150 - 400 K/uL   nRBC 0.0 0.0 - 0.2 %   Neutrophils Relative % 80 %    Neutro Abs 9.4 (H) 1.7 - 7.7 K/uL   Lymphocytes Relative 13 %   Lymphs Abs 1.5 0.7 - 4.0 K/uL   Monocytes Relative 6 %   Monocytes Absolute 0.7 0.1 - 1.0 K/uL   Eosinophils Relative 1 %   Eosinophils Absolute 0.1 0.0 - 0.5 K/uL   Basophils Relative 0 %   Basophils Absolute 0.0 0.0 - 0.1 K/uL   Immature Granulocytes 0 %   Abs Immature Granulocytes 0.05 0.00 - 0.07 K/uL    Comment: Performed at [facility], [address] [place], [place] [address]  Comprehensive metabolic panel     Status: Abnormal   Collection Time: 01/09/22  4:56 PM  Result Value Ref Range   Sodium 139 135 - 145 mmol/L   Potassium 3.6 3.5 - 5.1 mmol/L   Chloride 103 98 - 111 mmol/L   CO2 24 22 - 32 mmol/L   Glucose, Bld 118 (H) 70 - 99 mg/dL    Comment: Glucose reference range applies only to samples taken after fasting for at least 8 hours.   BUN 9 8 - 23 mg/dL   Creatinine, Ser 0.70 0.44 - 1.00 mg/dL   Calcium 9.1 8.9 - 10.3 mg/dL   Total Protein 6.6 6.5 - 8.1 g/dL   Albumin 3.9 3.5 - 5.0 g/dL   AST 18 15 - 41 U/L   ALT 13 0 - 44 U/L   Alkaline Phosphatase 126 38 - 126 U/L   Total Bilirubin 1.2 0.3 - 1.2 mg/dL   GFR, Estimated >60 >  60 mL/min    Comment: (NOTE) Calculated using the CKD-EPI Creatinine Equation (2021)    Anion gap 12 5 - 15    Comment: Performed at [facility], [address] [place], [place] [address]  Lipase, blood     Status: None   Collection Time: 01/09/22  4:56 PM  Result Value Ref Range   Lipase 34 11 - 51 U/L    Comment: Performed at [facility], [address] [place], [place] [address]  APTT     Status: None   Collection Time: 01/09/22  4:56 PM  Result Value Ref Range   aPTT 29 24 - 36 seconds    Comment: Performed at [facility], [address] [place], [place] [address]  Protime-INR     Status: None   Collection Time: 01/09/22  4:56 PM  Result Value Ref Range   Prothrombin Time 13.6 11.4 - 15.2 seconds   INR 1.1 0.8 - 1.2     Comment: (NOTE) INR goal varies based on device and disease states. Performed at [facility], [place], [place] [address]   Heparin level (unfractionated)     Status: Abnormal   Collection Time: 01/10/22  3:12 AM  Result Value Ref Range   Heparin Unfractionated 0.75 (H) 0.30 - 0.70 IU/mL    Comment: (NOTE) The clinical reportable range upper limit is being lowered to >1.10 to align with the FDA approved guidance for the current laboratory assay.  If heparin results are below expected values, and patient dosage has  been confirmed, suggest follow up testing of antithrombin III levels. Performed at [facility], [address] [place], [place] [address]   CBC     Status: Abnormal   Collection Time: 01/10/22  3:12 AM  Result Value Ref Range   WBC 8.0 4.0 - 10.5 K/uL   RBC 3.94 3.87 - 5.11 MIL/uL   Hemoglobin 11.2 (L) 12.0 - 15.0 g/dL   HCT 35.2 (L) 36.0 - 46.0 %   MCV 89.3 80.0 - 100.0 fL   MCH 28.4 26.0 - 34.0 pg   MCHC 31.8 30.0 - 36.0 g/dL   RDW 13.6 11.5 - 15.5 %   Platelets 183 150 - 400 K/uL   nRBC 0.0 0.0 - 0.2 %    Comment: Performed at [facility], [address] [place], [place] [address]  Basic metabolic panel     Status: Abnormal   Collection Time: 01/10/22  3:12 AM  Result Value Ref Range   Sodium 138 135 - 145 mmol/L   Potassium 3.5 3.5 - 5.1 mmol/L   Chloride 107 98 - 111 mmol/L   CO2 24 22 - 32 mmol/L   Glucose, Bld 112 (H) 70 - 99 mg/dL    Comment: Glucose reference range applies only to samples taken after fasting for at least 8 hours.   BUN 8 8 - 23 mg/dL   Creatinine, Ser 0.70 0.44 - 1.00 mg/dL   Calcium 8.1 (L) 8.9 - 10.3 mg/dL   GFR, Estimated >60 >60 mL/min    Comment: (NOTE) Calculated using the CKD-EPI Creatinine Equation (2021)    Anion gap 7 5 - 15    Comment: Performed at [facility], [address] [place],  [address]  Protime-INR     Status: None   Collection Time:  01/10/22  3:12 AM  Result Value Ref Range   Prothrombin Time 15.2 11.4 - 15.2 seconds   INR 1.2  0.8 - 1.2    Comment: (NOTE) INR goal varies based on device and disease states. Performed at [facility], [place], [place] [address]   Heparin level (unfractionated)     Status: None   Collection Time: 01/10/22 10:40 PM  Result Value Ref Range   Heparin Unfractionated 0.37 0.30 - 0.70 IU/mL    Comment: (NOTE) The clinical reportable range upper limit is being lowered to >1.10 to align with the FDA approved guidance for the current laboratory assay.  If heparin results are below expected values, and patient dosage has  been confirmed, suggest follow up testing of antithrombin III levels. Performed at [facility], [address] [place], [place] [address]   CBC     Status: Abnormal   Collection Time: 01/11/22  5:07 AM  Result Value Ref Range   WBC 7.0 4.0 - 10.5 K/uL   RBC 3.39 (L) 3.87 - 5.11 MIL/uL   Hemoglobin 9.7 (L) 12.0 - 15.0 g/dL   HCT 30.8 (L) 36.0 - 46.0 %   MCV 90.9 80.0 - 100.0 fL   MCH 28.6 26.0 - 34.0 pg   MCHC 31.5 30.0 - 36.0 g/dL   RDW 13.4 11.5 - 15.5 %   Platelets 170 150 - 400 K/uL   nRBC 0.0 0.0 - 0.2 %    Comment: Performed at [facility], [address], [place], [place] [address]  Basic metabolic panel     Status: Abnormal   Collection Time: 01/11/22  5:07 AM  Result Value Ref Range   Sodium 140 135 - 145 mmol/L   Potassium 3.2 (L) 3.5 - 5.1 mmol/L   Chloride 112 (H) 98 - 111 mmol/L   CO2 21 (L) 22 - 32 mmol/L   Glucose, Bld 82 70 - 99 mg/dL    Comment: Glucose reference range applies only to samples taken after fasting for at least 8 hours.   BUN 8 8 - 23 mg/dL   Creatinine, Ser 0.71 0.44 - 1.00 mg/dL   Calcium 7.8 (L) 8.9 - 10.3 mg/dL   GFR, Estimated >60 >60 mL/min    Comment: (NOTE) Calculated using the CKD-EPI Creatinine Equation (2021)    Anion gap 7 5 - 15    Comment: Performed at [facility], [place], [place] [address]  Heparin level (unfractionated)     Status: None   Collection Time: 01/11/22  5:07 AM  Result Value Ref Range   Heparin Unfractionated 0.34 0.30 - 0.70 IU/mL    Comment: (NOTE) The clinical reportable range upper limit is being lowered to >1.10 to align with the FDA approved guidance for the current laboratory assay.  If heparin results are below expected values, and patient dosage has  been confirmed, suggest follow up testing of antithrombin III levels. Performed at [facility], [address], [place],  [address]     Radiology PERIPHERAL VASCULAR CATHETERIZATION  Result Date: 01/10/2022 See surgical note for result.  US Venous Img Lower Right (DVT Study)  Result Date: 01/09/2022 CLINICAL DATA:  Right leg pain and swelling since Monday. EXAM: RIGHT LOWER EXTREMITY VENOUS DOPPLER ULTRASOUND TECHNIQUE: Gray-scale sonography with compression, as well as color and duplex ultrasound, were performed to evaluate the deep venous system(s) from the level of the common femoral vein through the popliteal and proximal calf veins. COMPARISON:  None Available. FINDINGS: VENOUS Acute occlusive thrombus involving the right common femoral vein, right saphenofemoral junction, right profunda femoral vein,  right femoral vein, right popliteal vein, and right posterior tibial vein. The peroneal vein is not well visualized. Limited views of the contralateral common femoral vein are unremarkable. OTHER None. Limitations: None. IMPRESSION: 1. Extensive acute occlusive DVT throughout the right leg extending from the common femoral vein into the posterior tibial vein. Electronically Signed   By: Titus Dubin M.D.   On: 01/09/2022 13:31    Assessment/Plan  DVT (deep venous thrombosis) ([place]) The patient is status post thrombectomy of an extensive right lower extremity DVT that extended into the iliac veins.  This is resulted in marked  improvement in her symptoms.  At this point, she needs to maintain anticoagulation.  This can repeat an ultrasound in about 4 to 6 weeks in follow-up.  She can elevate her legs and use compression stockings as needed for the swelling.  I have given her a months worth of Eliquis samples today and a 30-day Eliquis card in hopes that we can get her through this with Eliquis and not have to switch her to Coumadin which will be much harder to manage and regulate.  MS (multiple sclerosis) ([facility]) Limits her mobility.    Leotis Pain, MD  01/14/2022 1:56 PM    This note was created with Dragon medical transcription system.  Any errors from dictation are purely unintentional

## 2022-01-14 NOTE — Assessment & Plan Note (Signed)
Limits her mobility.

## 2022-01-22 DIAGNOSIS — D649 Anemia, unspecified: Secondary | ICD-10-CM | POA: Diagnosis not present

## 2022-01-22 DIAGNOSIS — K219 Gastro-esophageal reflux disease without esophagitis: Secondary | ICD-10-CM | POA: Diagnosis not present

## 2022-01-22 DIAGNOSIS — K52831 Collagenous colitis: Secondary | ICD-10-CM | POA: Diagnosis not present

## 2022-01-29 DIAGNOSIS — R404 Transient alteration of awareness: Secondary | ICD-10-CM | POA: Diagnosis not present

## 2022-01-29 DIAGNOSIS — G2581 Restless legs syndrome: Secondary | ICD-10-CM | POA: Diagnosis not present

## 2022-01-29 DIAGNOSIS — G8929 Other chronic pain: Secondary | ICD-10-CM | POA: Diagnosis not present

## 2022-01-29 DIAGNOSIS — F458 Other somatoform disorders: Secondary | ICD-10-CM | POA: Diagnosis not present

## 2022-01-29 DIAGNOSIS — M542 Cervicalgia: Secondary | ICD-10-CM | POA: Diagnosis not present

## 2022-01-29 DIAGNOSIS — R2689 Other abnormalities of gait and mobility: Secondary | ICD-10-CM | POA: Diagnosis not present

## 2022-01-29 DIAGNOSIS — Z86718 Personal history of other venous thrombosis and embolism: Secondary | ICD-10-CM | POA: Diagnosis not present

## 2022-01-31 DIAGNOSIS — H353221 Exudative age-related macular degeneration, left eye, with active choroidal neovascularization: Secondary | ICD-10-CM | POA: Diagnosis not present

## 2022-01-31 DIAGNOSIS — H353211 Exudative age-related macular degeneration, right eye, with active choroidal neovascularization: Secondary | ICD-10-CM | POA: Diagnosis not present

## 2022-02-10 DIAGNOSIS — M1990 Unspecified osteoarthritis, unspecified site: Secondary | ICD-10-CM | POA: Diagnosis not present

## 2022-02-10 DIAGNOSIS — G4089 Other seizures: Secondary | ICD-10-CM | POA: Diagnosis not present

## 2022-02-10 DIAGNOSIS — G35 Multiple sclerosis: Secondary | ICD-10-CM | POA: Diagnosis not present

## 2022-02-10 DIAGNOSIS — G894 Chronic pain syndrome: Secondary | ICD-10-CM | POA: Diagnosis not present

## 2022-02-10 DIAGNOSIS — Z79899 Other long term (current) drug therapy: Secondary | ICD-10-CM | POA: Diagnosis not present

## 2022-02-10 DIAGNOSIS — M542 Cervicalgia: Secondary | ICD-10-CM | POA: Diagnosis not present

## 2022-02-10 DIAGNOSIS — G6289 Other specified polyneuropathies: Secondary | ICD-10-CM | POA: Diagnosis not present

## 2022-02-10 DIAGNOSIS — G2581 Restless legs syndrome: Secondary | ICD-10-CM | POA: Diagnosis not present

## 2022-02-10 DIAGNOSIS — K219 Gastro-esophageal reflux disease without esophagitis: Secondary | ICD-10-CM | POA: Diagnosis not present

## 2022-02-10 DIAGNOSIS — G8929 Other chronic pain: Secondary | ICD-10-CM | POA: Diagnosis not present

## 2022-02-10 DIAGNOSIS — M5440 Lumbago with sciatica, unspecified side: Secondary | ICD-10-CM | POA: Diagnosis not present

## 2022-02-21 ENCOUNTER — Ambulatory Visit (INDEPENDENT_AMBULATORY_CARE_PROVIDER_SITE_OTHER): Payer: Medicare HMO

## 2022-02-21 ENCOUNTER — Ambulatory Visit (INDEPENDENT_AMBULATORY_CARE_PROVIDER_SITE_OTHER): Payer: Medicare HMO | Admitting: Nurse Practitioner

## 2022-02-21 ENCOUNTER — Encounter (INDEPENDENT_AMBULATORY_CARE_PROVIDER_SITE_OTHER): Payer: Self-pay | Admitting: Vascular Surgery

## 2022-02-21 ENCOUNTER — Ambulatory Visit (INDEPENDENT_AMBULATORY_CARE_PROVIDER_SITE_OTHER): Payer: Medicare HMO | Admitting: Vascular Surgery

## 2022-02-21 VITALS — BP 120/66 | HR 65 | Resp 18 | Ht 62.5 in | Wt 145.0 lb

## 2022-02-21 DIAGNOSIS — G35 Multiple sclerosis: Secondary | ICD-10-CM

## 2022-02-21 DIAGNOSIS — E782 Mixed hyperlipidemia: Secondary | ICD-10-CM | POA: Diagnosis not present

## 2022-02-21 DIAGNOSIS — I82421 Acute embolism and thrombosis of right iliac vein: Secondary | ICD-10-CM

## 2022-02-21 NOTE — Assessment & Plan Note (Signed)
Duplex today shows a marked improvement with only some residual DVT in one of the paired superficial femoral veins.  Marked improvement after venous thrombectomy.  Continue anticoagulation for a minimum of 6 months.  We gave her some samples for Eliquis today due to the high cost she is having difficulty with this.  At 6 months, we can discuss future therapy but it sounds like long-term Eliquis may be cost prohibitive.

## 2022-02-21 NOTE — Progress Notes (Signed)
MRN : [id number]  Brooke Juarez is a 71 y.o. (1950/10/26) female who presents with chief complaint of No chief complaint on file. [name]  History of Present Illness: Patient returns today in follow up of her right lower extremity DVT.  About 6 weeks ago, she underwent extensive venous thrombectomy for her extensive right lower extremity DVT.  She is done very well since then.  Her right leg has returned to roughly normal size and function.  She does have MS so she is limited in her activity and mobility at a baseline.  Tolerating anticoagulation without any major issues.  Taking Eliquis 5 mg twice daily.  Duplex today shows a marked improvement with only some residual DVT in one of the paired superficial femoral veins.  Current Outpatient Medications  Medication Sig Dispense Refill   alendronate (FOSAMAX) 70 MG tablet TAKE 1 TABLET BY MOUTH ONCE A WEEK ON AN EMPTY STOMACH WITH  A  FULL  GLASS  OF  WATER 12 tablet 1   apixaban (ELIQUIS) 5 MG TABS tablet Take 1 tablet (5 mg total) by mouth 2 (two) times daily. 60 tablet 0   cholecalciferol (VITAMIN D3) 10 MCG (400 UNIT) TABS tablet Take 400 Units by mouth.     dronabinol (MARINOL) 2.5 MG capsule Take 2.5 mg by mouth daily as needed.     levothyroxine (EUTHYROX) 112 MCG tablet Take 1 tablet (112 mcg total) by mouth daily. 90 tablet 1   methocarbamol (ROBAXIN) 500 MG tablet SMARTSIG:0.5 Tablet(s) By Mouth Every 12 Hours PRN     montelukast (SINGULAIR) 10 MG tablet Take 1 tablet (10 mg total) by mouth at bedtime. 90 tablet 1   Multiple Vitamins-Minerals (PRESERVISION AREDS 2 PO) Take 2 capsules by mouth daily.     Omega-3 Fatty Acids (FISH OIL) 1000 MG CAPS Take by mouth.     oxyCODONE-acetaminophen (PERCOCET/ROXICET) 5-325 MG tablet SMARTSIG:0.5 Tablet(s) By Mouth Every 12 Hours PRN     pantoprazole (PROTONIX) 40 MG tablet Take 1 tablet (40 mg total) by mouth daily. 90 tablet 1   potassium chloride (KLOR-CON) 10 MEQ tablet TAKE 1 TABLET BY MOUTH 3  TIMES PER WEEK 36 tablet 1   rOPINIRole (REQUIP) 1 MG tablet Take 2 tablets by mouth at bedtime.     sulfaSALAzine (AZULFIDINE) 500 MG EC tablet Take 500 mg by mouth 2 (two) times daily.     traZODone (DESYREL) 100 MG tablet Take 1 tablet (100 mg total) by mouth daily. 90 tablet 1   vitamin B-12 (CYANOCOBALAMIN) 500 MCG tablet Take 500 mcg by mouth every 14 (fourteen) days.      Alpha-Lipoic Acid 200 MG TABS Take by mouth.     apixaban (ELIQUIS) 5 MG TABS tablet Take 2 tablets (10 mg total) by mouth 2 (two) times daily for 7 days. 28 tablet 0   gabapentin (NEURONTIN) 300 MG capsule Take 1 capsule by mouth 4 (four) times daily. Brooke Juarez     No current facility-administered medications for this visit.    Past Medical History:  Diagnosis Date   Arthritis    Crohn disease ([place])    Depression    GERD (gastroesophageal reflux disease)    Headache    Migraines   Heart murmur    Hyperlipidemia    Hypothyroidism    Macular degeneration    MS (multiple sclerosis) ([place])    Restless leg    Seizures (HCC)    Tremors of nervous system     Past Surgical  History:  Procedure Laterality Date   APPENDECTOMY Left 04/14/2018   Procedure: APPENDECTOMY;  Surgeon: Olean Ree, MD;  Location: [facility];  Service: General;  Laterality: Left;   CATARACT EXTRACTION W/PHACO Right 10/30/2014   Procedure: CATARACT EXTRACTION PHACO AND INTRAOCULAR LENS PLACEMENT ([place]);  Surgeon: Estill Cotta, MD;  Location: [facility];  Service: Ophthalmology;  Laterality: Right;  [place]  1:55.7    CATARACT EXTRACTION W/PHACO Left 06/18/2015   Procedure: CATARACT EXTRACTION PHACO AND INTRAOCULAR LENS PLACEMENT ([facility]);  Surgeon: Estill Cotta, MD;  Location: [facility];  Service: Ophthalmology;  Laterality: Left;  [place] 01:36 AP% 26.1 CDE 42.19 fluid pack lo0t # [id number]   COLONOSCOPY  2014   normal- Rein- repeat in 5 years   COLONOSCOPY WITH PROPOFOL N/A 07/09/2020   Procedure: COLONOSCOPY WITH PROPOFOL;  Surgeon: [place],  Benay Pike, MD;  Location: [facility];  Service: Gastroenterology;  Laterality: N/A;   EYE SURGERY     FOOT SURGERY     INGUINAL HERNIA REPAIR Right 04/14/2018   Procedure: HERNIA REPAIR INGUINAL;  Surgeon: Olean Ree, MD;  Location: [facility];  Service: General;  Laterality: Right;   NASAL SINUS SURGERY     PERIPHERAL VASCULAR THROMBECTOMY Right 01/10/2022   Procedure: PERIPHERAL VASCULAR THROMBECTOMY;  Surgeon: Algernon Huxley, MD;  Location: [facility];  Service: Cardiovascular;  Laterality: Right;   TONSILLECTOMY     TUBAL LIGATION     VAGINAL HYSTERECTOMY       Social History   Tobacco Use   Smoking status: Every Day    Packs/day: 0.75    Years: 55.00    Total pack years: 41.25    Types: Cigarettes   Smokeless tobacco: Never  Vaping Use   Vaping Use: Never used  Substance Use Topics   Alcohol use: No    Alcohol/week: 0.0 standard drinks of alcohol   Drug use: No      Family History  Problem Relation Age of Onset   Heart disease Mother    Stroke Mother    Cancer Mother        colon   Mitral valve prolapse Mother    Stroke Father    Breast cancer Neg Hx     Allergies  Allergen Reactions   Codeine Nausea And Vomiting     REVIEW OF SYSTEMS (Negative unless checked)  Constitutional: [] Weight loss  [] Fever  [] Chills Cardiac: [] Chest pain   [] Chest pressure   [] Palpitations   [] Shortness of breath when laying flat   [] Shortness of breath at rest   [] Shortness of breath with exertion. Vascular:  [] Pain in legs with walking   [] Pain in legs at rest   [] Pain in legs when laying flat   [] Claudication   [] Pain in feet when walking  [] Pain in feet at rest  [] Pain in feet when laying flat   [x] History of DVT   [x] Phlebitis   [x] Swelling in legs   [] Varicose veins   [] Non-healing ulcers Pulmonary:   [] Uses home oxygen   [] Productive cough   [] Hemoptysis   [] Wheeze  [] COPD   [] Asthma Neurologic:  [] Dizziness  [] Blackouts   [x] Seizures   [] History of stroke    [] History of TIA  [] Aphasia   [] Temporary blindness   [] Dysphagia   [x] Weakness or numbness in arms   [x] Weakness or numbness in legs Musculoskeletal:  [x] Arthritis   [] Joint swelling   [] Joint pain   [] Low back pain Hematologic:  [] Easy bruising  [] Easy bleeding   [] Hypercoagulable state   []   Anemic   Gastrointestinal:  [] Blood in stool   [] Vomiting blood  [x] Gastroesophageal reflux/heartburn   [] Abdominal pain Genitourinary:  [] Chronic kidney disease   [] Difficult urination  [] Frequent urination  [] Burning with urination   [] Hematuria Skin:  [] Rashes   [] Ulcers   [] Wounds Psychological:  [] History of anxiety   [x]  History of major depression.  Physical Examination  BP 120/66 (BP Location: Left Arm)   Pulse 65   Resp 18   Ht 5' 2.5" (1.588 m)   Wt 145 lb (65.8 kg)   LMP  (LMP Unknown) Comment: had hysterectomy  BMI 26.10 kg/m  Gen:  WD/WN, NAD Head: [place]/AT, No temporalis wasting. Ear/Nose/Throat: Hearing grossly intact, nares w/o erythema or drainage Eyes: Conjunctiva clear. Sclera non-icteric Neck: Supple.  Trachea midline Pulmonary:  Good air movement, no use of accessory muscles.  Cardiac: RRR, no JVD Vascular:  Vessel Right Left  Radial Palpable Palpable                          PT Palpable Palpable  DP Palpable Palpable   Musculoskeletal: M/S 5/5 throughout.  No deformity or atrophy.  In a wheelchair.  Trace bilateral lower extremity edema. Neurologic: Sensation grossly intact in extremities.  Symmetrical.  Speech is fluent.  Psychiatric: Judgment intact, Mood & affect appropriate for pt's clinical situation. Dermatologic: No rashes or ulcers noted.  No cellulitis or open wounds.      Labs Recent Results (from the past 2160 hour(s))  TSH     Status: None   Collection Time: 12/09/21 11:11 AM  Result Value Ref Range   TSH 0.473 0.450 - 4.500 uIU/mL  CBC with Differential     Status: Abnormal   Collection Time: 01/09/22  4:56 PM  Result Value Ref Range   WBC  11.8 (H) 4.0 - 10.5 K/uL   RBC 4.41 3.87 - 5.11 MIL/uL   Hemoglobin 12.6 12.0 - 15.0 g/dL   HCT 39.8 36.0 - 46.0 %   MCV 90.2 80.0 - 100.0 fL   MCH 28.6 26.0 - 34.0 pg   MCHC 31.7 30.0 - 36.0 g/dL   RDW 13.8 11.5 - 15.5 %   Platelets 235 150 - 400 K/uL   nRBC 0.0 0.0 - 0.2 %   Neutrophils Relative % 80 %   Neutro Abs 9.4 (H) 1.7 - 7.7 K/uL   Lymphocytes Relative 13 %   Lymphs Abs 1.5 0.7 - 4.0 K/uL   Monocytes Relative 6 %   Monocytes Absolute 0.7 0.1 - 1.0 K/uL   Eosinophils Relative 1 %   Eosinophils Absolute 0.1 0.0 - 0.5 K/uL   Basophils Relative 0 %   Basophils Absolute 0.0 0.0 - 0.1 K/uL   Immature Granulocytes 0 %   Abs Immature Granulocytes 0.05 0.00 - 0.07 K/uL    Comment: Performed at [facility], [address] [place],  [address]  Comprehensive metabolic panel     Status: Abnormal   Collection Time: 01/09/22  4:56 PM  Result Value Ref Range   Sodium 139 135 - 145 mmol/L   Potassium 3.6 3.5 - 5.1 mmol/L   Chloride 103 98 - 111 mmol/L   CO2 24 22 - 32 mmol/L   Glucose, Bld 118 (H) 70 - 99 mg/dL    Comment: Glucose reference range applies only to samples taken after fasting for at least 8 hours.   BUN 9 8 - 23 mg/dL   Creatinine, Ser 0.70 0.44 -  1.00 mg/dL   Calcium 9.1 8.9 - 10.3 mg/dL   Total Protein 6.6 6.5 - 8.1 g/dL   Albumin 3.9 3.5 - 5.0 g/dL   AST 18 15 - 41 U/L   ALT 13 0 - 44 U/L   Alkaline Phosphatase 126 38 - 126 U/L   Total Bilirubin 1.2 0.3 - 1.2 mg/dL   GFR, Estimated >60 >60 mL/min    Comment: (NOTE) Calculated using the CKD-EPI Creatinine Equation (2021)    Anion gap 12 5 - 15    Comment: Performed at [facility], [address] [place], [place] [address]  Lipase, blood     Status: None   Collection Time: 01/09/22  4:56 PM  Result Value Ref Range   Lipase 34 11 - 51 U/L    Comment: Performed at [facility], [address] [place], [place] [address]  APTT     Status: None   Collection Time:  01/09/22  4:56 PM  Result Value Ref Range   aPTT 29 24 - 36 seconds    Comment: Performed at [facility], [address] [place], [place] [address]  Protime-INR     Status: None   Collection Time: 01/09/22  4:56 PM  Result Value Ref Range   Prothrombin Time 13.6 11.4 - 15.2 seconds   INR 1.1 0.8 - 1.2    Comment: (NOTE) INR goal varies based on device and disease states. Performed at [facility], [place], [place] [address]   Heparin level (unfractionated)     Status: Abnormal   Collection Time: 01/10/22  3:12 AM  Result Value Ref Range   Heparin Unfractionated 0.75 (H) 0.30 - 0.70 IU/mL    Comment: (NOTE) The clinical reportable range upper limit is being lowered to >1.10 to align with the FDA approved guidance for the current laboratory assay.  If heparin results are below expected values, and patient dosage has  been confirmed, suggest follow up testing of antithrombin III levels. Performed at [facility], [address] [place], [place] [address]   CBC     Status: Abnormal   Collection Time: 01/10/22  3:12 AM  Result Value Ref Range   WBC 8.0 4.0 - 10.5 K/uL   RBC 3.94 3.87 - 5.11 MIL/uL   Hemoglobin 11.2 (L) 12.0 - 15.0 g/dL   HCT 35.2 (L) 36.0 - 46.0 %   MCV 89.3 80.0 - 100.0 fL   MCH 28.4 26.0 - 34.0 pg   MCHC 31.8 30.0 - 36.0 g/dL   RDW 13.6 11.5 - 15.5 %   Platelets 183 150 - 400 K/uL   nRBC 0.0 0.0 - 0.2 %    Comment: Performed at [facility], [address], [place], [place] [address]  Basic metabolic panel     Status: Abnormal   Collection Time: 01/10/22  3:12 AM  Result Value Ref Range   Sodium 138 135 - 145 mmol/L   Potassium 3.5 3.5 - 5.1 mmol/L   Chloride 107 98 - 111 mmol/L   CO2 24 22 - 32 mmol/L   Glucose, Bld 112 (H) 70 - 99 mg/dL    Comment: Glucose reference range applies only to samples taken after fasting for at least 8 hours.   BUN 8 8 - 23 mg/dL   Creatinine, Ser 0.70 0.44 - 1.00  mg/dL   Calcium 8.1 (L) 8.9 - 10.3 mg/dL   GFR, Estimated >60 >60 mL/min    Comment: (NOTE)  Calculated using the CKD-EPI Creatinine Equation (2021)    Anion gap 7 5 - 15    Comment: Performed at [facility], [address] [place], [place] [address]  Protime-INR     Status: None   Collection Time: 01/10/22  3:12 AM  Result Value Ref Range   Prothrombin Time 15.2 11.4 - 15.2 seconds   INR 1.2 0.8 - 1.2    Comment: (NOTE) INR goal varies based on device and disease states. Performed at [facility], [place], [place] [address]   Heparin level (unfractionated)     Status: None   Collection Time: 01/10/22 10:40 PM  Result Value Ref Range   Heparin Unfractionated 0.37 0.30 - 0.70 IU/mL    Comment: (NOTE) The clinical reportable range upper limit is being lowered to >1.10 to align with the FDA approved guidance for the current laboratory assay.  If heparin results are below expected values, and patient dosage has  been confirmed, suggest follow up testing of antithrombin III levels. Performed at [facility], [address] [place], [place] [address]   CBC     Status: Abnormal   Collection Time: 01/11/22  5:07 AM  Result Value Ref Range   WBC 7.0 4.0 - 10.5 K/uL   RBC 3.39 (L) 3.87 - 5.11 MIL/uL   Hemoglobin 9.7 (L) 12.0 - 15.0 g/dL   HCT 30.8 (L) 36.0 - 46.0 %   MCV 90.9 80.0 - 100.0 fL   MCH 28.6 26.0 - 34.0 pg   MCHC 31.5 30.0 - 36.0 g/dL   RDW 13.4 11.5 - 15.5 %   Platelets 170 150 - 400 K/uL   nRBC 0.0 0.0 - 0.2 %    Comment: Performed at [facility], [address], [place], [place] [address]  Basic metabolic panel     Status: Abnormal   Collection Time: 01/11/22  5:07 AM  Result Value Ref Range   Sodium 140 135 - 145 mmol/L   Potassium 3.2 (L) 3.5 - 5.1 mmol/L   Chloride 112 (H) 98 - 111 mmol/L   CO2 21 (L) 22 - 32 mmol/L   Glucose, Bld 82 70 - 99 mg/dL    Comment: Glucose reference range applies only to  samples taken after fasting for at least 8 hours.   BUN 8 8 - 23 mg/dL   Creatinine, Ser 0.71 0.44 - 1.00 mg/dL   Calcium 7.8 (L) 8.9 - 10.3 mg/dL   GFR, Estimated >60 >60 mL/min    Comment: (NOTE) Calculated using the CKD-EPI Creatinine Equation (2021)    Anion gap 7 5 - 15    Comment: Performed at [facility], [place], [place] [address]  Heparin level (unfractionated)     Status: None   Collection Time: 01/11/22  5:07 AM  Result Value Ref Range   Heparin Unfractionated 0.34 0.30 - 0.70 IU/mL    Comment: (NOTE) The clinical reportable range upper limit is being lowered to >1.10 to align with the FDA approved guidance for the current laboratory assay.  If heparin results are below expected values, and patient dosage has  been confirmed, suggest follow up testing of antithrombin III levels. Performed at [facility], [address], [place], [place] [address]     Radiology No results found.  Assessment/Plan  DVT (deep venous thrombosis) (HCC) Duplex today shows a marked improvement with only some residual DVT in one of the paired superficial femoral veins.  Marked  improvement after venous thrombectomy.  Continue anticoagulation for a minimum of 6 months.  We gave her some samples for Eliquis today due to the high cost she is having difficulty with this.  At 6 months, we can discuss future therapy but it sounds like long-term Eliquis may be cost prohibitive.  MS (multiple sclerosis) ([place]) Limits her mobility significantly which may worsen leg swelling.  Mixed hyperlipidemia lipid control important in reducing the progression of atherosclerotic disease    Leotis Pain, MD  02/21/2022 10:18 AM    This note was created with Dragon medical transcription system.  Any errors from dictation are purely unintentional

## 2022-02-21 NOTE — Assessment & Plan Note (Signed)
Limits her mobility significantly which may worsen leg swelling.

## 2022-02-21 NOTE — Assessment & Plan Note (Signed)
lipid control important in reducing the progression of atherosclerotic disease.   

## 2022-03-04 ENCOUNTER — Other Ambulatory Visit (HOSPITAL_BASED_OUTPATIENT_CLINIC_OR_DEPARTMENT_OTHER): Payer: Self-pay | Admitting: Osteopathic Medicine

## 2022-03-06 ENCOUNTER — Other Ambulatory Visit (INDEPENDENT_AMBULATORY_CARE_PROVIDER_SITE_OTHER): Payer: Self-pay

## 2022-03-06 MED ORDER — APIXABAN 5 MG PO TABS: 5.0000 mg | ORAL_TABLET | Freq: Two times a day (BID) | ORAL | 6 refills | Status: DC

## 2022-03-10 DIAGNOSIS — M542 Cervicalgia: Secondary | ICD-10-CM | POA: Diagnosis not present

## 2022-03-10 DIAGNOSIS — G2581 Restless legs syndrome: Secondary | ICD-10-CM | POA: Diagnosis not present

## 2022-03-10 DIAGNOSIS — G8929 Other chronic pain: Secondary | ICD-10-CM | POA: Diagnosis not present

## 2022-03-10 DIAGNOSIS — G894 Chronic pain syndrome: Secondary | ICD-10-CM | POA: Diagnosis not present

## 2022-03-10 DIAGNOSIS — Z79899 Other long term (current) drug therapy: Secondary | ICD-10-CM | POA: Diagnosis not present

## 2022-03-10 DIAGNOSIS — K219 Gastro-esophageal reflux disease without esophagitis: Secondary | ICD-10-CM | POA: Diagnosis not present

## 2022-03-10 DIAGNOSIS — M5440 Lumbago with sciatica, unspecified side: Secondary | ICD-10-CM | POA: Diagnosis not present

## 2022-03-10 DIAGNOSIS — G6289 Other specified polyneuropathies: Secondary | ICD-10-CM | POA: Diagnosis not present

## 2022-03-10 DIAGNOSIS — Z716 Tobacco abuse counseling: Secondary | ICD-10-CM | POA: Diagnosis not present

## 2022-03-10 DIAGNOSIS — M1990 Unspecified osteoarthritis, unspecified site: Secondary | ICD-10-CM | POA: Diagnosis not present

## 2022-03-10 DIAGNOSIS — G35 Multiple sclerosis: Secondary | ICD-10-CM | POA: Diagnosis not present

## 2022-03-10 DIAGNOSIS — G4089 Other seizures: Secondary | ICD-10-CM | POA: Diagnosis not present

## 2022-03-17 ENCOUNTER — Encounter (INDEPENDENT_AMBULATORY_CARE_PROVIDER_SITE_OTHER): Payer: Self-pay

## 2022-04-14 DIAGNOSIS — H353221 Exudative age-related macular degeneration, left eye, with active choroidal neovascularization: Secondary | ICD-10-CM | POA: Diagnosis not present

## 2022-04-14 DIAGNOSIS — H353211 Exudative age-related macular degeneration, right eye, with active choroidal neovascularization: Secondary | ICD-10-CM | POA: Diagnosis not present

## 2022-05-05 DIAGNOSIS — Z79899 Other long term (current) drug therapy: Secondary | ICD-10-CM | POA: Diagnosis not present

## 2022-05-05 DIAGNOSIS — G35 Multiple sclerosis: Secondary | ICD-10-CM | POA: Diagnosis not present

## 2022-05-05 DIAGNOSIS — M542 Cervicalgia: Secondary | ICD-10-CM | POA: Diagnosis not present

## 2022-05-05 DIAGNOSIS — K219 Gastro-esophageal reflux disease without esophagitis: Secondary | ICD-10-CM | POA: Diagnosis not present

## 2022-05-05 DIAGNOSIS — G6289 Other specified polyneuropathies: Secondary | ICD-10-CM | POA: Diagnosis not present

## 2022-05-05 DIAGNOSIS — M1990 Unspecified osteoarthritis, unspecified site: Secondary | ICD-10-CM | POA: Diagnosis not present

## 2022-05-05 DIAGNOSIS — G2581 Restless legs syndrome: Secondary | ICD-10-CM | POA: Diagnosis not present

## 2022-05-05 DIAGNOSIS — G8929 Other chronic pain: Secondary | ICD-10-CM | POA: Diagnosis not present

## 2022-05-05 DIAGNOSIS — M5440 Lumbago with sciatica, unspecified side: Secondary | ICD-10-CM | POA: Diagnosis not present

## 2022-05-05 DIAGNOSIS — G894 Chronic pain syndrome: Secondary | ICD-10-CM | POA: Diagnosis not present

## 2022-06-04 ENCOUNTER — Ambulatory Visit (INDEPENDENT_AMBULATORY_CARE_PROVIDER_SITE_OTHER): Payer: Medicare HMO | Admitting: Family Medicine

## 2022-06-04 ENCOUNTER — Encounter: Payer: Self-pay | Admitting: Family Medicine

## 2022-06-04 VITALS — BP 126/86 | HR 64 | Ht 62.5 in | Wt 145.0 lb

## 2022-06-04 DIAGNOSIS — E876 Hypokalemia: Secondary | ICD-10-CM | POA: Diagnosis not present

## 2022-06-04 DIAGNOSIS — E039 Hypothyroidism, unspecified: Secondary | ICD-10-CM | POA: Diagnosis not present

## 2022-06-04 DIAGNOSIS — E782 Mixed hyperlipidemia: Secondary | ICD-10-CM | POA: Diagnosis not present

## 2022-06-04 DIAGNOSIS — J301 Allergic rhinitis due to pollen: Secondary | ICD-10-CM

## 2022-06-04 DIAGNOSIS — M8000XS Age-related osteoporosis with current pathological fracture, unspecified site, sequela: Secondary | ICD-10-CM

## 2022-06-04 DIAGNOSIS — F5101 Primary insomnia: Secondary | ICD-10-CM

## 2022-06-04 DIAGNOSIS — K219 Gastro-esophageal reflux disease without esophagitis: Secondary | ICD-10-CM | POA: Diagnosis not present

## 2022-06-04 MED ORDER — PANTOPRAZOLE SODIUM 40 MG PO TBEC: 40.0000 mg | DELAYED_RELEASE_TABLET | Freq: Every day | ORAL | 1 refills | Status: DC

## 2022-06-04 MED ORDER — TRAZODONE HCL 100 MG PO TABS: 100.0000 mg | ORAL_TABLET | Freq: Every day | ORAL | 1 refills | Status: DC

## 2022-06-04 MED ORDER — ALENDRONATE SODIUM 70 MG PO TABS: ORAL_TABLET | ORAL | 1 refills | Status: DC

## 2022-06-04 MED ORDER — POTASSIUM CHLORIDE ER 10 MEQ PO TBCR: EXTENDED_RELEASE_TABLET | ORAL | 1 refills | Status: DC

## 2022-06-04 MED ORDER — MONTELUKAST SODIUM 10 MG PO TABS: 10.0000 mg | ORAL_TABLET | Freq: Every day | ORAL | 1 refills | Status: DC

## 2022-06-04 MED ORDER — LEVOTHYROXINE SODIUM 112 MCG PO TABS: 112.0000 ug | ORAL_TABLET | Freq: Every day | ORAL | 1 refills | Status: DC

## 2022-06-04 NOTE — Progress Notes (Signed)
Date:  06/04/2022   Name:  Brooke Juarez   DOB:  12/01/1950   MRN:  [id number]   Chief Complaint: Osteoporosis, Hypothyroidism, Allergic Rhinitis , Gastroesophageal Reflux, Insomnia, and hypokalemia  Gastroesophageal Reflux She reports no abdominal pain, no belching, no chest pain, no choking, no coughing, no dysphagia, no early satiety, no globus sensation, no heartburn, no hoarse voice, no nausea, no sore throat, no stridor or no wheezing. This is a chronic problem. The problem has been gradually improving. The symptoms are aggravated by certain foods (pizza/banannas). Associated symptoms include fatigue. Pertinent negatives include no anemia, melena, muscle weakness, orthopnea or weight loss. She has tried a PPI for the symptoms. The treatment provided moderate relief.  Insomnia Primary symptoms: no fragmented sleep, no sleep disturbance, no difficulty falling asleep, no somnolence, no frequent awakening, no premature morning awakening, no malaise/fatigue, no napping.   The current episode started more than one year. The problem has been gradually improving since onset. The symptoms are relieved by change in diet. The treatment provided moderate relief. PMH includes: no hypertension, depression.   Depression        This is a chronic problem.  The current episode started more than 1 year ago.   The onset quality is gradual.   The problem has been gradually improving since onset.  Associated symptoms include fatigue, helplessness, hopelessness and insomnia.  Associated symptoms include no decreased concentration, not irritable, no restlessness, no decreased interest, no appetite change, no body aches, no myalgias, no headaches, no indigestion, not sad and no suicidal ideas.     The symptoms are aggravated by nothing (medical condition).  Past treatments include nothing.  Past medical history includes thyroid problem.   Thyroid Problem Presents for follow-up visit. Symptoms include fatigue. Patient  reports no anxiety, cold intolerance, constipation, depressed mood, diaphoresis, diarrhea, dry skin, hair loss, heat intolerance, hoarse voice, leg swelling, menstrual problem, nail problem, palpitations, tremors, visual change, weight gain or weight loss. The symptoms have been stable.    Lab Results  Component Value Date   NA 140 01/11/2022   K 3.2 (L) 01/11/2022   CO2 21 (L) 01/11/2022   GLUCOSE 82 01/11/2022   BUN 8 01/11/2022   CREATININE 0.71 01/11/2022   CALCIUM 7.8 (L) 01/11/2022   EGFR 92 06/03/2021   GFRNONAA >60 01/11/2022   Lab Results  Component Value Date   CHOL 187 06/03/2021   HDL 40 06/03/2021   LDLCALC 128 (H) 06/03/2021   TRIG 106 06/03/2021   CHOLHDL 3.6 06/16/2017   Lab Results  Component Value Date   TSH 0.473 12/09/2021   No results found for: "HGBA1C" Lab Results  Component Value Date   WBC 7.0 01/11/2022   HGB 9.7 (L) 01/11/2022   HCT 30.8 (L) 01/11/2022   MCV 90.9 01/11/2022   PLT 170 01/11/2022   Lab Results  Component Value Date   ALT 13 01/09/2022   AST 18 01/09/2022   ALKPHOS 126 01/09/2022   BILITOT 1.2 01/09/2022   No results found for: "25OHVITD2", "25OHVITD3", "VD25OH"   Review of Systems  Constitutional:  Positive for fatigue. Negative for appetite change, diaphoresis, malaise/fatigue, weight gain and weight loss.  HENT:  Negative for hoarse voice and sore throat.   Respiratory:  Negative for cough, choking and wheezing.   Cardiovascular:  Negative for chest pain and palpitations.  Gastrointestinal:  Negative for abdominal pain, constipation, diarrhea, dysphagia, heartburn, melena and nausea.  Endocrine: Negative for cold intolerance and heat  intolerance.  Genitourinary:  Negative for menstrual problem.  Musculoskeletal:  Negative for myalgias and muscle weakness.  Neurological:  Negative for tremors and headaches.  Psychiatric/Behavioral:  Positive for depression. Negative for decreased concentration, sleep disturbance and  suicidal ideas. The patient has insomnia. The patient is not nervous/anxious.     Patient Active Problem List   Diagnosis Date Noted   DVT (deep venous thrombosis) ([place]) 01/09/2022   Mixed hyperlipidemia 06/07/2019   Irreducible right femoral hernia 04/28/2018   Limited mobility with chronic pain & MS 04/05/2018   Tobacco abuse 04/05/2018   MS (multiple sclerosis) ([place])    Hypokalemia 11/25/2017   Seasonal allergic rhinitis due to pollen 12/08/2016   CAD (coronary artery disease) 11/27/2014   Medicare annual wellness visit, subsequent 11/27/2014   Vitamin D deficiency 11/22/2014   Familial multiple lipoprotein-type hyperlipidemia 09/29/2014   Clinical depression 09/29/2014   Acid reflux 09/29/2014   Routine general medical examination at a health care facility 09/29/2014   Restless leg 09/29/2014   DS (disseminated sclerosis) ([place]) 09/29/2014   Nicotine addiction 09/29/2014   OP (osteoporosis) 09/29/2014   Adult hypothyroidism 10/05/2013   Collagenous colitis 10/05/2013   Hypothyroid 10/05/2013    Allergies  Allergen Reactions   Codeine Nausea And Vomiting    Past Surgical History:  Procedure Laterality Date   APPENDECTOMY Left 04/14/2018   Procedure: APPENDECTOMY;  Surgeon: Olean Ree, MD;  Location: [facility];  Service: General;  Laterality: Left;   CATARACT EXTRACTION W/PHACO Right 10/30/2014   Procedure: CATARACT EXTRACTION PHACO AND INTRAOCULAR LENS PLACEMENT ([place]);  Surgeon: Estill Cotta, MD;  Location: [facility];  Service: Ophthalmology;  Laterality: Right;  [place]  1:55.7    CATARACT EXTRACTION W/PHACO Left 06/18/2015   Procedure: CATARACT EXTRACTION PHACO AND INTRAOCULAR LENS PLACEMENT ([facility]);  Surgeon: Estill Cotta, MD;  Location: [facility];  Service: Ophthalmology;  Laterality: Left;  [place] 01:36 AP% 26.1 CDE 42.19 fluid pack lo0t # [id number]   COLONOSCOPY  2014   normal- Rein- repeat in 5 years   COLONOSCOPY WITH PROPOFOL N/A 07/09/2020   Procedure:  COLONOSCOPY WITH PROPOFOL;  Surgeon: Toledo, Benay Pike, MD;  Location: [facility];  Service: Gastroenterology;  Laterality: N/A;   EYE SURGERY     FOOT SURGERY     INGUINAL HERNIA REPAIR Right 04/14/2018   Procedure: HERNIA REPAIR INGUINAL;  Surgeon: Olean Ree, MD;  Location: [facility];  Service: General;  Laterality: Right;   NASAL SINUS SURGERY     PERIPHERAL VASCULAR THROMBECTOMY Right 01/10/2022   Procedure: PERIPHERAL VASCULAR THROMBECTOMY;  Surgeon: Algernon Huxley, MD;  Location: [facility];  Service: Cardiovascular;  Laterality: Right;   TONSILLECTOMY     TUBAL LIGATION     VAGINAL HYSTERECTOMY      Social History   Tobacco Use   Smoking status: Every Day    Packs/day: 0.75    Years: 55.00    Total pack years: 41.25    Types: Cigarettes   Smokeless tobacco: Never  Vaping Use   Vaping Use: Never used  Substance Use Topics   Alcohol use: No    Alcohol/week: 0.0 standard drinks of alcohol   Drug use: No     Medication list has been reviewed and updated.  Current Meds  Medication Sig   alendronate (FOSAMAX) 70 MG tablet TAKE 1 TABLET BY MOUTH ONCE A WEEK ON AN EMPTY STOMACH WITH  A  FULL  GLASS  OF  WATER   Alpha-Lipoic Acid 200 MG TABS  Take by mouth.   apixaban (ELIQUIS) 5 MG TABS tablet Take 1 tablet (5 mg total) by mouth 2 (two) times daily.   cholecalciferol (VITAMIN D3) 10 MCG (400 UNIT) TABS tablet Take 400 Units by mouth.   gabapentin (NEURONTIN) 300 MG capsule Take 1 capsule by mouth 4 (four) times daily. [name]   levothyroxine (EUTHYROX) 112 MCG tablet Take 1 tablet (112 mcg total) by mouth daily.   methocarbamol (ROBAXIN) 500 MG tablet SMARTSIG:0.5 Tablet(s) By Mouth Every 12 Hours PRN   montelukast (SINGULAIR) 10 MG tablet Take 1 tablet (10 mg total) by mouth at bedtime.   Multiple Vitamins-Minerals (PRESERVISION AREDS 2 PO) Take 2 capsules by mouth daily.   Omega-3 Fatty Acids (FISH OIL) 1000 MG CAPS Take by mouth.   oxyCODONE-acetaminophen  (PERCOCET/ROXICET) 5-325 MG tablet SMARTSIG:0.5 Tablet(s) By Mouth Every 12 Hours PRN   pantoprazole (PROTONIX) 40 MG tablet Take 1 tablet (40 mg total) by mouth daily.   potassium chloride (KLOR-CON) 10 MEQ tablet TAKE 1 TABLET BY MOUTH 3 TIMES PER WEEK   rOPINIRole (REQUIP) 1 MG tablet Take 2 tablets by mouth at bedtime.   sulfaSALAzine (AZULFIDINE) 500 MG EC tablet Take 500 mg by mouth 2 (two) times daily.   traZODone (DESYREL) 100 MG tablet Take 1 tablet (100 mg total) by mouth daily.   vitamin B-12 (CYANOCOBALAMIN) 500 MCG tablet Take 500 mcg by mouth every 14 (fourteen) days.        06/04/2022    9:52 AM 12/02/2021    8:54 AM 06/03/2021    8:01 AM 11/30/2020    8:02 AM  GAD 7 : Generalized Anxiety Score  Nervous, Anxious, on Edge 0 0 0 0  Control/stop worrying 0 0 0 0  Worry too much - different things 0 0 0 0  Trouble relaxing 0 0 0 0  Restless 0 0 0 0  Easily annoyed or irritable 0 0 0 0  Afraid - awful might happen 0 0 0 0  Total GAD 7 Score 0 0 0 0  Anxiety Difficulty Not difficult at all Not difficult at all Not difficult at all        06/04/2022    9:52 AM 12/02/2021    8:54 AM 11/11/2021    9:29 AM  Depression screen PHQ 2/9  Decreased Interest 0 0 0  Down, Depressed, Hopeless 0 1 0  PHQ - 2 Score 0 1 0  Altered sleeping 0 2   Tired, decreased energy 0 3   Change in appetite 0 0   Feeling bad or failure about yourself  0 0   Trouble concentrating 0 0   Moving slowly or fidgety/restless 0 3   Suicidal thoughts 0 0   PHQ-9 Score 0 9   Difficult doing work/chores Not difficult at all Not difficult at all     BP Readings from Last 3 Encounters:  06/04/22 126/86  02/21/22 120/66  01/14/22 132/80    Physical Exam Vitals and nursing note reviewed.  Constitutional:      General: She is not irritable.She is not in acute distress.    Appearance: She is not diaphoretic.  HENT:     Head: Normocephalic and atraumatic.     Right Ear: External ear normal.     Left  Ear: External ear normal.     Nose: Nose normal.     Mouth/Throat:     Mouth: Mucous membranes are moist.  Eyes:     General:  Right eye: No discharge.        Left eye: No discharge.     Conjunctiva/sclera: Conjunctivae normal.     Pupils: Pupils are equal, round, and reactive to light.  Neck:     Thyroid: No thyromegaly.     Vascular: No JVD.  Cardiovascular:     Rate and Rhythm: Normal rate and regular rhythm.     Heart sounds: Normal heart sounds. No murmur heard.    No friction rub. No gallop.  Pulmonary:     Effort: Pulmonary effort is normal.     Breath sounds: Normal breath sounds. No wheezing or rhonchi.  Abdominal:     General: Bowel sounds are normal.     Palpations: Abdomen is soft. There is no mass.     Tenderness: There is no abdominal tenderness. There is no guarding or rebound.  Musculoskeletal:        General: Normal range of motion.     Cervical back: Neck supple.  Lymphadenopathy:     Cervical: No cervical adenopathy.  Skin:    General: Skin is warm and dry.  Neurological:     Mental Status: She is alert.     Wt Readings from Last 3 Encounters:  06/04/22 145 lb (65.8 kg)  02/21/22 145 lb (65.8 kg)  01/09/22 143 lb 1.3 oz (64.9 kg)    BP 126/86   Pulse 64   Ht 5' 2.5" (1.588 m)   Wt 145 lb (65.8 kg)   LMP  (LMP Unknown) Comment: had hysterectomy  SpO2 97%   BMI 26.10 kg/m   Assessment and Plan: 1. Adult hypothyroidism Chronic.  Controlled.  Stable.  Thyroid panel will be checked with TSH and if appropriate will continue levothyroxine 112 mcg daily. - levothyroxine (EUTHYROX) 112 MCG tablet; Take 1 tablet (112 mcg total) by mouth daily.  Dispense: 90 tablet; Refill: 1 - Thyroid Panel With TSH  2. Gastroesophageal reflux disease, unspecified whether esophagitis present .  Controlled.  Stable.  Continue pantoprazole 40 mg once a day. - pantoprazole (PROTONIX) 40 MG tablet; Take 1 tablet (40 mg total) by mouth daily.  Dispense: 90 tablet;  Refill: 1  3. Hypokalemia Chronic.  Controlled.  Stable.  Will follow potassium with CMP and will likely continue potassium chloride 10 mill equivalents 3 times a week. - potassium chloride (KLOR-CON) 10 MEQ tablet; TAKE 1 TABLET BY MOUTH 3 TIMES PER WEEK  Dispense: 36 tablet; Refill: 1 - Comprehensive Metabolic Panel (CMET)  4. Age-related osteoporosis with current pathological fracture, sequela Chronic.  Controlled.  Stable.  At risk given her physique and relative lack of activity.  Will continue Fosamax 70 mg once a week. - alendronate (FOSAMAX) 70 MG tablet; TAKE 1 TABLET BY MOUTH ONCE A WEEK ON AN EMPTY STOMACH WITH  A  FULL  GLASS  OF  WATER  Dispense: 12 tablet; Refill: 1  5. Seasonal allergic rhinitis due to pollen Chronic.  Controlled.  Stable.  Continue Singulair 10 mg once a day. - montelukast (SINGULAIR) 10 MG tablet; Take 1 tablet (10 mg total) by mouth at bedtime.  Dispense: 90 tablet; Refill: 1  6. Primary insomnia Chronic.  Controlled.  Stable.  Relative relief of insomnia with trazodone 100 mg nightly. - traZODone (DESYREL) 100 MG tablet; Take 1 tablet (100 mg total) by mouth daily.  Dispense: 90 tablet; Refill: 1  7. Mixed hyperlipidemia Chronic.  Diet controlled.  Stable.  Will check lipid panel for current level of control. - Lipid  Panel With LDL/HDL Ratio     Otilio Miu, MD

## 2022-06-05 LAB — COMPREHENSIVE METABOLIC PANEL
ALT: 10 IU/L (ref 0–32)
AST: 14 IU/L (ref 0–40)
Albumin/Globulin Ratio: 2.3 — ABNORMAL HIGH (ref 1.2–2.2)
Albumin: 4.2 g/dL (ref 3.8–4.8)
Alkaline Phosphatase: 167 IU/L — ABNORMAL HIGH (ref 44–121)
BUN/Creatinine Ratio: 15 (ref 12–28)
BUN: 9 mg/dL (ref 8–27)
Bilirubin Total: 0.4 mg/dL (ref 0.0–1.2)
CO2: 22 mmol/L (ref 20–29)
Calcium: 9.5 mg/dL (ref 8.7–10.3)
Chloride: 107 mmol/L — ABNORMAL HIGH (ref 96–106)
Creatinine, Ser: 0.62 mg/dL (ref 0.57–1.00)
Globulin, Total: 1.8 g/dL (ref 1.5–4.5)
Glucose: 96 mg/dL (ref 70–99)
Potassium: 5 mmol/L (ref 3.5–5.2)
Sodium: 145 mmol/L — ABNORMAL HIGH (ref 134–144)
Total Protein: 6 g/dL (ref 6.0–8.5)
eGFR: 93 mL/min/{1.73_m2} (ref >59)

## 2022-06-05 LAB — THYROID PANEL WITH TSH
Free Thyroxine Index: 3.3 (ref 1.2–4.9)
T3 Uptake Ratio: 34 % (ref 24–39)
T4, Total: 9.8 ug/dL (ref 4.5–12.0)
TSH: 2.71 u[IU]/mL (ref 0.450–4.500)

## 2022-06-05 LAB — LIPID PANEL WITH LDL/HDL RATIO
Cholesterol, Total: 198 mg/dL (ref 100–199)
HDL: 40 mg/dL (ref >39)
LDL Chol Calc (NIH): 135 mg/dL — ABNORMAL HIGH (ref 0–99)
LDL/HDL Ratio: 3.4 ratio — ABNORMAL HIGH (ref 0.0–3.2)
Triglycerides: 125 mg/dL (ref 0–149)
VLDL Cholesterol Cal: 23 mg/dL (ref 5–40)

## 2022-06-09 DIAGNOSIS — G35 Multiple sclerosis: Secondary | ICD-10-CM | POA: Diagnosis not present

## 2022-06-09 DIAGNOSIS — G6289 Other specified polyneuropathies: Secondary | ICD-10-CM | POA: Diagnosis not present

## 2022-06-09 DIAGNOSIS — G2581 Restless legs syndrome: Secondary | ICD-10-CM | POA: Diagnosis not present

## 2022-06-09 DIAGNOSIS — M1990 Unspecified osteoarthritis, unspecified site: Secondary | ICD-10-CM | POA: Diagnosis not present

## 2022-06-09 DIAGNOSIS — M791 Myalgia, unspecified site: Secondary | ICD-10-CM | POA: Diagnosis not present

## 2022-06-09 DIAGNOSIS — M542 Cervicalgia: Secondary | ICD-10-CM | POA: Diagnosis not present

## 2022-06-09 DIAGNOSIS — K219 Gastro-esophageal reflux disease without esophagitis: Secondary | ICD-10-CM | POA: Diagnosis not present

## 2022-06-09 DIAGNOSIS — G8929 Other chronic pain: Secondary | ICD-10-CM | POA: Diagnosis not present

## 2022-06-09 DIAGNOSIS — G4089 Other seizures: Secondary | ICD-10-CM | POA: Diagnosis not present

## 2022-06-30 DIAGNOSIS — H353221 Exudative age-related macular degeneration, left eye, with active choroidal neovascularization: Secondary | ICD-10-CM | POA: Diagnosis not present

## 2022-06-30 DIAGNOSIS — H353211 Exudative age-related macular degeneration, right eye, with active choroidal neovascularization: Secondary | ICD-10-CM | POA: Diagnosis not present

## 2022-07-07 DIAGNOSIS — G4089 Other seizures: Secondary | ICD-10-CM | POA: Diagnosis not present

## 2022-07-07 DIAGNOSIS — M542 Cervicalgia: Secondary | ICD-10-CM | POA: Diagnosis not present

## 2022-07-07 DIAGNOSIS — G6289 Other specified polyneuropathies: Secondary | ICD-10-CM | POA: Diagnosis not present

## 2022-07-07 DIAGNOSIS — M62838 Other muscle spasm: Secondary | ICD-10-CM | POA: Diagnosis not present

## 2022-07-07 DIAGNOSIS — G8929 Other chronic pain: Secondary | ICD-10-CM | POA: Diagnosis not present

## 2022-07-07 DIAGNOSIS — G2581 Restless legs syndrome: Secondary | ICD-10-CM | POA: Diagnosis not present

## 2022-07-07 DIAGNOSIS — M791 Myalgia, unspecified site: Secondary | ICD-10-CM | POA: Diagnosis not present

## 2022-07-07 DIAGNOSIS — Z79891 Long term (current) use of opiate analgesic: Secondary | ICD-10-CM | POA: Diagnosis not present

## 2022-07-07 DIAGNOSIS — M1990 Unspecified osteoarthritis, unspecified site: Secondary | ICD-10-CM | POA: Diagnosis not present

## 2022-07-07 DIAGNOSIS — K219 Gastro-esophageal reflux disease without esophagitis: Secondary | ICD-10-CM | POA: Diagnosis not present

## 2022-07-21 ENCOUNTER — Other Ambulatory Visit (INDEPENDENT_AMBULATORY_CARE_PROVIDER_SITE_OTHER): Payer: Self-pay | Admitting: Vascular Surgery

## 2022-07-21 DIAGNOSIS — I82421 Acute embolism and thrombosis of right iliac vein: Secondary | ICD-10-CM

## 2022-07-22 ENCOUNTER — Encounter (INDEPENDENT_AMBULATORY_CARE_PROVIDER_SITE_OTHER): Payer: Self-pay | Admitting: Vascular Surgery

## 2022-07-22 ENCOUNTER — Ambulatory Visit (INDEPENDENT_AMBULATORY_CARE_PROVIDER_SITE_OTHER): Payer: Medicare HMO | Admitting: Vascular Surgery

## 2022-07-22 ENCOUNTER — Ambulatory Visit (INDEPENDENT_AMBULATORY_CARE_PROVIDER_SITE_OTHER): Payer: Medicare HMO

## 2022-07-22 VITALS — BP 111/65 | HR 72 | Resp 16

## 2022-07-22 DIAGNOSIS — E782 Mixed hyperlipidemia: Secondary | ICD-10-CM

## 2022-07-22 DIAGNOSIS — G35 Multiple sclerosis: Secondary | ICD-10-CM | POA: Diagnosis not present

## 2022-07-22 DIAGNOSIS — I82421 Acute embolism and thrombosis of right iliac vein: Secondary | ICD-10-CM

## 2022-07-22 DIAGNOSIS — K5090 Crohn's disease, unspecified, without complications: Secondary | ICD-10-CM | POA: Diagnosis not present

## 2022-07-22 NOTE — Assessment & Plan Note (Signed)
Duplex today shows only some chronic superficial thrombophlebitis of the small saphenous vein with resolution of the DVT.  There is reflux of the deep veins but that is not surprising given her fairly extensive DVT previously. We had a long discussion about options for ongoing anticoagulation.  Given her marked improvement and cost prohibitive nature of long-term anticoagulation even with prophylactic dose, I think it is reasonable to go to an aspirin a day at this point.  We did discuss if she has a recurrent DVT given her immobility she would likely have to be on lifelong anticoagulation which may become a cost issue.  She can wear compression socks if she develops any swelling for her venous incompetence but it current she is doing well.  I will plan to see her back in 6 months.

## 2022-07-22 NOTE — Progress Notes (Signed)
MRN : [id number]  Brooke Juarez is a 72 y.o. (1950-11-29) female who presents with chief complaint of  Chief Complaint  Patient presents with   Follow-up    Ultrasound follow up  .  History of Present Illness: Patient returns today in follow up of her DVT.  She is doing well. No leg swelling of note.  Has tolerated anticoagulation but it is likely cost prohibitive long term.  Duplex today shows only some chronic superficial thrombophlebitis of the small saphenous vein with resolution of the DVT.  There is reflux of the deep veins but that is not surprising given her fairly extensive DVT previously.  Current Outpatient Medications  Medication Sig Dispense Refill   alendronate (FOSAMAX) 70 MG tablet TAKE 1 TABLET BY MOUTH ONCE A WEEK ON AN EMPTY STOMACH WITH  A  FULL  GLASS  OF  WATER 12 tablet 1   Alpha-Lipoic Acid 200 MG TABS Take by mouth.     apixaban (ELIQUIS) 5 MG TABS tablet Take 1 tablet (5 mg total) by mouth 2 (two) times daily. 60 tablet 6   cholecalciferol (VITAMIN D3) 10 MCG (400 UNIT) TABS tablet Take 400 Units by mouth.     dronabinol (MARINOL) 2.5 MG capsule Take 2.5 mg by mouth daily as needed.     levothyroxine (EUTHYROX) 112 MCG tablet Take 1 tablet (112 mcg total) by mouth daily. 90 tablet 1   methocarbamol (ROBAXIN) 500 MG tablet SMARTSIG:0.5 Tablet(s) By Mouth Every 12 Hours PRN     montelukast (SINGULAIR) 10 MG tablet Take 1 tablet (10 mg total) by mouth at bedtime. 90 tablet 1   Multiple Vitamins-Minerals (PRESERVISION AREDS 2 PO) Take 2 capsules by mouth daily.     Omega-3 Fatty Acids (FISH OIL) 1000 MG CAPS Take by mouth.     oxyCODONE (OXY IR/ROXICODONE) 5 MG immediate release tablet Take 5 mg by mouth 2 (two) times daily.     pantoprazole (PROTONIX) 40 MG tablet Take 1 tablet (40 mg total) by mouth daily. 90 tablet 1   potassium chloride (KLOR-CON) 10 MEQ tablet TAKE 1 TABLET BY MOUTH 3 TIMES PER WEEK 36 tablet 1   sulfaSALAzine (AZULFIDINE) 500 MG EC tablet  Take 500 mg by mouth 2 (two) times daily.     traZODone (DESYREL) 100 MG tablet Take 1 tablet (100 mg total) by mouth daily. 90 tablet 1   vitamin B-12 (CYANOCOBALAMIN) 500 MCG tablet Take 500 mcg by mouth every 14 (fourteen) days.      gabapentin (NEURONTIN) 300 MG capsule Take 1 capsule by mouth 4 (four) times daily. [name]     oxyCODONE-acetaminophen (PERCOCET/ROXICET) 5-325 MG tablet SMARTSIG:0.5 Tablet(s) By Mouth Every 12 Hours PRN (Patient not taking: Reported on 07/22/2022)     rOPINIRole (REQUIP) 1 MG tablet Take 2 tablets by mouth at bedtime.     No current facility-administered medications for this visit.    Past Medical History:  Diagnosis Date   Arthritis    Crohn disease ([place])    Depression    GERD (gastroesophageal reflux disease)    Headache    Migraines   Heart murmur    Hyperlipidemia    Hypothyroidism    Macular degeneration    MS (multiple sclerosis) ([place])    Restless leg    Seizures ([place])    Tremors of nervous system     Past Surgical History:  Procedure Laterality Date   APPENDECTOMY Left 04/14/2018   Procedure: APPENDECTOMY;  Surgeon: Olean Ree,  MD;  Location: [facility];  Service: General;  Laterality: Left;   CATARACT EXTRACTION W/PHACO Right 10/30/2014   Procedure: CATARACT EXTRACTION PHACO AND INTRAOCULAR LENS PLACEMENT ([place]);  Surgeon: Estill Cotta, MD;  Location: [facility];  Service: Ophthalmology;  Laterality: Right;  [place]  1:55.7    CATARACT EXTRACTION W/PHACO Left 06/18/2015   Procedure: CATARACT EXTRACTION PHACO AND INTRAOCULAR LENS PLACEMENT ([facility]);  Surgeon: Estill Cotta, MD;  Location: [facility];  Service: Ophthalmology;  Laterality: Left;  [place] 01:36 AP% 26.1 CDE 42.19 fluid pack lo0t # [id number]   COLONOSCOPY  2014   normal- Rein- repeat in 5 years   COLONOSCOPY WITH PROPOFOL N/A 07/09/2020   Procedure: COLONOSCOPY WITH PROPOFOL;  Surgeon: Toledo, Benay Pike, MD;  Location: [facility];  Service: Gastroenterology;  Laterality: N/A;    EYE SURGERY     FOOT SURGERY     INGUINAL HERNIA REPAIR Right 04/14/2018   Procedure: HERNIA REPAIR INGUINAL;  Surgeon: Olean Ree, MD;  Location: [facility];  Service: General;  Laterality: Right;   NASAL SINUS SURGERY     PERIPHERAL VASCULAR THROMBECTOMY Right 01/10/2022   Procedure: PERIPHERAL VASCULAR THROMBECTOMY;  Surgeon: Algernon Huxley, MD;  Location: [facility];  Service: Cardiovascular;  Laterality: Right;   TONSILLECTOMY     TUBAL LIGATION     VAGINAL HYSTERECTOMY       Social History   Tobacco Use   Smoking status: Every Day    Packs/day: 0.75    Years: 55.00    Total pack years: 41.25    Types: Cigarettes   Smokeless tobacco: Never  Vaping Use   Vaping Use: Never used  Substance Use Topics   Alcohol use: No    Alcohol/week: 0.0 standard drinks of alcohol   Drug use: No      Family History  Problem Relation Age of Onset   Heart disease Mother    Stroke Mother    Cancer Mother        colon   Mitral valve prolapse Mother    Stroke Father    Breast cancer Neg Hx     Allergies  Allergen Reactions   Codeine Nausea And Vomiting    REVIEW OF SYSTEMS (Negative unless checked)   Constitutional: '[]'$ Weight loss  '[]'$ Fever  '[]'$ Chills Cardiac: '[]'$ Chest pain   '[]'$ Chest pressure   '[]'$ Palpitations   '[]'$ Shortness of breath when laying flat   '[]'$ Shortness of breath at rest   '[]'$ Shortness of breath with exertion. Vascular:  '[]'$ Pain in legs with walking   '[]'$ Pain in legs at rest   '[]'$ Pain in legs when laying flat   '[]'$ Claudication   '[]'$ Pain in feet when walking  '[]'$ Pain in feet at rest  '[]'$ Pain in feet when laying flat   '[x]'$ History of DVT   '[x]'$ Phlebitis   '[x]'$ Swelling in legs   '[]'$ Varicose veins   '[]'$ Non-healing ulcers Pulmonary:   '[]'$ Uses home oxygen   '[]'$ Productive cough   '[]'$ Hemoptysis   '[]'$ Wheeze  '[]'$ COPD   '[]'$ Asthma Neurologic:  '[]'$ Dizziness  '[]'$ Blackouts   '[x]'$ Seizures   '[]'$ History of stroke   '[]'$ History of TIA  '[]'$ Aphasia   '[]'$ Temporary blindness   '[]'$ Dysphagia   '[x]'$ Weakness or numbness in  arms   '[x]'$ Weakness or numbness in legs Musculoskeletal:  '[x]'$ Arthritis   '[]'$ Joint swelling   '[]'$ Joint pain   '[]'$ Low back pain Hematologic:  '[]'$ Easy bruising  '[]'$ Easy bleeding   '[]'$ Hypercoagulable state   '[]'$ Anemic   Gastrointestinal:  '[]'$ Blood in stool   '[]'$ Vomiting blood  '[x]'$ Gastroesophageal reflux/heartburn   '[]'$   Abdominal pain Genitourinary:  '[]'$ Chronic kidney disease   '[]'$ Difficult urination  '[]'$ Frequent urination  '[]'$ Burning with urination   '[]'$ Hematuria Skin:  '[]'$ Rashes   '[]'$ Ulcers   '[]'$ Wounds Psychological:  '[]'$ History of anxiety   '[x]'$  History of major depression.  Physical Examination  BP 111/65 (BP Location: Left Arm)   Pulse 72   Resp 16   LMP  (LMP Unknown) Comment: had hysterectomy Gen:  WD/WN, NAD Head: [place]/AT, No temporalis wasting. Ear/Nose/Throat: Hearing grossly intact, nares w/o erythema or drainage Eyes: Conjunctiva clear. Sclera non-icteric Neck: Supple.  Trachea midline Pulmonary:  Good air movement, no use of accessory muscles.  Cardiac: RRR, no JVD Vascular:  Vessel Right Left  Radial Palpable Palpable               Musculoskeletal: M/S 5/5 throughout.  No deformity or atrophy. No appreciable edema. Neurologic: Sensation grossly intact in extremities.  Symmetrical.  Speech is fluent.  Psychiatric: Judgment intact, Mood & affect appropriate for pt's clinical situation. Dermatologic: No rashes or ulcers noted.  No cellulitis or open wounds.      Labs Recent Results (from the past 2160 hour(s))  Thyroid Panel With TSH     Status: None   Collection Time: 06/04/22 11:21 AM  Result Value Ref Range   TSH 2.710 0.450 - 4.500 uIU/mL   T4, Total 9.8 4.5 - 12.0 ug/dL   T3 Uptake Ratio 34 24 - 39 %   Free Thyroxine Index 3.3 1.2 - 4.9  Comprehensive Metabolic Panel (CMET)     Status: Abnormal   Collection Time: 06/04/22 11:21 AM  Result Value Ref Range   Glucose 96 70 - 99 mg/dL   BUN 9 8 - 27 mg/dL   Creatinine, Ser 0.62 0.57 - 1.00 mg/dL   eGFR 93 >59 mL/min/1.73    BUN/Creatinine Ratio 15 12 - 28   Sodium 145 (H) 134 - 144 mmol/L   Potassium 5.0 3.5 - 5.2 mmol/L   Chloride 107 (H) 96 - 106 mmol/L   CO2 22 20 - 29 mmol/L   Calcium 9.5 8.7 - 10.3 mg/dL   Total Protein 6.0 6.0 - 8.5 g/dL   Albumin 4.2 3.8 - 4.8 g/dL   Globulin, Total 1.8 1.5 - 4.5 g/dL   Albumin/Globulin Ratio 2.3 (H) 1.2 - 2.2   Bilirubin Total 0.4 0.0 - 1.2 mg/dL   Alkaline Phosphatase 167 (H) 44 - 121 IU/L   AST 14 0 - 40 IU/L   ALT 10 0 - 32 IU/L  Lipid Panel With LDL/HDL Ratio     Status: Abnormal   Collection Time: 06/04/22 11:21 AM  Result Value Ref Range   Cholesterol, Total 198 100 - 199 mg/dL   Triglycerides 125 0 - 149 mg/dL   HDL 40 >39 mg/dL   VLDL Cholesterol Cal 23 5 - 40 mg/dL   LDL Chol Calc (NIH) 135 (H) 0 - 99 mg/dL   LDL/HDL Ratio 3.4 (H) 0.0 - 3.2 ratio    Comment:                                     LDL/HDL Ratio                                             Men  Women  1/2 Avg.Risk  1.0    1.5                                   Avg.Risk  3.6    3.2                                2X Avg.Risk  6.2    5.0                                3X Avg.Risk  8.0    6.1     Radiology No results found.  Assessment/Plan MS (multiple sclerosis) ([facility]) Limits her mobility significantly which may worsen leg swelling.   Mixed hyperlipidemia lipid control important in reducing the progression of atherosclerotic disease  DVT (deep venous thrombosis) ([facility]) Duplex today shows only some chronic superficial thrombophlebitis of the small saphenous vein with resolution of the DVT.  There is reflux of the deep veins but that is not surprising given her fairly extensive DVT previously. We had a long discussion about options for ongoing anticoagulation.  Given her marked improvement and cost prohibitive nature of long-term anticoagulation even with prophylactic dose, I think it is reasonable to go to an aspirin a day at this point.  We did discuss if  she has a recurrent DVT given her immobility she would likely have to be on lifelong anticoagulation which may become a cost issue.  She can wear compression socks if she develops any swelling for her venous incompetence but it current she is doing well.  I will plan to see her back in 6 months.    Leotis Pain, MD  07/22/2022 9:38 AM    This note was created with Dragon medical transcription system.  Any errors from dictation are purely unintentional

## 2022-08-05 DIAGNOSIS — H353221 Exudative age-related macular degeneration, left eye, with active choroidal neovascularization: Secondary | ICD-10-CM | POA: Diagnosis not present

## 2022-08-05 DIAGNOSIS — H353211 Exudative age-related macular degeneration, right eye, with active choroidal neovascularization: Secondary | ICD-10-CM | POA: Diagnosis not present

## 2022-08-06 DIAGNOSIS — M62838 Other muscle spasm: Secondary | ICD-10-CM | POA: Diagnosis not present

## 2022-08-06 DIAGNOSIS — M542 Cervicalgia: Secondary | ICD-10-CM | POA: Diagnosis not present

## 2022-08-06 DIAGNOSIS — G6289 Other specified polyneuropathies: Secondary | ICD-10-CM | POA: Diagnosis not present

## 2022-08-06 DIAGNOSIS — G4089 Other seizures: Secondary | ICD-10-CM | POA: Diagnosis not present

## 2022-08-06 DIAGNOSIS — G8929 Other chronic pain: Secondary | ICD-10-CM | POA: Diagnosis not present

## 2022-08-06 DIAGNOSIS — G35 Multiple sclerosis: Secondary | ICD-10-CM | POA: Diagnosis not present

## 2022-08-06 DIAGNOSIS — M1990 Unspecified osteoarthritis, unspecified site: Secondary | ICD-10-CM | POA: Diagnosis not present

## 2022-08-06 DIAGNOSIS — Z79891 Long term (current) use of opiate analgesic: Secondary | ICD-10-CM | POA: Diagnosis not present

## 2022-08-06 DIAGNOSIS — G2581 Restless legs syndrome: Secondary | ICD-10-CM | POA: Diagnosis not present

## 2022-08-06 DIAGNOSIS — K219 Gastro-esophageal reflux disease without esophagitis: Secondary | ICD-10-CM | POA: Diagnosis not present

## 2022-09-10 DIAGNOSIS — H353231 Exudative age-related macular degeneration, bilateral, with active choroidal neovascularization: Secondary | ICD-10-CM | POA: Diagnosis not present

## 2022-09-10 DIAGNOSIS — H353221 Exudative age-related macular degeneration, left eye, with active choroidal neovascularization: Secondary | ICD-10-CM | POA: Diagnosis not present

## 2022-09-24 DIAGNOSIS — Z79891 Long term (current) use of opiate analgesic: Secondary | ICD-10-CM | POA: Diagnosis not present

## 2022-09-24 DIAGNOSIS — M542 Cervicalgia: Secondary | ICD-10-CM | POA: Diagnosis not present

## 2022-09-24 DIAGNOSIS — K219 Gastro-esophageal reflux disease without esophagitis: Secondary | ICD-10-CM | POA: Diagnosis not present

## 2022-09-24 DIAGNOSIS — G8929 Other chronic pain: Secondary | ICD-10-CM | POA: Diagnosis not present

## 2022-09-24 DIAGNOSIS — G4089 Other seizures: Secondary | ICD-10-CM | POA: Diagnosis not present

## 2022-09-24 DIAGNOSIS — M545 Low back pain, unspecified: Secondary | ICD-10-CM | POA: Diagnosis not present

## 2022-09-24 DIAGNOSIS — G6289 Other specified polyneuropathies: Secondary | ICD-10-CM | POA: Diagnosis not present

## 2022-09-24 DIAGNOSIS — G2581 Restless legs syndrome: Secondary | ICD-10-CM | POA: Diagnosis not present

## 2022-09-24 DIAGNOSIS — M5440 Lumbago with sciatica, unspecified side: Secondary | ICD-10-CM | POA: Diagnosis not present

## 2022-09-24 DIAGNOSIS — M1990 Unspecified osteoarthritis, unspecified site: Secondary | ICD-10-CM | POA: Diagnosis not present

## 2022-09-29 DIAGNOSIS — G8929 Other chronic pain: Secondary | ICD-10-CM | POA: Diagnosis not present

## 2022-09-29 DIAGNOSIS — M542 Cervicalgia: Secondary | ICD-10-CM | POA: Diagnosis not present

## 2022-09-29 DIAGNOSIS — R2990 Unspecified symptoms and signs involving the nervous system: Secondary | ICD-10-CM | POA: Diagnosis not present

## 2022-09-29 DIAGNOSIS — Z86718 Personal history of other venous thrombosis and embolism: Secondary | ICD-10-CM | POA: Diagnosis not present

## 2022-09-29 DIAGNOSIS — R2689 Other abnormalities of gait and mobility: Secondary | ICD-10-CM | POA: Diagnosis not present

## 2022-09-29 DIAGNOSIS — F458 Other somatoform disorders: Secondary | ICD-10-CM | POA: Diagnosis not present

## 2022-09-29 DIAGNOSIS — G2581 Restless legs syndrome: Secondary | ICD-10-CM | POA: Diagnosis not present

## 2022-09-29 DIAGNOSIS — R404 Transient alteration of awareness: Secondary | ICD-10-CM | POA: Diagnosis not present

## 2022-10-21 DIAGNOSIS — H353221 Exudative age-related macular degeneration, left eye, with active choroidal neovascularization: Secondary | ICD-10-CM | POA: Diagnosis not present

## 2022-11-19 ENCOUNTER — Ambulatory Visit (INDEPENDENT_AMBULATORY_CARE_PROVIDER_SITE_OTHER): Payer: Medicare HMO

## 2022-11-19 VITALS — Ht 62.5 in | Wt 145.0 lb

## 2022-11-19 DIAGNOSIS — Z0000 Encounter for general adult medical examination without abnormal findings: Secondary | ICD-10-CM | POA: Diagnosis not present

## 2022-11-19 NOTE — Patient Instructions (Signed)
[name] , Thank you for taking time to come for your Medicare Wellness Visit. I appreciate your ongoing commitment to your health goals. Please review the following plan we discussed and let me know if I can assist you in the future.   These are the goals we discussed:  Goals      DIET - EAT MORE FRUITS AND VEGETABLES     DIET - INCREASE WATER INTAKE     Recommend to drink at least 6-8 8oz glasses of water per day.     Quit Smoking     If you wish to quit smoking, help is available. For free tobacco cessation program offerings call the [facility] at 954-136-9736 or [facility] at 801-150-3740. You may also visit [email or web address] or email [email or web address] for more information on other programs.          This is a list of the screening recommended for you and due dates:  Health Maintenance  Topic Date Due   Zoster (Shingles) Vaccine (1 of 2) Never done   Screening for Lung Cancer  Never done   DTaP/Tdap/Td vaccine (2 - Td or Tdap) 05/19/2017   COVID-19 Vaccine (4 - 2023-24 season) 01/17/2022   Flu Shot  12/18/2022   Medicare Annual Wellness Visit  11/19/2023   Colon Cancer Screening  07/09/2025   Pneumonia Vaccine  Completed   DEXA scan (bone density measurement)  Completed   Hepatitis C Screening  Completed   HPV Vaccine  Aged Out   Mammogram  Discontinued    Advanced directives: no  Conditions/risks identified: none  Next appointment: Follow up in one year for your annual wellness visit 11/25/23 @ 8:45 am by phone   Preventive Care 65 Years and Older, Female Preventive care refers to lifestyle choices and visits with your health care provider that can promote health and wellness. What does preventive care include? A yearly physical exam. This is also called an annual well check. Dental exams once or twice a year. Routine eye exams. Ask your health care provider how often you should have your eyes checked. Personal lifestyle choices,  including: Daily care of your teeth and gums. Regular physical activity. Eating a healthy diet. Avoiding tobacco and drug use. Limiting alcohol use. Practicing safe sex. Taking low-dose aspirin every day. Taking vitamin and mineral supplements as recommended by your health care provider. What happens during an annual well check? The services and screenings done by your health care provider during your annual well check will depend on your age, overall health, lifestyle risk factors, and family history of disease. Counseling  Your health care provider may ask you questions about your: Alcohol use. Tobacco use. Drug use. Emotional well-being. Home and relationship well-being. Sexual activity. Eating habits. History of falls. Memory and ability to understand (cognition). Work and work Astronomer. Reproductive health. Screening  You may have the following tests or measurements: Height, weight, and BMI. Blood pressure. Lipid and cholesterol levels. These may be checked every 5 years, or more frequently if you are over 8 years old. Skin check. Lung cancer screening. You may have this screening every year starting at age 69 if you have a 30-pack-year history of smoking and currently smoke or have quit within the past 15 years. Fecal occult blood test (FOBT) of the stool. You may have this test every year starting at age 64. Flexible sigmoidoscopy or colonoscopy. You may have a sigmoidoscopy every 5 years or a colonoscopy every 10 years  starting at age 66. Hepatitis C blood test. Hepatitis B blood test. Sexually transmitted disease (STD) testing. Diabetes screening. This is done by checking your blood sugar (glucose) after you have not eaten for a while (fasting). You may have this done every 1-3 years. Bone density scan. This is done to screen for osteoporosis. You may have this done starting at age 55. Mammogram. This may be done every 1-2 years. Talk to your health care provider  about how often you should have regular mammograms. Talk with your health care provider about your test results, treatment options, and if necessary, the need for more tests. Vaccines  Your health care provider may recommend certain vaccines, such as: Influenza vaccine. This is recommended every year. Tetanus, diphtheria, and acellular pertussis (Tdap, Td) vaccine. You may need a Td booster every 10 years. Zoster vaccine. You may need this after age 80. Pneumococcal 13-valent conjugate (PCV13) vaccine. One dose is recommended after age 69. Pneumococcal polysaccharide (PPSV23) vaccine. One dose is recommended after age 55. Talk to your health care provider about which screenings and vaccines you need and how often you need them. This information is not intended to replace advice given to you by your health care provider. Make sure you discuss any questions you have with your health care provider. Document Released: 06/01/2015 Document Revised: 01/23/2016 Document Reviewed: 03/06/2015 Elsevier Interactive Patient Education  2017 [place] Prevention in the Home Falls can cause injuries. They can happen to people of all ages. There are many things you can do to make your home safe and to help prevent falls. What can I do on the outside of my home? Regularly fix the edges of walkways and driveways and fix any cracks. Remove anything that might make you trip as you walk through a door, such as a raised step or threshold. Trim any bushes or trees on the path to your home. Use bright outdoor lighting. Clear any walking paths of anything that might make someone trip, such as rocks or tools. Regularly check to see if handrails are loose or broken. Make sure that both sides of any steps have handrails. Any raised decks and porches should have guardrails on the edges. Have any leaves, snow, or ice cleared regularly. Use sand or salt on walking paths during winter. Clean up any spills in  your garage right away. This includes oil or grease spills. What can I do in the bathroom? Use night lights. Install grab bars by the toilet and in the tub and shower. Do not use towel bars as grab bars. Use non-skid mats or decals in the tub or shower. If you need to sit down in the shower, use a plastic, non-slip stool. Keep the floor dry. Clean up any water that spills on the floor as soon as it happens. Remove soap buildup in the tub or shower regularly. Attach bath mats securely with double-sided non-slip rug tape. Do not have throw rugs and other things on the floor that can make you trip. What can I do in the bedroom? Use night lights. Make sure that you have a light by your bed that is easy to reach. Do not use any sheets or blankets that are too big for your bed. They should not hang down onto the floor. Have a firm chair that has side arms. You can use this for support while you get dressed. Do not have throw rugs and other things on the floor that can make you trip. What  can I do in the kitchen? Clean up any spills right away. Avoid walking on wet floors. Keep items that you use a lot in easy-to-reach places. If you need to reach something above you, use a strong step stool that has a grab bar. Keep electrical cords out of the way. Do not use floor polish or wax that makes floors slippery. If you must use wax, use non-skid floor wax. Do not have throw rugs and other things on the floor that can make you trip. What can I do with my stairs? Do not leave any items on the stairs. Make sure that there are handrails on both sides of the stairs and use them. Fix handrails that are broken or loose. Make sure that handrails are as long as the stairways. Check any carpeting to make sure that it is firmly attached to the stairs. Fix any carpet that is loose or worn. Avoid having throw rugs at the top or bottom of the stairs. If you do have throw rugs, attach them to the floor with carpet  tape. Make sure that you have a light switch at the top of the stairs and the bottom of the stairs. If you do not have them, ask someone to add them for you. What else can I do to help prevent falls? Wear shoes that: Do not have high heels. Have rubber bottoms. Are comfortable and fit you well. Are closed at the toe. Do not wear sandals. If you use a stepladder: Make sure that it is fully opened. Do not climb a closed stepladder. Make sure that both sides of the stepladder are locked into place. Ask someone to hold it for you, if possible. Clearly mark and make sure that you can see: Any grab bars or handrails. First and last steps. Where the edge of each step is. Use tools that help you move around (mobility aids) if they are needed. These include: Canes. Walkers. Scooters. Crutches. Turn on the lights when you go into a dark area. Replace any light bulbs as soon as they burn out. Set up your furniture so you have a clear path. Avoid moving your furniture around. If any of your floors are uneven, fix them. If there are any pets around you, be aware of where they are. Review your medicines with your doctor. Some medicines can make you feel dizzy. This can increase your chance of falling. Ask your doctor what other things that you can do to help prevent falls. This information is not intended to replace advice given to you by your health care provider. Make sure you discuss any questions you have with your health care provider. Document Released: 03/01/2009 Document Revised: 10/11/2015 Document Reviewed: 06/09/2014 Elsevier Interactive Patient Education  2017 [employer].

## 2022-11-19 NOTE — Progress Notes (Signed)
Subjective:   Brooke Juarez is a 72 y.o. female who presents for Medicare Annual (Subsequent) preventive examination.  Visit Complete: Virtual  I connected with  Blima Singer on 11/19/22 by a audio enabled telemedicine application and verified that I am speaking with the correct person using two identifiers.  Patient Location: Home  Provider Location: Office/Clinic  I discussed the limitations of evaluation and management by telemedicine. The patient expressed understanding and agreed to proceed.   Review of Systems     Cardiac Risk Factors include: advanced age (>67men, >68 women);dyslipidemia;smoking/ tobacco exposure;sedentary lifestyle     Objective:    Today's Vitals   11/19/22 0903 11/19/22 0922  Weight:  145 lb (65.8 kg)  Height:  5' 2.5" (1.588 m)  PainSc: 4     Body mass index is 26.1 kg/m.     11/19/2022    9:12 AM 01/10/2022   11:57 AM 01/09/2022    4:47 PM 11/11/2021    9:30 AM 11/07/2020    9:33 AM 07/09/2020    8:56 AM 11/07/2019    9:37 AM  Advanced Directives  Does Patient Have a Medical Advance Directive? No No No No No No No  Would patient like information on creating a medical advance directive? No - Patient declined No - Patient declined No - Patient declined No - Patient declined Yes (MAU/Ambulatory/Procedural Areas - Information given)  No - Patient declined    Current Medications (verified) Outpatient Encounter Medications as of 11/19/2022  Medication Sig   alendronate (FOSAMAX) 70 MG tablet TAKE 1 TABLET BY MOUTH ONCE A WEEK ON AN EMPTY STOMACH WITH  A  FULL  GLASS  OF  WATER   Alpha-Lipoic Acid 200 MG TABS Take by mouth.   aspirin EC 81 MG tablet Take 81 mg by mouth daily. Swallow whole.   cholecalciferol (VITAMIN D3) 10 MCG (400 UNIT) TABS tablet Take 400 Units by mouth.   levothyroxine (EUTHYROX) 112 MCG tablet Take 1 tablet (112 mcg total) by mouth daily.   methocarbamol (ROBAXIN) 500 MG tablet SMARTSIG:0.5 Tablet(s) By Mouth Every 12 Hours  PRN   montelukast (SINGULAIR) 10 MG tablet Take 1 tablet (10 mg total) by mouth at bedtime.   Multiple Vitamins-Minerals (PRESERVISION AREDS 2 PO) Take 2 capsules by mouth daily.   Omega-3 Fatty Acids (FISH OIL) 1000 MG CAPS Take by mouth.   oxyCODONE (OXY IR/ROXICODONE) 5 MG immediate release tablet Take 5 mg by mouth 2 (two) times daily.   pantoprazole (PROTONIX) 40 MG tablet Take 1 tablet (40 mg total) by mouth daily.   potassium chloride (KLOR-CON) 10 MEQ tablet TAKE 1 TABLET BY MOUTH 3 TIMES PER WEEK   sulfaSALAzine (AZULFIDINE) 500 MG EC tablet Take 500 mg by mouth 2 (two) times daily.   traZODone (DESYREL) 100 MG tablet Take 1 tablet (100 mg total) by mouth daily.   vitamin B-12 (CYANOCOBALAMIN) 500 MCG tablet Take 500 mcg by mouth every 14 (fourteen) days.    apixaban (ELIQUIS) 5 MG TABS tablet Take 1 tablet (5 mg total) by mouth 2 (two) times daily. (Patient not taking: Reported on 11/19/2022)   dronabinol (MARINOL) 2.5 MG capsule Take 2.5 mg by mouth daily as needed. (Patient not taking: Reported on 11/19/2022)   gabapentin (NEURONTIN) 300 MG capsule Take 1 capsule by mouth 4 (four) times daily. Sherryll Burger   oxyCODONE-acetaminophen (PERCOCET/ROXICET) 5-325 MG tablet SMARTSIG:0.5 Tablet(s) By Mouth Every 12 Hours PRN (Patient not taking: Reported on 07/22/2022)   rOPINIRole (REQUIP) 1  MG tablet Take 2 tablets by mouth at bedtime.   No facility-administered encounter medications on file as of 11/19/2022.    Allergies (verified) Codeine   History: Past Medical History:  Diagnosis Date   Arthritis    Crohn disease (HCC)    Depression    GERD (gastroesophageal reflux disease)    Headache    Migraines   Heart murmur    Hyperlipidemia    Hypothyroidism    Macular degeneration    MS (multiple sclerosis) (HCC)    Restless leg    Seizures (HCC)    Tremors of nervous system    Past Surgical History:  Procedure Laterality Date   APPENDECTOMY Left 04/14/2018   Procedure: APPENDECTOMY;   Surgeon: Henrene Dodge, MD;  Location: [facility];  Service: General;  Laterality: Left;   CATARACT EXTRACTION W/PHACO Right 10/30/2014   Procedure: CATARACT EXTRACTION PHACO AND INTRAOCULAR LENS PLACEMENT (IOC);  Surgeon: Sallee Lange, MD;  Location: [facility];  Service: Ophthalmology;  Laterality: Right;  [place]  1:55.7    CATARACT EXTRACTION W/PHACO Left 06/18/2015   Procedure: CATARACT EXTRACTION PHACO AND INTRAOCULAR LENS PLACEMENT (IOC);  Surgeon: Sallee Lange, MD;  Location: [facility];  Service: Ophthalmology;  Laterality: Left;  [place] 01:36 AP% 26.1 CDE 42.19 fluid pack lo0t # [id number] H   COLONOSCOPY  2014   normal- Rein- repeat in 5 years   COLONOSCOPY WITH PROPOFOL N/A 07/09/2020   Procedure: COLONOSCOPY WITH PROPOFOL;  Surgeon: Toledo, Boykin Nearing, MD;  Location: [facility];  Service: Gastroenterology;  Laterality: N/A;   EYE SURGERY     FOOT SURGERY     INGUINAL HERNIA REPAIR Right 04/14/2018   Procedure: HERNIA REPAIR INGUINAL;  Surgeon: Henrene Dodge, MD;  Location: [facility];  Service: General;  Laterality: Right;   NASAL SINUS SURGERY     PERIPHERAL VASCULAR THROMBECTOMY Right 01/10/2022   Procedure: PERIPHERAL VASCULAR THROMBECTOMY;  Surgeon: Annice Needy, MD;  Location: [facility];  Service: Cardiovascular;  Laterality: Right;   TONSILLECTOMY     TUBAL LIGATION     VAGINAL HYSTERECTOMY     Family History  Problem Relation Age of Onset   Heart disease Mother    Stroke Mother    Cancer Mother        colon   Mitral valve prolapse Mother    Stroke Father    Breast cancer Neg Hx    Social History   Socioeconomic History   Marital status: Married    Spouse name: Not on file   Number of children: 3   Years of education: some college   Highest education level: 12th grade  Occupational History   Occupation: Retired  Tobacco Use   Smoking status: Every Day    Packs/day: 0.75    Years: 55.00    Additional pack years: 0.00    Total pack years: 41.25     Types: Cigarettes   Smokeless tobacco: Never  Vaping Use   Vaping Use: Never used  Substance and Sexual Activity   Alcohol use: No    Alcohol/week: 0.0 standard drinks of alcohol   Drug use: No   Sexual activity: Not on file  Other Topics Concern   Not on file  Social History Narrative   Not on file   Social Determinants of Health   Financial Resource Strain: Low Risk  (11/19/2022)   Overall Financial Resource Strain (CARDIA)    Difficulty of Paying Living Expenses: Not hard at all  Food Insecurity: No Food  Insecurity (11/19/2022)   Hunger Vital Sign    Worried About Running Out of Food in the Last Year: Never true    Ran Out of Food in the Last Year: Never true  Transportation Needs: No Transportation Needs (11/19/2022)   PRAPARE - Administrator, Civil Service (Medical): No    Lack of Transportation (Non-Medical): No  Physical Activity: Inactive (11/19/2022)   Exercise Vital Sign    Days of Exercise per Week: 0 days    Minutes of Exercise per Session: 0 min  Stress: No Stress Concern Present (11/19/2022)   [employer] of Occupational Health - Occupational Stress Questionnaire    Feeling of Stress : Not at all  Social Connections: Moderately Integrated (11/19/2022)   Social Connection and Isolation Panel [NHANES]    Frequency of Communication with Friends and Family: Three times a week    Frequency of Social Gatherings with Friends and Family: Twice a week    Attends Religious Services: Never    Database administrator or Organizations: Yes    Attends Banker Meetings: Never    Marital Status: Married    Tobacco Counseling Ready to quit: Not Answered Counseling given: Not Answered   Clinical Intake:  Pre-visit preparation completed: Yes  Pain : 0-10 Pain Score: 4  Pain Type: Chronic pain Pain Location: Leg Pain Orientation: Right, Left Pain Radiating Towards: every day- legs, feet, neck down spine     Diabetes: No  How often do you  need to have someone help you when you read instructions, pamphlets, or other written materials from your doctor or pharmacy?: 1 - Never  Interpreter Needed?: No  Information entered by :: Kennedy Bucker, LPN   Activities of Daily Living    11/19/2022    9:13 AM 01/09/2022   10:00 PM  In your present state of health, do you have any difficulty performing the following activities:  Hearing? 0 0  Vision? 1 1  Difficulty concentrating or making decisions? 0 0  Walking or climbing stairs? 1 1  Dressing or bathing? 1 1  Doing errands, shopping? 1 0  Preparing Food and eating ? Y   Using the Toilet? Y   In the past six months, have you accidently leaked urine? N   Do you have problems with loss of bowel control? N   Managing your Medications? N   Managing your Finances? N   Housekeeping or managing your Housekeeping? Y     Patient Care Team: Duanne Limerick, MD as PCP - General (Family Medicine) Lonell Face, MD as Consulting Physician (Neurology) Ronita Hipps, MD as Referring Physician (Pain Medicine) Wynona Canes (Gastroenterology)  Indicate any recent Medical Services you may have received from other than [facility] providers in the past year (date may be approximate).     Assessment:   This is a routine wellness examination for Aftyn.  Hearing/Vision screen Hearing Screening - Comments:: No aids Vision Screening - Comments:: No glasses, going blind- Dr.Yang  Dietary issues and exercise activities discussed:     Goals Addressed             This Visit's Progress    DIET - EAT MORE FRUITS AND VEGETABLES         Depression Screen    11/19/2022    9:10 AM 06/04/2022    9:52 AM 12/02/2021    8:54 AM 11/11/2021    9:29 AM 06/03/2021    8:00 AM 11/30/2020  8:01 AM 11/07/2020    9:32 AM  PHQ 2/9 Scores  PHQ - 2 Score 1 0 1 0 0 0 0  PHQ- 9 Score 1 0 9  0 0     Fall Risk    11/19/2022    9:13 AM 12/02/2021    8:53 AM 11/11/2021    9:32 AM 11/30/2020    8:01  AM 11/07/2020    9:34 AM  Fall Risk   Falls in the past year? 0 0 0 1 1  Number falls in past yr: 0 0 0 1 0  Injury with Fall? 0 0 0 0 0  Risk for fall due to : No Fall Risks No Fall Risks Impaired balance/gait;Impaired mobility;Impaired vision History of fall(s) Impaired balance/gait  Follow up Falls prevention discussed;Falls evaluation completed Falls evaluation completed Falls prevention discussed Falls evaluation completed Falls prevention discussed    MEDICARE RISK AT HOME:  Medicare Risk at Home - 11/19/22 0913     Any stairs in or around the home? Yes    If so, are there any without handrails? No    Home free of loose throw rugs in walkways, pet beds, electrical cords, etc? Yes    Adequate lighting in your home to reduce risk of falls? Yes    Life alert? No    Use of a cane, walker or w/c? Yes   scooter at home, w/c otherwise   Grab bars in the bathroom? Yes    Shower chair or bench in shower? Yes    Elevated toilet seat or a handicapped toilet? No             TIMED UP AND GO:  Was the test performed?  No    Cognitive Function:        11/19/2022    9:14 AM 11/07/2019    9:40 AM 11/03/2018    9:36 AM 07/16/2017    8:45 AM  6CIT Screen  What Year? 0 points 0 points 0 points 0 points  What month? 0 points 0 points 0 points 0 points  What time? 0 points 0 points 0 points 0 points  Count back from 20 0 points 0 points 0 points 0 points  Months in reverse 0 points 0 points 0 points 0 points  Repeat phrase 0 points 0 points 0 points 0 points  Total Score 0 points 0 points 0 points 0 points    Immunizations Immunization History  Administered Date(s) Administered   Fluad Quad(high Dose 65+) 06/07/2019, 02/06/2020   Influenza, High Dose Seasonal PF 04/09/2018   Influenza,inj,Quad PF,6+ Mos 06/16/2017   PFIZER(Purple Top)SARS-COV-2 Vaccination 07/11/2019, 08/01/2019, 02/01/2020   Pneumococcal Conjugate-13 05/25/2014   Pneumococcal Polysaccharide-23 05/20/2007,  05/25/2014, 12/08/2016   Tdap 05/20/2007    TDAP status: Due, Education has been provided regarding the importance of this vaccine. Advised may receive this vaccine at local pharmacy or Health Dept. Aware to provide a copy of the vaccination record if obtained from local pharmacy or Health Dept. Verbalized acceptance and understanding.  Flu Vaccine status: Declined, Education has been provided regarding the importance of this vaccine but patient still declined. Advised may receive this vaccine at local pharmacy or Health Dept. Aware to provide a copy of the vaccination record if obtained from local pharmacy or Health Dept. Verbalized acceptance and understanding.  Pneumococcal vaccine status: Up to date  Covid-19 vaccine status: Completed vaccines  Qualifies for Shingles Vaccine? Yes   Zostavax completed No   Shingrix Completed?: No.  Education has been provided regarding the importance of this vaccine. Patient has been advised to call insurance company to determine out of pocket expense if they have not yet received this vaccine. Advised may also receive vaccine at local pharmacy or Health Dept. Verbalized acceptance and understanding.  Screening Tests Health Maintenance  Topic Date Due   Zoster Vaccines- Shingrix (1 of 2) Never done   Lung Cancer Screening  Never done   DTaP/Tdap/Td (2 - Td or Tdap) 05/19/2017   COVID-19 Vaccine (4 - 2023-24 season) 01/17/2022   INFLUENZA VACCINE  12/18/2022   Medicare Annual Wellness (AWV)  11/19/2023   Colonoscopy  07/09/2025   Pneumonia Vaccine 59+ Years old  Completed   DEXA SCAN  Completed   Hepatitis C Screening  Completed   HPV VACCINES  Aged Out   MAMMOGRAM  Discontinued    Health Maintenance  Health Maintenance Due  Topic Date Due   Zoster Vaccines- Shingrix (1 of 2) Never done   Lung Cancer Screening  Never done   DTaP/Tdap/Td (2 - Td or Tdap) 05/19/2017   COVID-19 Vaccine (4 - 2023-24 season) 01/17/2022    Colorectal cancer  screening: Type of screening: Colonoscopy. Completed 07/09/20. Repeat every 5 years  Mammogram status: No longer required due to age.  Bone Density status: Completed 06/27/14. Results reflect: Bone density results: OSTEOPOROSIS. Repeat every 2 years.- declined referral  Lung Cancer Screening: (Low Dose CT Chest recommended if Age 50-80 years, 20 pack-year currently smoking OR have quit w/in 15years.) does not qualify.   Additional Screening:  Hepatitis C Screening: does qualify; Completed 12/08/16  Vision Screening: Recommended annual ophthalmology exams for early detection of glaucoma and other disorders of the eye. Is the patient up to date with their annual eye exam?  Yes  Who is the provider or what is the name of the office in which the patient attends annual eye exams? Dr.Yang If pt is not established with a provider, would they like to be referred to a provider to establish care? No .   Dental Screening: Recommended annual dental exams for proper oral hygiene   Community Resource Referral / Chronic Care Management: CRR required this visit?  No   CCM required this visit?  No     Plan:     I have personally reviewed and noted the following in the patient's chart:   Medical and social history Use of alcohol, tobacco or illicit drugs  Current medications and supplements including opioid prescriptions. Patient is not currently taking opioid prescriptions. Functional ability and status Nutritional status Physical activity Advanced directives List of other physicians Hospitalizations, surgeries, and ER visits in previous 12 months Vitals Screenings to include cognitive, depression, and falls Referrals and appointments  In addition, I have reviewed and discussed with patient certain preventive protocols, quality metrics, and best practice recommendations. A written personalized care plan for preventive services as well as general preventive health recommendations were provided  to patient.     Hal Hope, LPN   05/24/1094   After Visit Summary: (MyChart) Due to this being a telephonic visit, the after visit summary with patients personalized plan was offered to patient via MyChart   Nurse Notes: none

## 2022-12-03 ENCOUNTER — Ambulatory Visit: Payer: Medicare HMO | Admitting: Family Medicine

## 2022-12-05 ENCOUNTER — Encounter: Payer: Self-pay | Admitting: Family Medicine

## 2022-12-05 ENCOUNTER — Ambulatory Visit (INDEPENDENT_AMBULATORY_CARE_PROVIDER_SITE_OTHER): Payer: Medicare HMO | Admitting: Family Medicine

## 2022-12-05 DIAGNOSIS — F5101 Primary insomnia: Secondary | ICD-10-CM | POA: Diagnosis not present

## 2022-12-05 DIAGNOSIS — K219 Gastro-esophageal reflux disease without esophagitis: Secondary | ICD-10-CM | POA: Diagnosis not present

## 2022-12-05 DIAGNOSIS — E876 Hypokalemia: Secondary | ICD-10-CM

## 2022-12-05 DIAGNOSIS — M8000XS Age-related osteoporosis with current pathological fracture, unspecified site, sequela: Secondary | ICD-10-CM | POA: Diagnosis not present

## 2022-12-05 DIAGNOSIS — E039 Hypothyroidism, unspecified: Secondary | ICD-10-CM

## 2022-12-05 DIAGNOSIS — J301 Allergic rhinitis due to pollen: Secondary | ICD-10-CM

## 2022-12-05 MED ORDER — MONTELUKAST SODIUM 10 MG PO TABS: 10.0000 mg | ORAL_TABLET | Freq: Every day | ORAL | Status: DC

## 2022-12-05 MED ORDER — ALENDRONATE SODIUM 70 MG PO TABS: ORAL_TABLET | ORAL | Status: DC

## 2022-12-05 MED ORDER — TRAZODONE HCL 100 MG PO TABS: 100.0000 mg | ORAL_TABLET | Freq: Every day | ORAL | Status: AC

## 2022-12-05 MED ORDER — POTASSIUM CHLORIDE ER 10 MEQ PO TBCR: EXTENDED_RELEASE_TABLET | ORAL | Status: DC

## 2022-12-05 MED ORDER — PANTOPRAZOLE SODIUM 40 MG PO TBEC: 40.0000 mg | DELAYED_RELEASE_TABLET | Freq: Every day | ORAL | Status: AC

## 2022-12-05 MED ORDER — LEVOTHYROXINE SODIUM 112 MCG PO TABS: 112.0000 ug | ORAL_TABLET | Freq: Every day | ORAL | Status: DC

## 2022-12-05 NOTE — Progress Notes (Signed)
Date:  12/05/2022   Name:  Brooke Juarez   DOB:  11-Jul-1950   MRN:  [id number]   Chief Complaint: No chief complaint on file.  HPI  Lab Results  Component Value Date   NA 145 (H) 06/04/2022   K 5.0 06/04/2022   CO2 22 06/04/2022   GLUCOSE 96 06/04/2022   BUN 9 06/04/2022   CREATININE 0.62 06/04/2022   CALCIUM 9.5 06/04/2022   EGFR 93 06/04/2022   GFRNONAA >60 01/11/2022   Lab Results  Component Value Date   CHOL 198 06/04/2022   HDL 40 06/04/2022   LDLCALC 135 (H) 06/04/2022   TRIG 125 06/04/2022   CHOLHDL 3.6 06/16/2017   Lab Results  Component Value Date   TSH 2.710 06/04/2022   No results found for: "HGBA1C" Lab Results  Component Value Date   WBC 7.0 01/11/2022   HGB 9.7 (L) 01/11/2022   HCT 30.8 (L) 01/11/2022   MCV 90.9 01/11/2022   PLT 170 01/11/2022   Lab Results  Component Value Date   ALT 10 06/04/2022   AST 14 06/04/2022   ALKPHOS 167 (H) 06/04/2022   BILITOT 0.4 06/04/2022   No results found for: "25OHVITD2", "25OHVITD3", "VD25OH"   Review of Systems  Patient Active Problem List   Diagnosis Date Noted   DVT (deep venous thrombosis) (HCC) 01/09/2022   Mixed hyperlipidemia 06/07/2019   Irreducible right femoral hernia 04/28/2018   Limited mobility with chronic pain & MS 04/05/2018   Tobacco abuse 04/05/2018   MS (multiple sclerosis) (HCC)    Hypokalemia 11/25/2017   Seasonal allergic rhinitis due to pollen 12/08/2016   CAD (coronary artery disease) 11/27/2014   Medicare annual wellness visit, subsequent 11/27/2014   Vitamin D deficiency 11/22/2014   Familial multiple lipoprotein-type hyperlipidemia 09/29/2014   Clinical depression 09/29/2014   Acid reflux 09/29/2014   Routine general medical examination at a health care facility 09/29/2014   Restless leg 09/29/2014   DS (disseminated sclerosis) (HCC) 09/29/2014   Nicotine addiction 09/29/2014   OP (osteoporosis) 09/29/2014   Adult hypothyroidism 10/05/2013   Collagenous colitis  10/05/2013   Hypothyroid 10/05/2013    Allergies  Allergen Reactions   Codeine Nausea And Vomiting    Past Surgical History:  Procedure Laterality Date   APPENDECTOMY Left 04/14/2018   Procedure: APPENDECTOMY;  Surgeon: Henrene Dodge, MD;  Location: [facility];  Service: General;  Laterality: Left;   CATARACT EXTRACTION W/PHACO Right 10/30/2014   Procedure: CATARACT EXTRACTION PHACO AND INTRAOCULAR LENS PLACEMENT (IOC);  Surgeon: Sallee Lange, MD;  Location: [facility];  Service: Ophthalmology;  Laterality: Right;  [place]  1:55.7    CATARACT EXTRACTION W/PHACO Left 06/18/2015   Procedure: CATARACT EXTRACTION PHACO AND INTRAOCULAR LENS PLACEMENT (IOC);  Surgeon: Sallee Lange, MD;  Location: [facility];  Service: Ophthalmology;  Laterality: Left;  [place] 01:36 AP% 26.1 CDE 42.19 fluid pack lo0t # [id number] H   COLONOSCOPY  2014   normal- Rein- repeat in 5 years   COLONOSCOPY WITH PROPOFOL N/A 07/09/2020   Procedure: COLONOSCOPY WITH PROPOFOL;  Surgeon: Toledo, Boykin Nearing, MD;  Location: [facility];  Service: Gastroenterology;  Laterality: N/A;   EYE SURGERY     FOOT SURGERY     INGUINAL HERNIA REPAIR Right 04/14/2018   Procedure: HERNIA REPAIR INGUINAL;  Surgeon: Henrene Dodge, MD;  Location: [facility];  Service: General;  Laterality: Right;   NASAL SINUS SURGERY     PERIPHERAL VASCULAR THROMBECTOMY Right 01/10/2022   Procedure: PERIPHERAL VASCULAR THROMBECTOMY;  Surgeon: Annice Needy, MD;  Location: [facility];  Service: Cardiovascular;  Laterality: Right;   TONSILLECTOMY     TUBAL LIGATION     VAGINAL HYSTERECTOMY      Social History   Tobacco Use   Smoking status: Every Day    Current packs/day: 0.75    Average packs/day: 0.8 packs/day for 55.0 years (41.3 ttl pk-yrs)    Types: Cigarettes   Smokeless tobacco: Never  Vaping Use   Vaping status: Never Used  Substance Use Topics   Alcohol use: No    Alcohol/week: 0.0 standard drinks of alcohol   Drug use: No      Medication list has been reviewed and updated.  No outpatient medications have been marked as taking for the 12/05/22 encounter (Office Visit) with Duanne Limerick, MD.       12/05/2022   10:54 AM 06/04/2022    9:52 AM 12/02/2021    8:54 AM 06/03/2021    8:01 AM  GAD 7 : Generalized Anxiety Score  Nervous, Anxious, on Edge 0 0 0 0  Control/stop worrying 0 0 0 0  Worry too much - different things 0 0 0 0  Trouble relaxing 0 0 0 0  Restless 0 0 0 0  Easily annoyed or irritable 0 0 0 0  Afraid - awful might happen 0 0 0 0  Total GAD 7 Score 0 0 0 0  Anxiety Difficulty Not difficult at all Not difficult at all Not difficult at all Not difficult at all       12/05/2022   10:54 AM 11/19/2022    9:10 AM 06/04/2022    9:52 AM  Depression screen PHQ 2/9  Decreased Interest 0 0 0  Down, Depressed, Hopeless 0 1 0  PHQ - 2 Score 0 1 0  Altered sleeping 0 0 0  Tired, decreased energy 0 0 0  Change in appetite 0 0 0  Feeling bad or failure about yourself  0 0 0  Trouble concentrating 0 0 0  Moving slowly or fidgety/restless 0 0 0  Suicidal thoughts 0 0 0  PHQ-9 Score 0 1 0  Difficult doing work/chores Not difficult at all Not difficult at all Not difficult at all    BP Readings from Last 3 Encounters:  12/05/22 122/62  07/22/22 111/65  06/04/22 126/86    Physical Exam  Wt Readings from Last 3 Encounters:  12/05/22 148 lb (67.1 kg)  11/19/22 145 lb (65.8 kg)  06/04/22 145 lb (65.8 kg)    BP 122/62   Pulse 72   Ht 5\' 2"  (1.575 m)   Wt 148 lb (67.1 kg)   LMP  (LMP Unknown) Comment: had hysterectomy  SpO2 96%   BMI 27.07 kg/m   Assessment and Plan:  1. Age-related osteoporosis with current pathological fracture, sequela See written note - alendronate (FOSAMAX) 70 MG tablet; TAKE 1 TABLET BY MOUTH ONCE A WEEK ON AN EMPTY STOMACH WITH  A  FULL  GLASS  OF  WATER  2. Adult hypothyroidism  - levothyroxine (EUTHYROX) 112 MCG tablet; Take 1 tablet (112 mcg total) by  mouth daily. - TSH  3. Seasonal allergic rhinitis due to pollen  - montelukast (SINGULAIR) 10 MG tablet; Take 1 tablet (10 mg total) by mouth at bedtime.  4. Gastroesophageal reflux disease, unspecified whether esophagitis present  - pantoprazole (PROTONIX) 40 MG tablet; Take 1 tablet (40 mg total) by mouth daily.  5. Hypokalemia  - potassium  chloride (KLOR-CON) 10 MEQ tablet; TAKE 1 TABLET BY MOUTH 3 TIMES PER WEEK - Renal Function Panel  6. Primary insomnia  - traZODone (DESYREL) 100 MG tablet; Take 1 tablet (100 mg total) by mouth daily.    Elizabeth Sauer, MD

## 2022-12-08 DIAGNOSIS — M5440 Lumbago with sciatica, unspecified side: Secondary | ICD-10-CM | POA: Diagnosis not present

## 2022-12-08 DIAGNOSIS — Z79891 Long term (current) use of opiate analgesic: Secondary | ICD-10-CM | POA: Diagnosis not present

## 2022-12-08 DIAGNOSIS — M1990 Unspecified osteoarthritis, unspecified site: Secondary | ICD-10-CM | POA: Diagnosis not present

## 2022-12-08 DIAGNOSIS — G35 Multiple sclerosis: Secondary | ICD-10-CM | POA: Diagnosis not present

## 2022-12-08 DIAGNOSIS — M542 Cervicalgia: Secondary | ICD-10-CM | POA: Diagnosis not present

## 2022-12-08 DIAGNOSIS — G4089 Other seizures: Secondary | ICD-10-CM | POA: Diagnosis not present

## 2022-12-08 DIAGNOSIS — G894 Chronic pain syndrome: Secondary | ICD-10-CM | POA: Diagnosis not present

## 2022-12-08 DIAGNOSIS — M62838 Other muscle spasm: Secondary | ICD-10-CM | POA: Diagnosis not present

## 2022-12-08 DIAGNOSIS — M791 Myalgia, unspecified site: Secondary | ICD-10-CM | POA: Diagnosis not present

## 2022-12-08 DIAGNOSIS — G2581 Restless legs syndrome: Secondary | ICD-10-CM | POA: Diagnosis not present

## 2022-12-09 DIAGNOSIS — H353211 Exudative age-related macular degeneration, right eye, with active choroidal neovascularization: Secondary | ICD-10-CM | POA: Diagnosis not present

## 2022-12-09 DIAGNOSIS — H353231 Exudative age-related macular degeneration, bilateral, with active choroidal neovascularization: Secondary | ICD-10-CM | POA: Diagnosis not present

## 2022-12-09 DIAGNOSIS — H353221 Exudative age-related macular degeneration, left eye, with active choroidal neovascularization: Secondary | ICD-10-CM | POA: Diagnosis not present

## 2022-12-16 DIAGNOSIS — E039 Hypothyroidism, unspecified: Secondary | ICD-10-CM | POA: Diagnosis not present

## 2022-12-16 DIAGNOSIS — E876 Hypokalemia: Secondary | ICD-10-CM | POA: Diagnosis not present

## 2022-12-19 ENCOUNTER — Telehealth: Payer: Self-pay | Admitting: Family Medicine

## 2022-12-19 ENCOUNTER — Telehealth: Payer: Self-pay

## 2022-12-19 NOTE — Telephone Encounter (Signed)
Copied from CRM [id number]. Topic: General - Other >> Dec 19, 2022  1:20 PM Dominique E wrote: Reason for CRM: Pt called requesting to speak to Delice Bison regarding their positive covid diagnosis, her symptoms include: fatigue, no appetite, not terribly bad at this time but afraid things could get worse.   Best contact: [phone]  Says this applies to her husband as well

## 2022-12-19 NOTE — Telephone Encounter (Signed)
Called pt and told her she needs to push the fluids. She stated Mr. Leisinger is "worse than I am." I expressed concern for him getting dehydrated. I asked that she send me a mychart message Monday am and let me know how they are doing

## 2022-12-22 ENCOUNTER — Encounter: Payer: Self-pay | Admitting: Family Medicine

## 2022-12-23 ENCOUNTER — Encounter: Payer: Self-pay | Admitting: Family Medicine

## 2022-12-23 ENCOUNTER — Ambulatory Visit: Payer: Medicare HMO | Admitting: Family Medicine

## 2022-12-23 VITALS — BP 138/82 | HR 73 | Temp 99.0°F | Ht 62.0 in | Wt 148.0 lb

## 2022-12-23 DIAGNOSIS — U099 Post covid-19 condition, unspecified: Secondary | ICD-10-CM

## 2022-12-23 DIAGNOSIS — E860 Dehydration: Secondary | ICD-10-CM

## 2022-12-23 NOTE — Progress Notes (Signed)
Date:  12/23/2022   Name:  Brooke Juarez   DOB:  1950-11-08   MRN:  [id number]   Chief Complaint: Sinusitis (Patient diagnosed with Covid last week. Drainage with cough. No congestion. Patient said doing a lot better than last week. )  Sinusitis The current episode started more than 1 year ago. The problem has been gradually improving since onset. Pertinent negatives include no congestion, coughing, neck pain, shortness of breath, sinus pressure or sneezing. (Postnasal drainage)    Lab Results  Component Value Date   NA 142 12/16/2022   K 5.1 12/16/2022   CO2 23 12/16/2022   GLUCOSE 96 12/16/2022   BUN 8 12/16/2022   CREATININE 0.76 12/16/2022   CALCIUM 9.5 12/16/2022   EGFR 82 12/16/2022   GFRNONAA >60 01/11/2022   Lab Results  Component Value Date   CHOL 198 06/04/2022   HDL 40 06/04/2022   LDLCALC 135 (H) 06/04/2022   TRIG 125 06/04/2022   CHOLHDL 3.6 06/16/2017   Lab Results  Component Value Date   TSH 1.130 12/16/2022   No results found for: "HGBA1C" Lab Results  Component Value Date   WBC 7.0 01/11/2022   HGB 9.7 (L) 01/11/2022   HCT 30.8 (L) 01/11/2022   MCV 90.9 01/11/2022   PLT 170 01/11/2022   Lab Results  Component Value Date   ALT 10 06/04/2022   AST 14 06/04/2022   ALKPHOS 167 (H) 06/04/2022   BILITOT 0.4 06/04/2022   No results found for: "25OHVITD2", "25OHVITD3", "VD25OH"   Review of Systems  HENT:  Negative for congestion, sinus pressure and sneezing.   Respiratory:  Negative for cough and shortness of breath.   Cardiovascular:  Negative for chest pain, palpitations and leg swelling.  Gastrointestinal:  Negative for abdominal distention and abdominal pain.  Musculoskeletal:  Negative for neck pain.    Patient Active Problem List   Diagnosis Date Noted   DVT (deep venous thrombosis) (HCC) 01/09/2022   Mixed hyperlipidemia 06/07/2019   Irreducible right femoral hernia 04/28/2018   Limited mobility with chronic pain & MS 04/05/2018    Tobacco abuse 04/05/2018   MS (multiple sclerosis) (HCC)    Hypokalemia 11/25/2017   Seasonal allergic rhinitis due to pollen 12/08/2016   CAD (coronary artery disease) 11/27/2014   Medicare annual wellness visit, subsequent 11/27/2014   Vitamin D deficiency 11/22/2014   Familial multiple lipoprotein-type hyperlipidemia 09/29/2014   Clinical depression 09/29/2014   Acid reflux 09/29/2014   Routine general medical examination at a health care facility 09/29/2014   Restless leg 09/29/2014   DS (disseminated sclerosis) (HCC) 09/29/2014   Nicotine addiction 09/29/2014   OP (osteoporosis) 09/29/2014   Adult hypothyroidism 10/05/2013   Collagenous colitis 10/05/2013   Hypothyroid 10/05/2013    Allergies  Allergen Reactions   Codeine Nausea And Vomiting    Past Surgical History:  Procedure Laterality Date   APPENDECTOMY Left 04/14/2018   Procedure: APPENDECTOMY;  Surgeon: Henrene Dodge, MD;  Location: [facility];  Service: General;  Laterality: Left;   CATARACT EXTRACTION W/PHACO Right 10/30/2014   Procedure: CATARACT EXTRACTION PHACO AND INTRAOCULAR LENS PLACEMENT (IOC);  Surgeon: Sallee Lange, MD;  Location: [facility];  Service: Ophthalmology;  Laterality: Right;  [place]  1:55.7    CATARACT EXTRACTION W/PHACO Left 06/18/2015   Procedure: CATARACT EXTRACTION PHACO AND INTRAOCULAR LENS PLACEMENT (IOC);  Surgeon: Sallee Lange, MD;  Location: [facility];  Service: Ophthalmology;  Laterality: Left;  [place] 01:36 AP% 26.1 CDE 42.19 fluid pack lo0t #  [id number]   COLONOSCOPY  2014   normal- Rein- repeat in 5 years   COLONOSCOPY WITH PROPOFOL N/A 07/09/2020   Procedure: COLONOSCOPY WITH PROPOFOL;  Surgeon: Toledo, Boykin Nearing, MD;  Location: [facility];  Service: Gastroenterology;  Laterality: N/A;   EYE SURGERY     FOOT SURGERY     INGUINAL HERNIA REPAIR Right 04/14/2018   Procedure: HERNIA REPAIR INGUINAL;  Surgeon: Henrene Dodge, MD;  Location: [facility];  Service: General;  Laterality:  Right;   NASAL SINUS SURGERY     PERIPHERAL VASCULAR THROMBECTOMY Right 01/10/2022   Procedure: PERIPHERAL VASCULAR THROMBECTOMY;  Surgeon: Annice Needy, MD;  Location: [facility];  Service: Cardiovascular;  Laterality: Right;   TONSILLECTOMY     TUBAL LIGATION     VAGINAL HYSTERECTOMY      Social History   Tobacco Use   Smoking status: Every Day    Current packs/day: 0.75    Average packs/day: 0.8 packs/day for 55.0 years (41.3 ttl pk-yrs)    Types: Cigarettes   Smokeless tobacco: Never  Vaping Use   Vaping status: Never Used  Substance Use Topics   Alcohol use: No    Alcohol/week: 0.0 standard drinks of alcohol   Drug use: No     Medication list has been reviewed and updated.  No outpatient medications have been marked as taking for the 12/23/22 encounter (Office Visit) with Duanne Limerick, MD.       12/23/2022    2:00 PM 12/05/2022   10:54 AM 06/04/2022    9:52 AM 12/02/2021    8:54 AM  GAD 7 : Generalized Anxiety Score  Nervous, Anxious, on Edge 0 0 0 0  Control/stop worrying 0 0 0 0  Worry too much - different things 0 0 0 0  Trouble relaxing 0 0 0 0  Restless 0 0 0 0  Easily annoyed or irritable 0 0 0 0  Afraid - awful might happen 0 0 0 0  Total GAD 7 Score 0 0 0 0  Anxiety Difficulty Not difficult at all Not difficult at all Not difficult at all Not difficult at all       12/23/2022    2:00 PM 12/05/2022   10:54 AM 11/19/2022    9:10 AM  Depression screen PHQ 2/9  Decreased Interest 0 0 0  Down, Depressed, Hopeless 0 0 1  PHQ - 2 Score 0 0 1  Altered sleeping 0 0 0  Tired, decreased energy 0 0 0  Change in appetite 0 0 0  Feeling bad or failure about yourself  0 0 0  Trouble concentrating 0 0 0  Moving slowly or fidgety/restless 0 0 0  Suicidal thoughts 0 0 0  PHQ-9 Score 0 0 1  Difficult doing work/chores Not difficult at all Not difficult at all Not difficult at all    BP Readings from Last 3 Encounters:  12/23/22 138/82  12/05/22 122/62   07/22/22 111/65    Physical Exam HENT:     Right Ear: Tympanic membrane normal.     Left Ear: Tympanic membrane normal.     Nose: No congestion or rhinorrhea.  Cardiovascular:     Rate and Rhythm: Normal rate.     Heart sounds: No murmur heard.    No gallop.  Pulmonary:     Breath sounds: No wheezing, rhonchi or rales.  Abdominal:     Tenderness: There is no abdominal tenderness. There is no guarding.  Wt Readings from Last 3 Encounters:  12/23/22 148 lb (67.1 kg)  12/05/22 148 lb (67.1 kg)  11/19/22 145 lb (65.8 kg)    BP 138/82   Pulse 73   Temp 99 F (37.2 C) (Oral)   Ht 5\' 2"  (1.575 m)   Wt 148 lb (67.1 kg)   LMP  (LMP Unknown) Comment: had hysterectomy  SpO2 98%   BMI 27.07 kg/m   Assessment and Plan: 1. Post-COVID-19 condition Patient is post COVID convalescing as gradually improving except for decreased oral intake of fluids.  There is no fever chills myalgias patient feels significantly better than she did 48 hours ago.  Will continue to monitor the convalescent phase and patient is to return on a as needed basis.  2. Dehydration Patient has been hesitant to drink fluids because it involves her having to get up to go to the bathroom which becomes a difficulty due to her handicap.  We have encouraged her to increase her fluid intake over the course of the next 24 to 48 hours.    Elizabeth Sauer, MD

## 2023-01-19 ENCOUNTER — Other Ambulatory Visit: Payer: Self-pay | Admitting: Family Medicine

## 2023-01-19 DIAGNOSIS — M8000XS Age-related osteoporosis with current pathological fracture, unspecified site, sequela: Secondary | ICD-10-CM

## 2023-01-19 DIAGNOSIS — E876 Hypokalemia: Secondary | ICD-10-CM

## 2023-01-19 DIAGNOSIS — E039 Hypothyroidism, unspecified: Secondary | ICD-10-CM

## 2023-01-23 ENCOUNTER — Encounter (INDEPENDENT_AMBULATORY_CARE_PROVIDER_SITE_OTHER): Payer: Self-pay | Admitting: Vascular Surgery

## 2023-01-23 ENCOUNTER — Ambulatory Visit (INDEPENDENT_AMBULATORY_CARE_PROVIDER_SITE_OTHER): Payer: Medicare HMO | Admitting: Vascular Surgery

## 2023-01-23 VITALS — BP 133/84 | HR 72 | Resp 18 | Ht 62.0 in | Wt 148.0 lb

## 2023-01-23 DIAGNOSIS — E782 Mixed hyperlipidemia: Secondary | ICD-10-CM | POA: Diagnosis not present

## 2023-01-23 DIAGNOSIS — I82421 Acute embolism and thrombosis of right iliac vein: Secondary | ICD-10-CM | POA: Diagnosis not present

## 2023-01-23 DIAGNOSIS — G35 Multiple sclerosis: Secondary | ICD-10-CM | POA: Diagnosis not present

## 2023-01-23 NOTE — Progress Notes (Signed)
MRN : [id number]  Brooke Juarez is a 72 y.o. ([birth year]) female who presents with chief complaint of  Chief Complaint  Patient presents with   Follow-up    f/u in 6 months with no studies -  .  History of Present Illness: Patient returns today in follow up of her leg swelling and postphlebitic symptoms.  She is doing quite well.  She is about a year after DVT thrombectomy of the right lower extremity for extensive DVT.  Really not having any swelling currently.  Has been trying to exercise her legs although her ambulation is quite limited due to her multiple sclerosis.  No new ulceration or infection.  No chest pain or shortness of breath.  Current Outpatient Medications  Medication Sig Dispense Refill   alendronate (FOSAMAX) 70 MG tablet TAKE 1 TABLET BY MOUTH ONCE A WEEK ON AN EMPTY STOMACH WITH  A  FULL  GLASS  OF  WATER 12 tablet 0   Alpha-Lipoic Acid 200 MG TABS Take by mouth.     aspirin EC 81 MG tablet Take 81 mg by mouth daily. Swallow whole.     cholecalciferol (VITAMIN D3) 10 MCG (400 UNIT) TABS tablet Take 400 Units by mouth.     gabapentin (NEURONTIN) 300 MG capsule Take 1 capsule by mouth 4 (four) times daily. Sherryll Burger     levothyroxine (SYNTHROID) 112 MCG tablet Take 1 tablet by mouth once daily 90 tablet 0   methocarbamol (ROBAXIN) 500 MG tablet SMARTSIG:0.5 Tablet(s) By Mouth Every 12 Hours PRN     montelukast (SINGULAIR) 10 MG tablet Take 1 tablet (10 mg total) by mouth at bedtime.     Multiple Vitamins-Minerals (PRESERVISION AREDS 2 PO) Take 2 capsules by mouth daily.     Omega-3 Fatty Acids (FISH OIL) 1000 MG CAPS Take by mouth.     oxyCODONE (OXY IR/ROXICODONE) 5 MG immediate release tablet Take 5 mg by mouth 2 (two) times daily.     oxyCODONE-acetaminophen (PERCOCET/ROXICET) 5-325 MG tablet      pantoprazole (PROTONIX) 40 MG tablet Take 1 tablet (40 mg total) by mouth daily.     potassium chloride (KLOR-CON) 10 MEQ tablet TAKE 1 TABLET BY MOUTH THREE TIMES A WEEK 36  tablet 0   sulfaSALAzine (AZULFIDINE) 500 MG EC tablet Take 500 mg by mouth 2 (two) times daily.     traZODone (DESYREL) 100 MG tablet Take 1 tablet (100 mg total) by mouth daily.     vitamin B-12 (CYANOCOBALAMIN) 500 MCG tablet Take 500 mcg by mouth every 14 (fourteen) days.      apixaban (ELIQUIS) 5 MG TABS tablet Take 1 tablet (5 mg total) by mouth 2 (two) times daily. (Patient not taking: Reported on 11/19/2022) 60 tablet 6   dronabinol (MARINOL) 2.5 MG capsule Take 2.5 mg by mouth daily as needed. (Patient not taking: Reported on 11/19/2022)     rOPINIRole (REQUIP) 1 MG tablet Take 2 tablets by mouth at bedtime.     No current facility-administered medications for this visit.    Past Medical History:  Diagnosis Date   Arthritis    Crohn disease (HCC)    Depression    GERD (gastroesophageal reflux disease)    Headache    Migraines   Heart murmur    Hyperlipidemia    Hypothyroidism    Macular degeneration    MS (multiple sclerosis) (HCC)    Restless leg    Seizures (HCC)    Tremors of nervous system  Past Surgical History:  Procedure Laterality Date   APPENDECTOMY Left 04/14/2018   Procedure: APPENDECTOMY;  Surgeon: Henrene Dodge, MD;  Location: [facility];  Service: General;  Laterality: Left;   CATARACT EXTRACTION W/PHACO Right 10/30/2014   Procedure: CATARACT EXTRACTION PHACO AND INTRAOCULAR LENS PLACEMENT (IOC);  Surgeon: Sallee Lange, MD;  Location: [facility];  Service: Ophthalmology;  Laterality: Right;  [place]  1:55.7    CATARACT EXTRACTION W/PHACO Left 06/18/2015   Procedure: CATARACT EXTRACTION PHACO AND INTRAOCULAR LENS PLACEMENT (IOC);  Surgeon: Sallee Lange, MD;  Location: [facility];  Service: Ophthalmology;  Laterality: Left;  [place] 01:36 AP% 26.1 CDE 42.19 fluid pack lo0t # [id number] H   COLONOSCOPY  2014   normal- Rein- repeat in 5 years   COLONOSCOPY WITH PROPOFOL N/A 07/09/2020   Procedure: COLONOSCOPY WITH PROPOFOL;  Surgeon: Toledo, Boykin Nearing, MD;  Location:  [facility];  Service: Gastroenterology;  Laterality: N/A;   EYE SURGERY     FOOT SURGERY     INGUINAL HERNIA REPAIR Right 04/14/2018   Procedure: HERNIA REPAIR INGUINAL;  Surgeon: Henrene Dodge, MD;  Location: [facility];  Service: General;  Laterality: Right;   NASAL SINUS SURGERY     PERIPHERAL VASCULAR THROMBECTOMY Right 01/10/2022   Procedure: PERIPHERAL VASCULAR THROMBECTOMY;  Surgeon: Annice Needy, MD;  Location: [facility];  Service: Cardiovascular;  Laterality: Right;   TONSILLECTOMY     TUBAL LIGATION     VAGINAL HYSTERECTOMY       Social History   Tobacco Use   Smoking status: Every Day    Current packs/day: 0.75    Average packs/day: 0.8 packs/day for 55.0 years (41.3 ttl pk-yrs)    Types: Cigarettes   Smokeless tobacco: Never  Vaping Use   Vaping status: Never Used  Substance Use Topics   Alcohol use: No    Alcohol/week: 0.0 standard drinks of alcohol   Drug use: No       Family History  Problem Relation Age of Onset   Heart disease Mother    Stroke Mother    Cancer Mother        colon   Mitral valve prolapse Mother    Stroke Father    Breast cancer Neg Hx      Allergies  Allergen Reactions   Codeine Nausea And Vomiting     REVIEW OF SYSTEMS (Negative unless checked)   Constitutional: [] Weight loss  [] Fever  [] Chills Cardiac: [] Chest pain   [] Chest pressure   [] Palpitations   [] Shortness of breath when laying flat   [] Shortness of breath at rest   [] Shortness of breath with exertion. Vascular:  [] Pain in legs with walking   [] Pain in legs at rest   [] Pain in legs when laying flat   [] Claudication   [] Pain in feet when walking  [] Pain in feet at rest  [] Pain in feet when laying flat   [x] History of DVT   [x] Phlebitis   [x] Swelling in legs   [] Varicose veins   [] Non-healing ulcers Pulmonary:   [] Uses home oxygen   [] Productive cough   [] Hemoptysis   [] Wheeze  [] COPD   [] Asthma Neurologic:  [] Dizziness  [] Blackouts   [x] Seizures   [] History  of stroke   [] History of TIA  [] Aphasia   [] Temporary blindness   [] Dysphagia   [x] Weakness or numbness in arms   [x] Weakness or numbness in legs Musculoskeletal:  [x] Arthritis   [] Joint swelling   [] Joint pain   [] Low back pain Hematologic:  [] Easy bruising  []   Easy bleeding   [] Hypercoagulable state   [] Anemic   Gastrointestinal:  [] Blood in stool   [] Vomiting blood  [x] Gastroesophageal reflux/heartburn   [] Abdominal pain Genitourinary:  [] Chronic kidney disease   [] Difficult urination  [] Frequent urination  [] Burning with urination   [] Hematuria Skin:  [] Rashes   [] Ulcers   [] Wounds Psychological:  [] History of anxiety   [x]  History of major depression.  Physical Examination  BP 133/84 (BP Location: Left Arm)   Pulse 72   Resp 18   Ht 5\' 2"  (1.575 m)   Wt 148 lb (67.1 kg)   LMP  (LMP Unknown) Comment: had hysterectomy  BMI 27.07 kg/m  Gen:  WD/WN, NAD Head: [place]/AT, No temporalis wasting. Ear/Nose/Throat: Hearing grossly intact, nares w/o erythema or drainage Eyes: Conjunctiva clear. Sclera non-icteric Neck: Supple.  Trachea midline Pulmonary:  Good air movement, no use of accessory muscles.  Cardiac: RRR, no JVD Vascular:  Vessel Right Left  Radial Palpable Palpable           Musculoskeletal: In a wheelchair.  No deformity or atrophy.  No appreciable edema this morning. Neurologic: Sensation grossly intact in extremities.  Symmetrical.  Speech is fluent.  Psychiatric: Judgment intact, Mood & affect appropriate for pt's clinical situation. Dermatologic: No rashes or ulcers noted.  No cellulitis or open wounds.      Labs Recent Results (from the past 2160 hour(s))  TSH     Status: None   Collection Time: 12/16/22  8:24 AM  Result Value Ref Range   TSH 1.130 0.450 - 4.500 uIU/mL  Renal Function Panel     Status: Abnormal   Collection Time: 12/16/22  8:24 AM  Result Value Ref Range   Glucose 96 70 - 99 mg/dL   BUN 8 8 - 27 mg/dL   Creatinine, Ser 4.40 0.57 - 1.00 mg/dL    eGFR 82 >34 VQ/QVZ/5.63   BUN/Creatinine Ratio 11 (L) 12 - 28   Sodium 142 134 - 144 mmol/L   Potassium 5.1 3.5 - 5.2 mmol/L   Chloride 104 96 - 106 mmol/L   CO2 23 20 - 29 mmol/L   Calcium 9.5 8.7 - 10.3 mg/dL   Phosphorus 3.7 3.0 - 4.3 mg/dL   Albumin 4.3 3.8 - 4.8 g/dL    Radiology No results found.  Assessment/Plan MS (multiple sclerosis) (HCC) Limits her mobility significantly which may worsen leg swelling.   Mixed hyperlipidemia lipid control important in reducing the progression of atherosclerotic disease  DVT (deep venous thrombosis) (HCC) Doing well after DVT thrombectomy.  Not really having any significant postphlebitic symptoms currently.  Has been on aspirin for the past 6 months with no recurrence.  Will continue aspirin therapy daily.  Follow-up in 1 year.    Festus Barren, MD  01/23/2023 11:43 AM    This note was created with Dragon medical transcription system.  Any errors from dictation are purely unintentional

## 2023-01-23 NOTE — Assessment & Plan Note (Signed)
Doing well after DVT thrombectomy.  Not really having any significant postphlebitic symptoms currently.  Has been on aspirin for the past 6 months with no recurrence.  Will continue aspirin therapy daily.  Follow-up in 1 year.

## 2023-01-27 DIAGNOSIS — K52831 Collagenous colitis: Secondary | ICD-10-CM | POA: Diagnosis not present

## 2023-01-27 DIAGNOSIS — K219 Gastro-esophageal reflux disease without esophagitis: Secondary | ICD-10-CM | POA: Diagnosis not present

## 2023-01-27 DIAGNOSIS — R7989 Other specified abnormal findings of blood chemistry: Secondary | ICD-10-CM | POA: Diagnosis not present

## 2023-01-28 ENCOUNTER — Other Ambulatory Visit: Payer: Self-pay | Admitting: Gastroenterology

## 2023-01-28 DIAGNOSIS — R7989 Other specified abnormal findings of blood chemistry: Secondary | ICD-10-CM

## 2023-01-29 DIAGNOSIS — R7989 Other specified abnormal findings of blood chemistry: Secondary | ICD-10-CM | POA: Diagnosis not present

## 2023-01-29 DIAGNOSIS — K219 Gastro-esophageal reflux disease without esophagitis: Secondary | ICD-10-CM | POA: Diagnosis not present

## 2023-01-29 DIAGNOSIS — K52831 Collagenous colitis: Secondary | ICD-10-CM | POA: Diagnosis not present

## 2023-01-29 DIAGNOSIS — Z1159 Encounter for screening for other viral diseases: Secondary | ICD-10-CM | POA: Diagnosis not present

## 2023-02-03 ENCOUNTER — Ambulatory Visit
Admission: RE | Admit: 2023-02-03 | Discharge: 2023-02-03 | Disposition: A | Discharge status: Home / Self Care | Payer: Medicare HMO | Source: Ambulatory Visit | Attending: Gastroenterology

## 2023-02-03 DIAGNOSIS — R7989 Other specified abnormal findings of blood chemistry: Secondary | ICD-10-CM

## 2023-02-03 DIAGNOSIS — R945 Abnormal results of liver function studies: Secondary | ICD-10-CM | POA: Diagnosis not present

## 2023-02-09 DIAGNOSIS — H353231 Exudative age-related macular degeneration, bilateral, with active choroidal neovascularization: Secondary | ICD-10-CM | POA: Diagnosis not present

## 2023-02-09 DIAGNOSIS — H353221 Exudative age-related macular degeneration, left eye, with active choroidal neovascularization: Secondary | ICD-10-CM | POA: Diagnosis not present

## 2023-02-09 DIAGNOSIS — H353211 Exudative age-related macular degeneration, right eye, with active choroidal neovascularization: Secondary | ICD-10-CM | POA: Diagnosis not present

## 2023-02-22 ENCOUNTER — Other Ambulatory Visit: Payer: Self-pay | Admitting: Family Medicine

## 2023-02-22 DIAGNOSIS — J301 Allergic rhinitis due to pollen: Secondary | ICD-10-CM

## 2023-02-23 DIAGNOSIS — M1990 Unspecified osteoarthritis, unspecified site: Secondary | ICD-10-CM | POA: Diagnosis not present

## 2023-02-23 DIAGNOSIS — M542 Cervicalgia: Secondary | ICD-10-CM | POA: Diagnosis not present

## 2023-02-23 DIAGNOSIS — Z79891 Long term (current) use of opiate analgesic: Secondary | ICD-10-CM | POA: Diagnosis not present

## 2023-02-23 DIAGNOSIS — G4089 Other seizures: Secondary | ICD-10-CM | POA: Diagnosis not present

## 2023-02-23 DIAGNOSIS — G35 Multiple sclerosis: Secondary | ICD-10-CM | POA: Diagnosis not present

## 2023-02-23 DIAGNOSIS — F1120 Opioid dependence, uncomplicated: Secondary | ICD-10-CM | POA: Diagnosis not present

## 2023-02-23 DIAGNOSIS — G894 Chronic pain syndrome: Secondary | ICD-10-CM | POA: Diagnosis not present

## 2023-02-23 DIAGNOSIS — M5440 Lumbago with sciatica, unspecified side: Secondary | ICD-10-CM | POA: Diagnosis not present

## 2023-02-23 DIAGNOSIS — M62838 Other muscle spasm: Secondary | ICD-10-CM | POA: Diagnosis not present

## 2023-02-23 DIAGNOSIS — G2581 Restless legs syndrome: Secondary | ICD-10-CM | POA: Diagnosis not present

## 2023-02-23 DIAGNOSIS — M791 Myalgia, unspecified site: Secondary | ICD-10-CM | POA: Diagnosis not present

## 2023-04-19 ENCOUNTER — Other Ambulatory Visit: Payer: Self-pay | Admitting: Family Medicine

## 2023-04-19 DIAGNOSIS — M8000XS Age-related osteoporosis with current pathological fracture, unspecified site, sequela: Secondary | ICD-10-CM

## 2023-04-19 DIAGNOSIS — E876 Hypokalemia: Secondary | ICD-10-CM

## 2023-04-19 DIAGNOSIS — E039 Hypothyroidism, unspecified: Secondary | ICD-10-CM

## 2023-04-20 DIAGNOSIS — M542 Cervicalgia: Secondary | ICD-10-CM | POA: Diagnosis not present

## 2023-04-20 DIAGNOSIS — M1990 Unspecified osteoarthritis, unspecified site: Secondary | ICD-10-CM | POA: Diagnosis not present

## 2023-04-20 DIAGNOSIS — G2581 Restless legs syndrome: Secondary | ICD-10-CM | POA: Diagnosis not present

## 2023-04-20 DIAGNOSIS — M5440 Lumbago with sciatica, unspecified side: Secondary | ICD-10-CM | POA: Diagnosis not present

## 2023-04-20 DIAGNOSIS — G894 Chronic pain syndrome: Secondary | ICD-10-CM | POA: Diagnosis not present

## 2023-04-20 DIAGNOSIS — G35 Multiple sclerosis: Secondary | ICD-10-CM | POA: Diagnosis not present

## 2023-04-20 DIAGNOSIS — M791 Myalgia, unspecified site: Secondary | ICD-10-CM | POA: Diagnosis not present

## 2023-04-20 DIAGNOSIS — F1120 Opioid dependence, uncomplicated: Secondary | ICD-10-CM | POA: Diagnosis not present

## 2023-04-20 DIAGNOSIS — G4089 Other seizures: Secondary | ICD-10-CM | POA: Diagnosis not present

## 2023-04-20 DIAGNOSIS — M62838 Other muscle spasm: Secondary | ICD-10-CM | POA: Diagnosis not present

## 2023-04-20 DIAGNOSIS — Z79891 Long term (current) use of opiate analgesic: Secondary | ICD-10-CM | POA: Diagnosis not present

## 2023-04-27 DIAGNOSIS — H353231 Exudative age-related macular degeneration, bilateral, with active choroidal neovascularization: Secondary | ICD-10-CM | POA: Diagnosis not present

## 2023-04-27 DIAGNOSIS — H353211 Exudative age-related macular degeneration, right eye, with active choroidal neovascularization: Secondary | ICD-10-CM | POA: Diagnosis not present

## 2023-04-27 DIAGNOSIS — H353221 Exudative age-related macular degeneration, left eye, with active choroidal neovascularization: Secondary | ICD-10-CM | POA: Diagnosis not present

## 2023-05-24 ENCOUNTER — Other Ambulatory Visit: Payer: Self-pay | Admitting: Family Medicine

## 2023-05-24 DIAGNOSIS — J301 Allergic rhinitis due to pollen: Secondary | ICD-10-CM

## 2023-05-28 ENCOUNTER — Ambulatory Visit: Payer: Medicare HMO | Admitting: Family Medicine

## 2023-05-29 ENCOUNTER — Ambulatory Visit: Payer: Medicare HMO | Admitting: Family Medicine

## 2023-06-05 ENCOUNTER — Ambulatory Visit: Payer: Medicare HMO | Admitting: Family Medicine

## 2023-06-24 DIAGNOSIS — G894 Chronic pain syndrome: Secondary | ICD-10-CM | POA: Diagnosis not present

## 2023-06-24 DIAGNOSIS — Z716 Tobacco abuse counseling: Secondary | ICD-10-CM | POA: Diagnosis not present

## 2023-06-24 DIAGNOSIS — Z720 Tobacco use: Secondary | ICD-10-CM | POA: Diagnosis not present

## 2023-06-24 DIAGNOSIS — K219 Gastro-esophageal reflux disease without esophagitis: Secondary | ICD-10-CM | POA: Diagnosis not present

## 2023-06-24 DIAGNOSIS — M5440 Lumbago with sciatica, unspecified side: Secondary | ICD-10-CM | POA: Diagnosis not present

## 2023-06-24 DIAGNOSIS — M1990 Unspecified osteoarthritis, unspecified site: Secondary | ICD-10-CM | POA: Diagnosis not present

## 2023-06-24 DIAGNOSIS — G2581 Restless legs syndrome: Secondary | ICD-10-CM | POA: Diagnosis not present

## 2023-06-24 DIAGNOSIS — M62838 Other muscle spasm: Secondary | ICD-10-CM | POA: Diagnosis not present

## 2023-06-24 DIAGNOSIS — G35 Multiple sclerosis: Secondary | ICD-10-CM | POA: Diagnosis not present

## 2023-06-24 DIAGNOSIS — M791 Myalgia, unspecified site: Secondary | ICD-10-CM | POA: Diagnosis not present

## 2023-06-24 DIAGNOSIS — F1120 Opioid dependence, uncomplicated: Secondary | ICD-10-CM | POA: Diagnosis not present

## 2023-06-24 DIAGNOSIS — G4089 Other seizures: Secondary | ICD-10-CM | POA: Diagnosis not present

## 2023-06-24 DIAGNOSIS — Z79891 Long term (current) use of opiate analgesic: Secondary | ICD-10-CM | POA: Diagnosis not present

## 2023-06-24 DIAGNOSIS — M542 Cervicalgia: Secondary | ICD-10-CM | POA: Diagnosis not present

## 2023-07-14 ENCOUNTER — Other Ambulatory Visit: Payer: Self-pay | Admitting: Family Medicine

## 2023-07-14 DIAGNOSIS — E039 Hypothyroidism, unspecified: Secondary | ICD-10-CM

## 2023-07-14 DIAGNOSIS — E876 Hypokalemia: Secondary | ICD-10-CM

## 2023-07-14 DIAGNOSIS — M8000XS Age-related osteoporosis with current pathological fracture, unspecified site, sequela: Secondary | ICD-10-CM

## 2023-07-21 DIAGNOSIS — H353211 Exudative age-related macular degeneration, right eye, with active choroidal neovascularization: Secondary | ICD-10-CM | POA: Diagnosis not present

## 2023-07-21 DIAGNOSIS — H353221 Exudative age-related macular degeneration, left eye, with active choroidal neovascularization: Secondary | ICD-10-CM | POA: Diagnosis not present

## 2023-08-19 ENCOUNTER — Other Ambulatory Visit: Payer: Self-pay | Admitting: Family Medicine

## 2023-08-19 DIAGNOSIS — J301 Allergic rhinitis due to pollen: Secondary | ICD-10-CM

## 2023-08-26 DIAGNOSIS — K219 Gastro-esophageal reflux disease without esophagitis: Secondary | ICD-10-CM | POA: Diagnosis not present

## 2023-08-26 DIAGNOSIS — M62838 Other muscle spasm: Secondary | ICD-10-CM | POA: Diagnosis not present

## 2023-08-26 DIAGNOSIS — M791 Myalgia, unspecified site: Secondary | ICD-10-CM | POA: Diagnosis not present

## 2023-08-26 DIAGNOSIS — M5440 Lumbago with sciatica, unspecified side: Secondary | ICD-10-CM | POA: Diagnosis not present

## 2023-08-26 DIAGNOSIS — Z79891 Long term (current) use of opiate analgesic: Secondary | ICD-10-CM | POA: Diagnosis not present

## 2023-08-26 DIAGNOSIS — G35 Multiple sclerosis: Secondary | ICD-10-CM | POA: Diagnosis not present

## 2023-08-26 DIAGNOSIS — Z720 Tobacco use: Secondary | ICD-10-CM | POA: Diagnosis not present

## 2023-08-26 DIAGNOSIS — F1120 Opioid dependence, uncomplicated: Secondary | ICD-10-CM | POA: Diagnosis not present

## 2023-08-26 DIAGNOSIS — G894 Chronic pain syndrome: Secondary | ICD-10-CM | POA: Diagnosis not present

## 2023-08-26 DIAGNOSIS — M1990 Unspecified osteoarthritis, unspecified site: Secondary | ICD-10-CM | POA: Diagnosis not present

## 2023-08-26 DIAGNOSIS — M542 Cervicalgia: Secondary | ICD-10-CM | POA: Diagnosis not present

## 2023-08-26 DIAGNOSIS — G2581 Restless legs syndrome: Secondary | ICD-10-CM | POA: Diagnosis not present

## 2023-08-26 DIAGNOSIS — G4089 Other seizures: Secondary | ICD-10-CM | POA: Diagnosis not present

## 2023-08-26 DIAGNOSIS — Z716 Tobacco abuse counseling: Secondary | ICD-10-CM | POA: Diagnosis not present

## 2023-09-25 ENCOUNTER — Ambulatory Visit: Payer: Self-pay | Admitting: Family Medicine

## 2023-09-25 DIAGNOSIS — K52831 Collagenous colitis: Secondary | ICD-10-CM | POA: Diagnosis not present

## 2023-09-25 DIAGNOSIS — Z1321 Encounter for screening for nutritional disorder: Secondary | ICD-10-CM | POA: Diagnosis not present

## 2023-09-25 DIAGNOSIS — R030 Elevated blood-pressure reading, without diagnosis of hypertension: Secondary | ICD-10-CM | POA: Diagnosis not present

## 2023-09-25 DIAGNOSIS — Z720 Tobacco use: Secondary | ICD-10-CM | POA: Diagnosis not present

## 2023-09-25 DIAGNOSIS — E039 Hypothyroidism, unspecified: Secondary | ICD-10-CM | POA: Diagnosis not present

## 2023-09-25 DIAGNOSIS — F329 Major depressive disorder, single episode, unspecified: Secondary | ICD-10-CM | POA: Diagnosis not present

## 2023-09-25 DIAGNOSIS — Z13220 Encounter for screening for lipoid disorders: Secondary | ICD-10-CM | POA: Diagnosis not present

## 2023-10-06 ENCOUNTER — Encounter (INDEPENDENT_AMBULATORY_CARE_PROVIDER_SITE_OTHER): Payer: Self-pay

## 2023-10-06 DIAGNOSIS — H353211 Exudative age-related macular degeneration, right eye, with active choroidal neovascularization: Secondary | ICD-10-CM | POA: Diagnosis not present

## 2023-10-06 DIAGNOSIS — Z961 Presence of intraocular lens: Secondary | ICD-10-CM | POA: Diagnosis not present

## 2023-10-06 DIAGNOSIS — H353221 Exudative age-related macular degeneration, left eye, with active choroidal neovascularization: Secondary | ICD-10-CM | POA: Diagnosis not present

## 2023-10-08 ENCOUNTER — Other Ambulatory Visit: Payer: Self-pay | Admitting: Family Medicine

## 2023-10-08 DIAGNOSIS — M8000XS Age-related osteoporosis with current pathological fracture, unspecified site, sequela: Secondary | ICD-10-CM

## 2023-10-14 ENCOUNTER — Other Ambulatory Visit: Payer: Self-pay | Admitting: Family Medicine

## 2023-10-14 DIAGNOSIS — M8000XS Age-related osteoporosis with current pathological fracture, unspecified site, sequela: Secondary | ICD-10-CM

## 2023-10-15 DIAGNOSIS — M25511 Pain in right shoulder: Secondary | ICD-10-CM | POA: Diagnosis not present

## 2023-10-15 DIAGNOSIS — R2990 Unspecified symptoms and signs involving the nervous system: Secondary | ICD-10-CM | POA: Diagnosis not present

## 2023-10-15 DIAGNOSIS — R2689 Other abnormalities of gait and mobility: Secondary | ICD-10-CM | POA: Diagnosis not present

## 2023-10-15 DIAGNOSIS — G2581 Restless legs syndrome: Secondary | ICD-10-CM | POA: Diagnosis not present

## 2023-10-15 DIAGNOSIS — R404 Transient alteration of awareness: Secondary | ICD-10-CM | POA: Diagnosis not present

## 2023-10-15 DIAGNOSIS — Z1331 Encounter for screening for depression: Secondary | ICD-10-CM | POA: Diagnosis not present

## 2023-10-19 ENCOUNTER — Other Ambulatory Visit: Payer: Self-pay | Admitting: Family Medicine

## 2023-10-19 DIAGNOSIS — Z79891 Long term (current) use of opiate analgesic: Secondary | ICD-10-CM | POA: Diagnosis not present

## 2023-10-19 DIAGNOSIS — G4089 Other seizures: Secondary | ICD-10-CM | POA: Diagnosis not present

## 2023-10-19 DIAGNOSIS — F1120 Opioid dependence, uncomplicated: Secondary | ICD-10-CM | POA: Diagnosis not present

## 2023-10-19 DIAGNOSIS — Z716 Tobacco abuse counseling: Secondary | ICD-10-CM | POA: Diagnosis not present

## 2023-10-19 DIAGNOSIS — M5440 Lumbago with sciatica, unspecified side: Secondary | ICD-10-CM | POA: Diagnosis not present

## 2023-10-19 DIAGNOSIS — Z720 Tobacco use: Secondary | ICD-10-CM | POA: Diagnosis not present

## 2023-10-19 DIAGNOSIS — G2581 Restless legs syndrome: Secondary | ICD-10-CM | POA: Diagnosis not present

## 2023-10-19 DIAGNOSIS — K219 Gastro-esophageal reflux disease without esophagitis: Secondary | ICD-10-CM | POA: Diagnosis not present

## 2023-10-19 DIAGNOSIS — M8000XS Age-related osteoporosis with current pathological fracture, unspecified site, sequela: Secondary | ICD-10-CM

## 2023-10-19 DIAGNOSIS — G6289 Other specified polyneuropathies: Secondary | ICD-10-CM | POA: Diagnosis not present

## 2023-10-19 DIAGNOSIS — M545 Low back pain, unspecified: Secondary | ICD-10-CM | POA: Diagnosis not present

## 2023-10-19 DIAGNOSIS — G894 Chronic pain syndrome: Secondary | ICD-10-CM | POA: Diagnosis not present

## 2023-10-19 DIAGNOSIS — M1990 Unspecified osteoarthritis, unspecified site: Secondary | ICD-10-CM | POA: Diagnosis not present

## 2023-10-19 DIAGNOSIS — G35 Multiple sclerosis: Secondary | ICD-10-CM | POA: Diagnosis not present

## 2023-10-19 DIAGNOSIS — M542 Cervicalgia: Secondary | ICD-10-CM | POA: Diagnosis not present

## 2023-11-06 DIAGNOSIS — M19011 Primary osteoarthritis, right shoulder: Secondary | ICD-10-CM | POA: Diagnosis not present

## 2023-11-06 DIAGNOSIS — M12811 Other specific arthropathies, not elsewhere classified, right shoulder: Secondary | ICD-10-CM | POA: Diagnosis not present

## 2023-11-06 DIAGNOSIS — M25511 Pain in right shoulder: Secondary | ICD-10-CM | POA: Diagnosis not present

## 2023-12-05 IMAGING — MR MR CERVICAL SPINE W/O CM
5 series · 39 of 48 positions shown · non-contrast
Comparison: None Available.

CLINICAL DATA: Restless leg syndrome, Jefry of altered
consciousness

EXAM:
MRI CERVICAL SPINE WITHOUT CONTRAST
TECHNIQUE: Multiplanar, multisequence MR imaging of the cervical spine was
performed. No intravenous contrast was administered.

[Series 24: T2 · sagittal · 3.0mm · 0.62mm/px · 6 of 15 slices shown (1 of 2)]
[im 1/15]
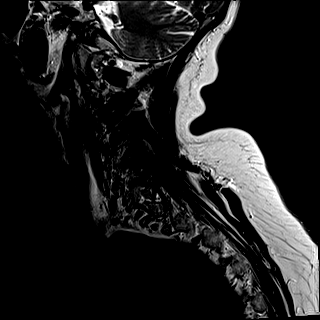
[im 3/15]
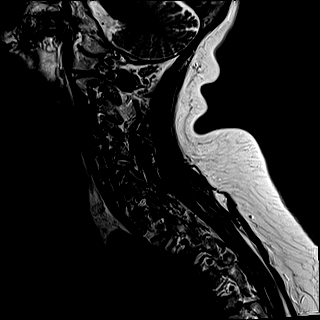
[im 6/15]
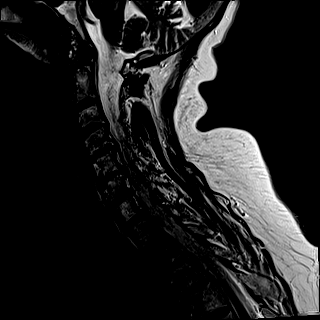
[im 9/15]
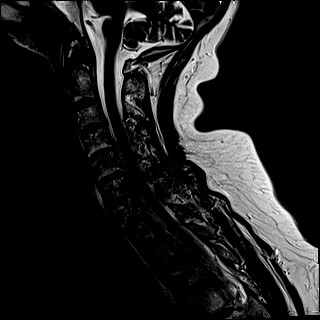
[im 12/15]
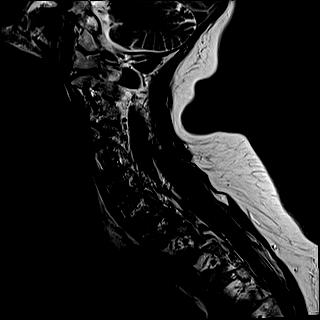
[im 15/15]
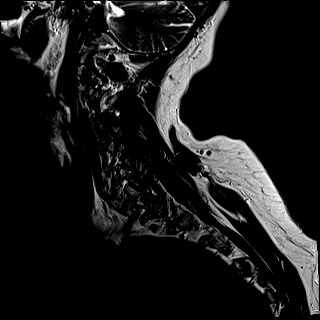

[Series 25: FLAIR · sagittal · 3.0mm · 0.78mm/px · 7 of 15 slices shown]
[im 1/15]
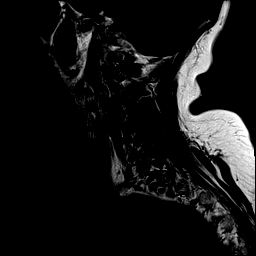
[im 3/15]
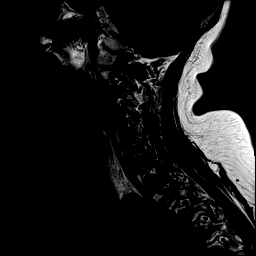
[im 5/15]
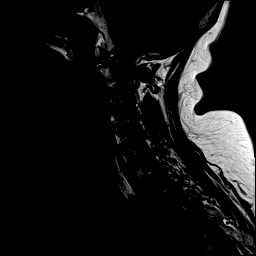
[im 8/15]
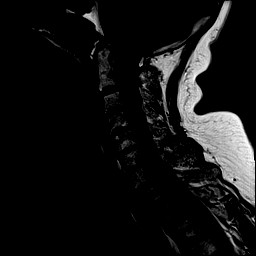
[im 10/15]
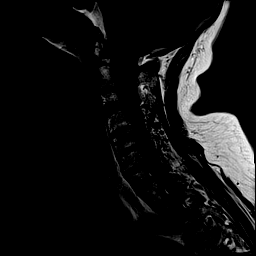
[im 12/15]
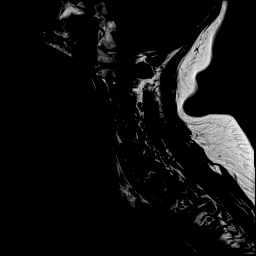
[im 15/15]
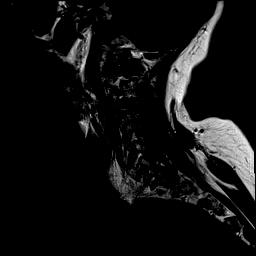

[Series 26: STIR · sagittal · 3.0mm · 0.62mm/px · 7 of 15 slices shown]
[im 1/15]
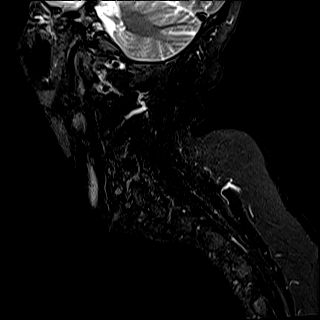
[im 3/15]
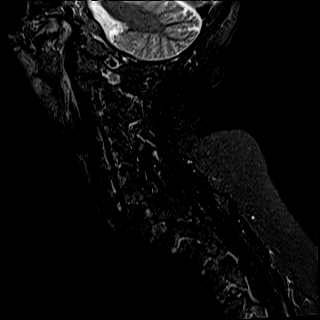
[im 5/15]
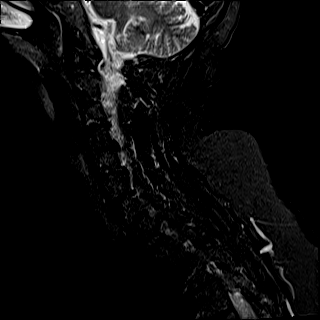
[im 8/15]
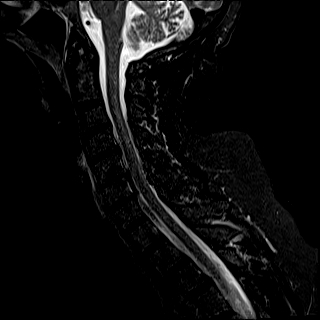
[im 10/15]
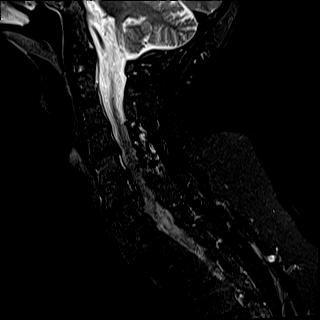
[im 12/15]
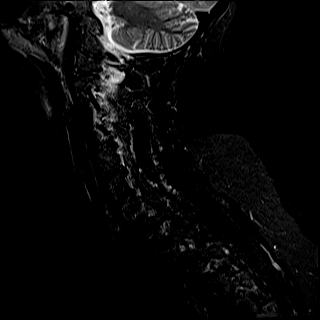
[im 15/15]
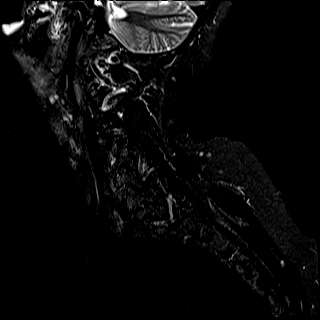

[Series 27: T2 · axial · 3.0mm · 0.70mm/px · z∈[-236,-144]mm · 11 of 29 slices shown (2 of 2)]
[im 1/29]
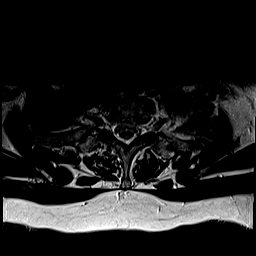
[im 3/29]
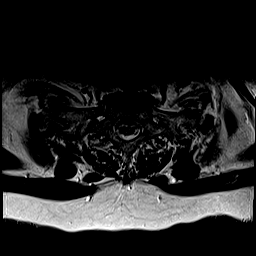
[im 5/29]
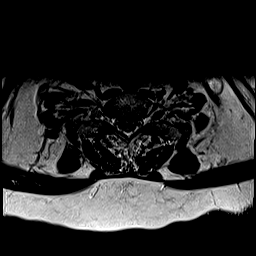
[im 7/29]
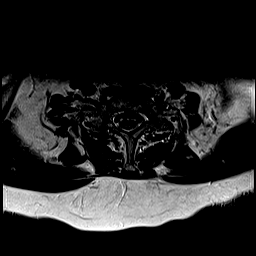
[im 9/29]
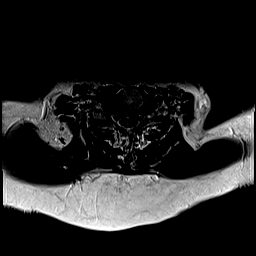
[im 11/29]
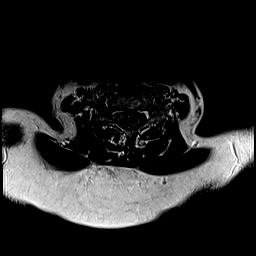
[im 13/29]
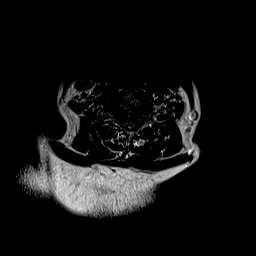
[im 16/29]
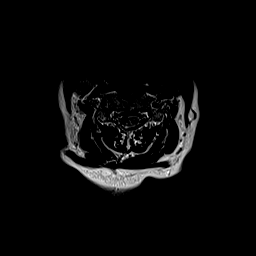
[im 20/29]
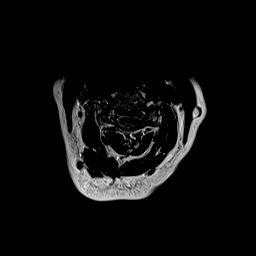
[im 24/29]
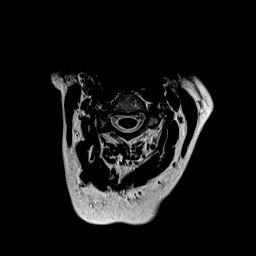
[im 29/29]
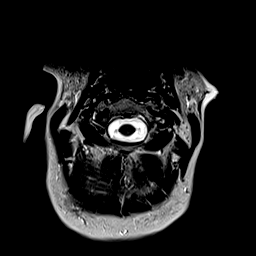

[Series 28: ax mpgr · axial · 3.0mm · 0.35mm/px · z∈[-236,-144]mm · 8 of 29 slices shown]
[im 1/29]
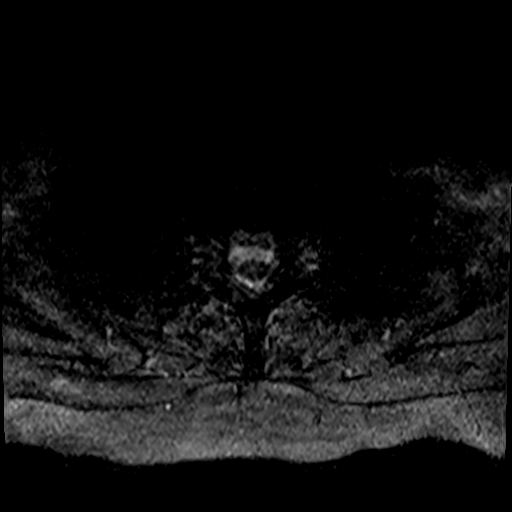
[im 5/29]
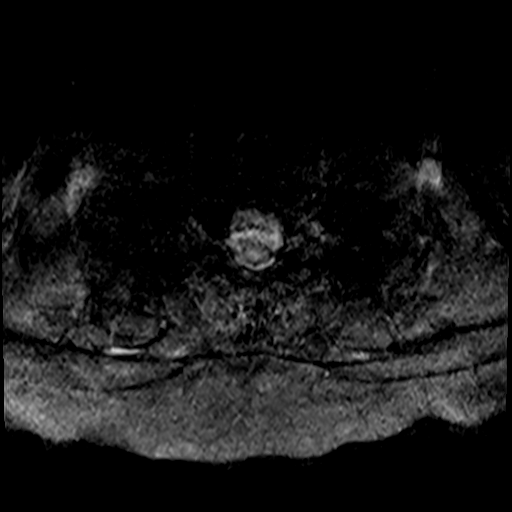
[im 9/29]
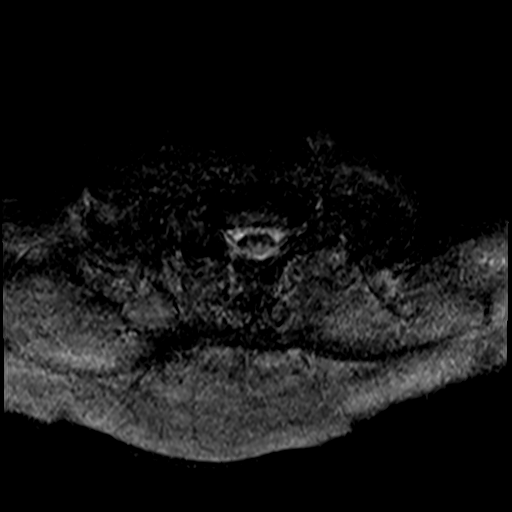
[im 13/29]
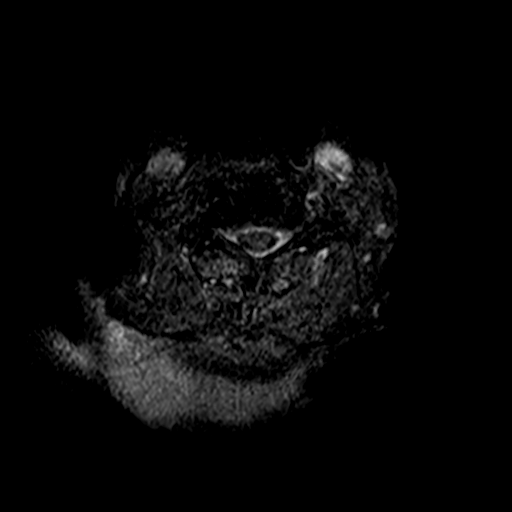
[im 16/29]
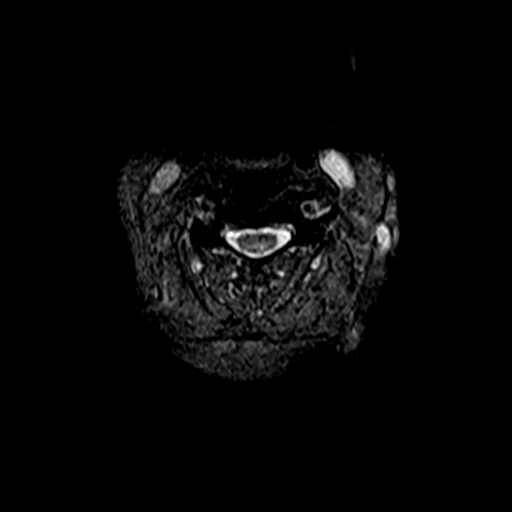
[im 20/29]
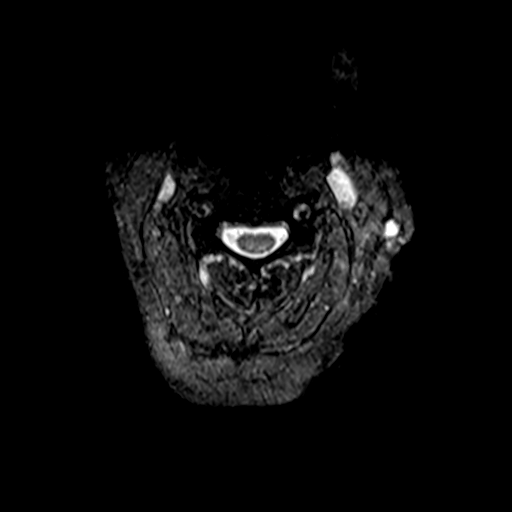
[im 24/29]
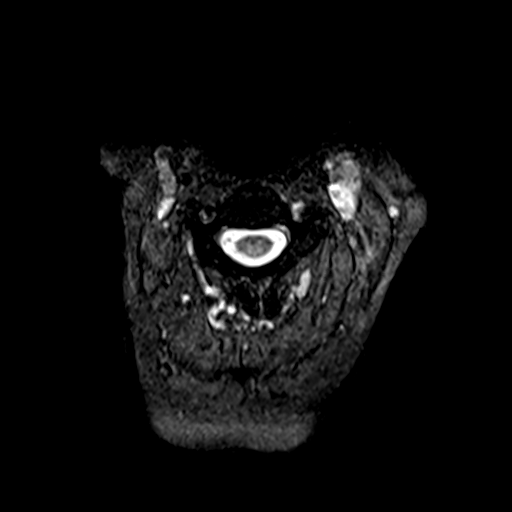
[im 29/29]
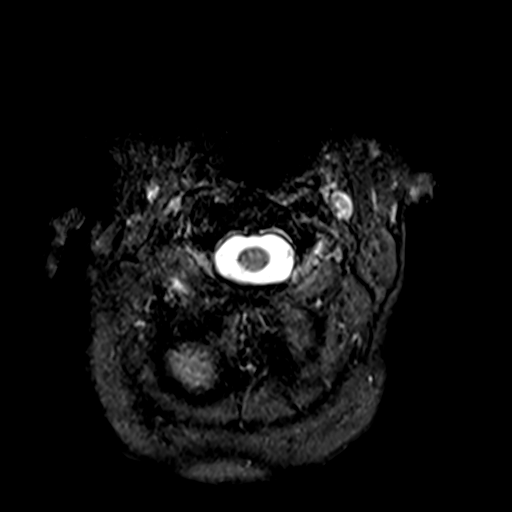

[39 of 48 positions shown; findings below may reference images not displayed]

FINDINGS: Alignment: No significant listhesis.

Vertebrae: Vertebral body heights are maintained. There is
degenerative endplate irregularity. Degenerative plate marrow
changes are present without edema. No suspicious osseous lesion.

Cord: No abnormal signal.

Posterior Fossa, vertebral arteries, paraspinal tissues:
Unremarkable.

Disc levels:

C2-C3:  Facet hypertrophy.  No canal or foraminal stenosis.

C3-C4: Disc bulge. Uncovertebral and facet hypertrophy. No canal or
left foraminal stenosis. Minor right foraminal stenosis.

C4-C5: Disc bulge with endplate osteophytes. Uncovertebral greater
than facet hypertrophy. Mild canal stenosis. Marked right and mild
left foraminal stenosis.

C5-C6: Disc bulge with endplate osteophytes. Uncovertebral greater
than facet hypertrophy. Mild canal stenosis. Marked right and
moderate left foraminal stenosis.

C6-C7: Disc bulge with endplate osteophytes. Uncovertebral greater
than facet hypertrophy. No canal stenosis. Moderate right and mild
to moderate left foraminal stenosis.

C7-T1: Disc bulge eccentric to the right with endplate osteophytes.
No canal or foraminal stenosis.
IMPRESSION: Multilevel degenerative changes as detailed above. Suboptimal
foraminal stenosis evaluation due to motion degradation. No
high-grade canal stenosis. Multilevel right greater than left
foraminal narrowing.

## 2023-12-07 DIAGNOSIS — Z79891 Long term (current) use of opiate analgesic: Secondary | ICD-10-CM | POA: Diagnosis not present

## 2023-12-07 DIAGNOSIS — G894 Chronic pain syndrome: Secondary | ICD-10-CM | POA: Diagnosis not present

## 2023-12-07 DIAGNOSIS — G35 Multiple sclerosis: Secondary | ICD-10-CM | POA: Diagnosis not present

## 2023-12-07 DIAGNOSIS — M545 Low back pain, unspecified: Secondary | ICD-10-CM | POA: Diagnosis not present

## 2023-12-07 DIAGNOSIS — Z720 Tobacco use: Secondary | ICD-10-CM | POA: Diagnosis not present

## 2023-12-07 DIAGNOSIS — F1120 Opioid dependence, uncomplicated: Secondary | ICD-10-CM | POA: Diagnosis not present

## 2023-12-07 DIAGNOSIS — G2581 Restless legs syndrome: Secondary | ICD-10-CM | POA: Diagnosis not present

## 2023-12-07 DIAGNOSIS — Z716 Tobacco abuse counseling: Secondary | ICD-10-CM | POA: Diagnosis not present

## 2023-12-07 DIAGNOSIS — M5440 Lumbago with sciatica, unspecified side: Secondary | ICD-10-CM | POA: Diagnosis not present

## 2023-12-07 DIAGNOSIS — K219 Gastro-esophageal reflux disease without esophagitis: Secondary | ICD-10-CM | POA: Diagnosis not present

## 2023-12-07 DIAGNOSIS — M542 Cervicalgia: Secondary | ICD-10-CM | POA: Diagnosis not present

## 2023-12-07 DIAGNOSIS — G6289 Other specified polyneuropathies: Secondary | ICD-10-CM | POA: Diagnosis not present

## 2023-12-07 DIAGNOSIS — G4089 Other seizures: Secondary | ICD-10-CM | POA: Diagnosis not present

## 2023-12-07 DIAGNOSIS — M1990 Unspecified osteoarthritis, unspecified site: Secondary | ICD-10-CM | POA: Diagnosis not present

## 2023-12-29 DIAGNOSIS — H353221 Exudative age-related macular degeneration, left eye, with active choroidal neovascularization: Secondary | ICD-10-CM | POA: Diagnosis not present

## 2023-12-29 DIAGNOSIS — H353232 Exudative age-related macular degeneration, bilateral, with inactive choroidal neovascularization: Secondary | ICD-10-CM | POA: Diagnosis not present

## 2024-01-26 ENCOUNTER — Encounter (INDEPENDENT_AMBULATORY_CARE_PROVIDER_SITE_OTHER): Payer: Self-pay | Admitting: Vascular Surgery

## 2024-01-26 ENCOUNTER — Ambulatory Visit (INDEPENDENT_AMBULATORY_CARE_PROVIDER_SITE_OTHER): Payer: Medicare HMO | Admitting: Vascular Surgery

## 2024-01-26 VITALS — BP 138/81 | HR 75

## 2024-01-26 DIAGNOSIS — E782 Mixed hyperlipidemia: Secondary | ICD-10-CM | POA: Diagnosis not present

## 2024-01-26 DIAGNOSIS — G35 Multiple sclerosis: Secondary | ICD-10-CM | POA: Diagnosis not present

## 2024-01-26 DIAGNOSIS — I82421 Acute embolism and thrombosis of right iliac vein: Secondary | ICD-10-CM | POA: Diagnosis not present

## 2024-01-26 NOTE — Progress Notes (Signed)
 MRN : [id number]  Brooke Juarez is a 73 y.o. ([birth year]) female who presents with chief complaint of No chief complaint on file. SABRA  History of Present Illness: Patient returns today in follow up of previous DVT.  She had extensive thrombectomy about 2 years ago for extensive right lower extremity DVT.  She is doing well.  She is currently having no significant swelling in her legs.  She has chronic pain in both legs.  She has MS and her mobility is very poor.  It has become much more of a chore for her to go to appointments and her husband really has to to a lot of work to get her here.  No ulceration.  No signs of infection of the lower extremity.  She has been on aspirin  therapy with no signs of recurrent DVT.  Current Outpatient Medications  Medication Sig Dispense Refill   meloxicam (MOBIC) 15 MG tablet Take 15 mg by mouth daily.     alendronate  (FOSAMAX ) 70 MG tablet TAKE 1 TABLET BY MOUTH ONCE A WEEK ON AN EMPTY STOMACH WITH A FULL GLASS OF WATER. 12 tablet 0   Alpha-Lipoic Acid 200 MG TABS Take by mouth.     apixaban  (ELIQUIS ) 5 MG TABS tablet Take 1 tablet (5 mg total) by mouth 2 (two) times daily. (Patient not taking: Reported on 01/26/2024) 60 tablet 6   aspirin  EC 81 MG tablet Take 81 mg by mouth daily. Swallow whole.     cholecalciferol (VITAMIN D3) 10 MCG (400 UNIT) TABS tablet Take 400 Units by mouth.     dronabinol (MARINOL) 2.5 MG capsule Take 2.5 mg by mouth daily as needed. (Patient not taking: Reported on 01/26/2024)     gabapentin  (NEURONTIN ) 300 MG capsule Take 1 capsule by mouth 4 (four) times daily. [name]     levothyroxine  (SYNTHROID ) 112 MCG tablet Take 1 tablet by mouth once daily 90 tablet 0   methocarbamol  (ROBAXIN ) 500 MG tablet SMARTSIG:0.5 Tablet(s) By Mouth Every 12 Hours PRN     montelukast  (SINGULAIR ) 10 MG tablet TAKE 1 TABLET BY MOUTH AT BEDTIME 90 tablet 0   Multiple Vitamins-Minerals (PRESERVISION AREDS 2 PO) Take 2 capsules by mouth daily.     Omega-3 Fatty  Acids (FISH OIL) 1000 MG CAPS Take by mouth.     oxyCODONE  (OXY IR/ROXICODONE ) 5 MG immediate release tablet Take 5 mg by mouth 2 (two) times daily.     oxyCODONE -acetaminophen  (PERCOCET/ROXICET) 5-325 MG tablet      pantoprazole  (PROTONIX ) 40 MG tablet Take 1 tablet (40 mg total) by mouth daily.     potassium chloride  (KLOR-CON ) 10 MEQ tablet TAKE 1 TABLET BY MOUTH THREE TIMES A WEEK 36 tablet 0   rOPINIRole  (REQUIP ) 1 MG tablet Take 2 tablets by mouth at bedtime.     sulfaSALAzine  (AZULFIDINE ) 500 MG EC tablet Take 500 mg by mouth 2 (two) times daily.     traZODone  (DESYREL ) 100 MG tablet Take 1 tablet (100 mg total) by mouth daily.     vitamin B-12 (CYANOCOBALAMIN ) 500 MCG tablet Take 500 mcg by mouth every 14 (fourteen) days.      No current facility-administered medications for this visit.    Past Medical History:  Diagnosis Date   Arthritis    Crohn disease (HCC)    Depression    GERD (gastroesophageal reflux disease)    Headache    Migraines   Heart murmur    Hyperlipidemia    Hypothyroidism  Macular degeneration    MS (multiple sclerosis) (HCC)    Restless leg    Seizures (HCC)    Tremors of nervous system     Past Surgical History:  Procedure Laterality Date   APPENDECTOMY Left 04/14/2018   Procedure: APPENDECTOMY;  Surgeon: Desiderio Schanz, MD;  Location: [facility];  Service: General;  Laterality: Left;   CATARACT EXTRACTION W/PHACO Right 10/30/2014   Procedure: CATARACT EXTRACTION PHACO AND INTRAOCULAR LENS PLACEMENT (IOC);  Surgeon: Steven Dingeldein, MD;  Location: [facility];  Service: Ophthalmology;  Laterality: Right;  US   1:55.7    CATARACT EXTRACTION W/PHACO Left 06/18/2015   Procedure: CATARACT EXTRACTION PHACO AND INTRAOCULAR LENS PLACEMENT (IOC);  Surgeon: Steven Dingeldein, MD;  Location: [facility];  Service: Ophthalmology;  Laterality: Left;  US  01:36 AP% 26.1 CDE 42.19 fluid pack lo0t # [id number] H   COLONOSCOPY  2014   normal- Rein- repeat in 5 years    COLONOSCOPY WITH PROPOFOL  N/A 07/09/2020   Procedure: COLONOSCOPY WITH PROPOFOL ;  Surgeon: Toledo, Ladell POUR, MD;  Location: [facility];  Service: Gastroenterology;  Laterality: N/A;   EYE SURGERY     FOOT SURGERY     INGUINAL HERNIA REPAIR Right 04/14/2018   Procedure: HERNIA REPAIR INGUINAL;  Surgeon: Desiderio Schanz, MD;  Location: [facility];  Service: General;  Laterality: Right;   NASAL SINUS SURGERY     PERIPHERAL VASCULAR THROMBECTOMY Right 01/10/2022   Procedure: PERIPHERAL VASCULAR THROMBECTOMY;  Surgeon: Marea Selinda RAMAN, MD;  Location: [facility];  Service: Cardiovascular;  Laterality: Right;   TONSILLECTOMY     TUBAL LIGATION     VAGINAL HYSTERECTOMY       Social History   Tobacco Use   Smoking status: Every Day    Current packs/day: 0.75    Average packs/day: 0.8 packs/day for 55.0 years (41.3 ttl pk-yrs)    Types: Cigarettes   Smokeless tobacco: Never  Vaping Use   Vaping status: Never Used  Substance Use Topics   Alcohol use: No    Alcohol/week: 0.0 standard drinks of alcohol   Drug use: No       Family History  Problem Relation Age of Onset   Heart disease Mother    Stroke Mother    Cancer Mother        colon   Mitral valve prolapse Mother    Stroke Father    Breast cancer Neg Hx      Allergies  Allergen Reactions   Codeine Nausea And Vomiting     REVIEW OF SYSTEMS (Negative unless checked)   Constitutional: [] Weight loss  [] Fever  [] Chills Cardiac: [] Chest pain   [] Chest pressure   [] Palpitations   [] Shortness of breath when laying flat   [] Shortness of breath at rest   [] Shortness of breath with exertion. Vascular:  [] Pain in legs with walking   [] Pain in legs at rest   [] Pain in legs when laying flat   [] Claudication   [] Pain in feet when walking  [] Pain in feet at rest  [] Pain in feet when laying flat   [x] History of DVT   [x] Phlebitis   [x] Swelling in legs   [] Varicose veins   [] Non-healing ulcers Pulmonary:   [] Uses home oxygen     [] Productive cough   [] Hemoptysis   [] Wheeze  [] COPD   [] Asthma Neurologic:  [] Dizziness  [] Blackouts   [x] Seizures   [] History of stroke   [] History of TIA  [] Aphasia   [] Temporary blindness   [] Dysphagia   [x] Weakness or numbness  in arms   [x] Weakness or numbness in legs Musculoskeletal:  [x] Arthritis   [] Joint swelling   [] Joint pain   [] Low back pain Hematologic:  [] Easy bruising  [] Easy bleeding   [] Hypercoagulable state   [] Anemic   Gastrointestinal:  [] Blood in stool   [] Vomiting blood  [x] Gastroesophageal reflux/heartburn   [] Abdominal pain Genitourinary:  [] Chronic kidney disease   [] Difficult urination  [] Frequent urination  [] Burning with urination   [] Hematuria Skin:  [] Rashes   [] Ulcers   [] Wounds Psychological:  [] History of anxiety   [x]  History of major depression.   Physical Examination  BP 138/81   Pulse 75   LMP  (LMP Unknown) Comment: had hysterectomy Gen:  WD/WN, NAD Head: [place]/AT, No temporalis wasting. Ear/Nose/Throat: Hearing grossly intact, nares w/o erythema or drainage Eyes: Conjunctiva clear. Sclera non-icteric Neck: Supple.  Trachea midline Pulmonary:  Good air movement, no use of accessory muscles.  Cardiac: RRR, no JVD Vascular:  Vessel Right Left  Radial Palpable Palpable                          PT Palpable Palpable  DP Palpable Palpable   Gastrointestinal: soft, non-tender/non-distended. No guarding/reflex.  Musculoskeletal: M/S 5/5 throughout.  No deformity or atrophy.  No significant lower extremity edema. Neurologic: Sensation grossly intact in extremities.  Symmetrical.  Speech is fluent.  Psychiatric: Judgment intact, Mood & affect appropriate for pt's clinical situation. Dermatologic: No rashes or ulcers noted.  No cellulitis or open wounds.      Labs No results found for this or any previous visit (from the past 2160 hours).  Radiology No results found.  Assessment/Plan  DVT (deep venous thrombosis) (HCC) Had extensive right  lower extremity thrombectomy about 2 years ago and has done well from this.  No current pain or swelling in her legs.  I can see her annually but it has become much more difficult for her to make doctors appointments so she and her husband will contact our office if they have worsening symptoms.  MS (multiple sclerosis) (HCC) Limits her mobility significantly which may worsen leg swelling.   Mixed hyperlipidemia lipid control important in reducing the progression of atherosclerotic disease  Selinda Gu, MD  01/26/2024 10:25 AM    This note was created with Dragon medical transcription system.  Any errors from dictation are purely unintentional

## 2024-01-26 NOTE — Assessment & Plan Note (Signed)
 Had extensive right lower extremity thrombectomy about 2 years ago and has done well from this.  No current pain or swelling in her legs.  I can see her annually but it has become much more difficult for her to make doctors appointments so she and her husband will contact our office if they have worsening symptoms.

## 2024-01-27 DIAGNOSIS — K219 Gastro-esophageal reflux disease without esophagitis: Secondary | ICD-10-CM | POA: Diagnosis not present

## 2024-01-27 DIAGNOSIS — K52831 Collagenous colitis: Secondary | ICD-10-CM | POA: Diagnosis not present

## 2024-02-29 DIAGNOSIS — F1120 Opioid dependence, uncomplicated: Secondary | ICD-10-CM | POA: Diagnosis not present

## 2024-02-29 DIAGNOSIS — Z79891 Long term (current) use of opiate analgesic: Secondary | ICD-10-CM | POA: Diagnosis not present

## 2024-03-22 DIAGNOSIS — H353221 Exudative age-related macular degeneration, left eye, with active choroidal neovascularization: Secondary | ICD-10-CM | POA: Diagnosis not present

## 2024-03-22 DIAGNOSIS — H353211 Exudative age-related macular degeneration, right eye, with active choroidal neovascularization: Secondary | ICD-10-CM | POA: Diagnosis not present

## 2024-03-29 ENCOUNTER — Encounter: Admitting: Student

## 2024-05-27 ENCOUNTER — Ambulatory Visit (INDEPENDENT_AMBULATORY_CARE_PROVIDER_SITE_OTHER): Payer: Self-pay | Admitting: Student

## 2024-05-27 ENCOUNTER — Encounter: Payer: Self-pay | Admitting: Student

## 2024-05-27 VITALS — BP 116/74 | HR 74 | Ht 62.0 in

## 2024-05-27 DIAGNOSIS — K52831 Collagenous colitis: Secondary | ICD-10-CM

## 2024-05-27 DIAGNOSIS — Z86718 Personal history of other venous thrombosis and embolism: Secondary | ICD-10-CM

## 2024-05-27 DIAGNOSIS — E782 Mixed hyperlipidemia: Secondary | ICD-10-CM

## 2024-05-27 DIAGNOSIS — I82421 Acute embolism and thrombosis of right iliac vein: Secondary | ICD-10-CM

## 2024-05-27 DIAGNOSIS — L859 Epidermal thickening, unspecified: Secondary | ICD-10-CM

## 2024-05-27 DIAGNOSIS — E559 Vitamin D deficiency, unspecified: Secondary | ICD-10-CM

## 2024-05-27 DIAGNOSIS — G2581 Restless legs syndrome: Secondary | ICD-10-CM

## 2024-05-27 DIAGNOSIS — M8000XD Age-related osteoporosis with current pathological fracture, unspecified site, subsequent encounter for fracture with routine healing: Secondary | ICD-10-CM | POA: Diagnosis not present

## 2024-05-27 DIAGNOSIS — E039 Hypothyroidism, unspecified: Secondary | ICD-10-CM | POA: Diagnosis not present

## 2024-05-27 DIAGNOSIS — E876 Hypokalemia: Secondary | ICD-10-CM

## 2024-05-27 DIAGNOSIS — Z720 Tobacco use: Secondary | ICD-10-CM | POA: Diagnosis not present

## 2024-05-27 DIAGNOSIS — G35D Multiple sclerosis, unspecified: Secondary | ICD-10-CM

## 2024-05-27 NOTE — Assessment & Plan Note (Signed)
 Smokes about 1ppd.  She is pre contemplative.

## 2024-05-27 NOTE — Progress Notes (Addendum)
 "  New Patient Office Visit  Subjective    Patient ID: Brooke Juarez, female    DOB: 1950/10/17  Age: 74 y.o. MRN: [id number]  CC:  Chief Complaint  Patient presents with   Establish Care    HPI Brooke Juarez  is a 74 year old person living with multiple chronic conditions  who presents to establish care. Patient interviewed with husband present. Please refer to problem based charting for further details and assessment and plan of current problem and chronic medical conditions.  Outpatient Encounter Medications as of 05/27/2024  Medication Sig   Alpha-Lipoic Acid 200 MG TABS Take by mouth.   aspirin  EC 81 MG tablet Take 81 mg by mouth daily. Swallow whole.   cholecalciferol (VITAMIN D3) 10 MCG (400 UNIT) TABS tablet Take 400 Units by mouth. (Patient taking differently: Take 2,000 Units by mouth.)   gabapentin  (NEURONTIN ) 300 MG capsule Take 1 capsule by mouth 4 (four) times daily. Brooke Juarez   levothyroxine  (SYNTHROID ) 112 MCG tablet Take 1 tablet by mouth once daily   meloxicam (MOBIC) 15 MG tablet Take 15 mg by mouth daily.   methocarbamol  (ROBAXIN ) 500 MG tablet SMARTSIG:0.5 Tablet(s) By Mouth Every 12 Hours PRN   Multiple Vitamins-Minerals (PRESERVISION AREDS 2 PO) Take 2 capsules by mouth daily.   Omega-3 Fatty Acids (FISH OIL) 1000 MG CAPS Take by mouth.   oxyCODONE  (OXY IR/ROXICODONE ) 5 MG immediate release tablet Take 5 mg by mouth 2 (two) times daily.   pantoprazole  (PROTONIX ) 40 MG tablet Take 1 tablet (40 mg total) by mouth daily.   rOPINIRole  (REQUIP ) 1 MG tablet Take 2 tablets by mouth at bedtime.   sulfaSALAzine  (AZULFIDINE ) 500 MG EC tablet Take 500 mg by mouth 2 (two) times daily.   traZODone  (DESYREL ) 100 MG tablet Take 1 tablet (100 mg total) by mouth daily.   vitamin B-12 (CYANOCOBALAMIN ) 500 MCG tablet Take 500 mcg by mouth every 14 (fourteen) days.    [DISCONTINUED] apixaban  (ELIQUIS ) 5 MG TABS tablet Take 1 tablet (5 mg total) by mouth 2 (two) times daily.    [DISCONTINUED] montelukast  (SINGULAIR ) 10 MG tablet TAKE 1 TABLET BY MOUTH AT BEDTIME   [DISCONTINUED] potassium chloride  (KLOR-CON ) 10 MEQ tablet TAKE 1 TABLET BY MOUTH THREE TIMES A WEEK   alendronate  (FOSAMAX ) 70 MG tablet TAKE 1 TABLET BY MOUTH ONCE A WEEK ON AN EMPTY STOMACH WITH A FULL GLASS OF WATER. (Patient not taking: Reported on 05/27/2024)   Cholecalciferol (D 1000) 25 MCG (1000 UT) capsule Take 1,000 Units by mouth daily.   oxyCODONE -acetaminophen  (PERCOCET/ROXICET) 5-325 MG tablet  (Patient not taking: Reported on 05/27/2024)   [DISCONTINUED] dronabinol (MARINOL) 2.5 MG capsule Take 2.5 mg by mouth daily as needed. (Patient not taking: Reported on 05/27/2024)   No facility-administered encounter medications on file as of 05/27/2024.    Past Medical History:  Diagnosis Date   Arthritis    Crohn disease (HCC)    Depression    GERD (gastroesophageal reflux disease)    Headache    Migraines   Heart murmur    Hyperlipidemia    Hypothyroidism    Macular degeneration    MS (multiple sclerosis)    Restless leg    Seizures (HCC)    Tremors of nervous system     Past Surgical History:  Procedure Laterality Date   APPENDECTOMY Left 04/14/2018   Procedure: APPENDECTOMY;  Surgeon: Brooke Schanz, MD;  Location: [facility];  Service: General;  Laterality: Left;   CATARACT  EXTRACTION W/PHACO Right 10/30/2014   Procedure: CATARACT EXTRACTION PHACO AND INTRAOCULAR LENS PLACEMENT (IOC);  Surgeon: Brooke Dingeldein, MD;  Location: [facility];  Service: Ophthalmology;  Laterality: Right;  US   1:55.7    CATARACT EXTRACTION W/PHACO Left 06/18/2015   Procedure: CATARACT EXTRACTION PHACO AND INTRAOCULAR LENS PLACEMENT (IOC);  Surgeon: Brooke Dingeldein, MD;  Location: [facility];  Service: Ophthalmology;  Laterality: Left;  US  01:36 AP% 26.1 CDE 42.19 fluid pack lo0t # [id number] H   COLONOSCOPY  2014   normal- Rein- repeat in 5 years   COLONOSCOPY WITH PROPOFOL  N/A 07/09/2020   Procedure: COLONOSCOPY  WITH PROPOFOL ;  Surgeon: Toledo, Ladell POUR, MD;  Location: [facility];  Service: Gastroenterology;  Laterality: N/A;   EYE SURGERY     FOOT SURGERY     INGUINAL HERNIA REPAIR Right 04/14/2018   Procedure: HERNIA REPAIR INGUINAL;  Surgeon: Brooke Schanz, MD;  Location: [facility];  Service: General;  Laterality: Right;   NASAL SINUS SURGERY     PERIPHERAL VASCULAR THROMBECTOMY Right 01/10/2022   Procedure: PERIPHERAL VASCULAR THROMBECTOMY;  Surgeon: Brooke Selinda RAMAN, MD;  Location: [facility];  Service: Cardiovascular;  Laterality: Right;   TONSILLECTOMY     TUBAL LIGATION     VAGINAL HYSTERECTOMY      Family History  Problem Relation Age of Onset   Heart disease Mother    Stroke Mother    Cancer Mother        colon   Mitral valve prolapse Mother    Stroke Father    Breast cancer Neg Hx     Social History   Socioeconomic History   Marital status: Married    Spouse name: Not on file   Number of children: 3   Years of education: some college   Highest education level: 12th grade  Occupational History   Occupation: Retired  Tobacco Use   Smoking status: Every Day    Current packs/day: 0.75    Average packs/day: 0.8 packs/day for 55.0 years (41.3 ttl pk-yrs)    Types: Cigarettes   Smokeless tobacco: Never  Vaping Use   Vaping status: Never Used  Substance and Sexual Activity   Alcohol use: No    Alcohol/week: 0.0 standard drinks of alcohol   Drug use: No   Sexual activity: Not on file  Other Topics Concern   Not on file  Social History Narrative   Not on file   Social Drivers of Health   Tobacco Use: High Risk (05/27/2024)   Patient History    Smoking Tobacco Use: Every Day    Smokeless Tobacco Use: Never    Passive Exposure: Not on file  Financial Resource Strain: Low Risk  (11/06/2023)   Received from [facility]   Overall Financial Resource Strain (CARDIA)    Difficulty of Paying Living Expenses: Not very hard  Food Insecurity: No  Food Insecurity (11/06/2023)   Received from [facility]   Epic    Within the past 12 months, you worried that your food would run out before you got the money to buy more.: Never true    Within the past 12 months, the food you bought just didn't last and you didn't have money to get more.: Never true  Transportation Needs: No Transportation Needs (11/06/2023)   Received from [facility] - Transportation    In the past 12 months, has lack of transportation kept you from medical appointments or from getting  medications?: No    Lack of Transportation (Non-Medical): No  Physical Activity: Inactive (11/19/2022)   Exercise Vital Sign    Days of Exercise per Week: 0 days    Minutes of Exercise per Session: 0 min  Stress: No Stress Concern Present (11/19/2022)   [employer] - Occupational Stress Questionnaire    Feeling of Stress : Not at all  Social Connections: Moderately Integrated (11/19/2022)   Social Connection and Isolation Panel    Frequency of Communication with Friends and Family: Three times a week    Frequency of Social Gatherings with Friends and Family: Twice a week    Attends Religious Services: Never    Database Administrator or Organizations: Yes    Attends Banker Meetings: Never    Marital Status: Married  Catering Manager Violence: Not At Risk (05/27/2024)   Epic    Fear of Current or Ex-Partner: No    Emotionally Abused: No    Physically Abused: No    Sexually Abused: No  Depression (PHQ2-9): Low Risk (12/23/2022)   Depression (PHQ2-9)    PHQ-2 Score: 0  Alcohol Screen: Low Risk (11/19/2022)   Alcohol Screen    Last Alcohol Screening Score (AUDIT): 0  Housing: Low Risk  (11/06/2023)   Received from [facility]   Epic    At any time in the past 12 months, were you homeless or living in a shelter (including now)?: No    In the past 12 months, how many times have you moved  where you were living?: 0    In the last 12 months, was there a time when you were not able to pay the mortgage or rent on time?: No  Utilities: Not At Risk (11/06/2023)   Received from [facility]   Epic    In the past 12 months has the electric, gas, oil, or water company threatened to shut off services in your home?: No  Health Literacy: Not on file    ROS Refer to HPI    Objective   BP 116/74   Pulse 74   Ht 5' 2 (1.575 m)   LMP  (LMP Unknown) Comment: had hysterectomy  SpO2 94%   BMI 27.07 kg/m   Physical Exam Constitutional:      Appearance: Normal appearance.     Comments: Sitting in a wheechair  HENT:     Mouth/Throat:     Mouth: Mucous membranes are moist.     Pharynx: Oropharynx is clear.  Cardiovascular:     Rate and Rhythm: Normal rate and regular rhythm.  Pulmonary:     Effort: Pulmonary effort is normal.     Breath sounds: No rhonchi or rales.  Abdominal:     General: Abdomen is flat. Bowel sounds are normal. There is no distension.     Palpations: Abdomen is soft.     Tenderness: There is no abdominal tenderness.  Musculoskeletal:        General: Normal range of motion.     Right lower leg: No edema.     Left lower leg: No edema.  Skin:    General: Skin is warm and dry.     Capillary Refill: Capillary refill takes less than 2 seconds.     Comments: Hyperkeratotic nodule of the right cheek with erythematous base   Neurological:     General: No focal deficit present.     Mental Status: She is alert and oriented  to person, place, and time.  Psychiatric:        Mood and Affect: Mood normal.        Behavior: Behavior normal.        12/23/2022    2:00 PM 12/05/2022   10:54 AM 11/19/2022    9:10 AM  Depression screen PHQ 2/9  Decreased Interest 0 0 0  Down, Depressed, Hopeless 0 0 1  PHQ - 2 Score 0 0 1  Altered sleeping 0 0 0  Tired, decreased energy 0 0 0  Change in appetite 0 0 0  Feeling bad or failure about yourself  0 0 0   Trouble concentrating 0 0 0  Moving slowly or fidgety/restless 0 0 0  Suicidal thoughts 0 0 0  PHQ-9 Score 0  0  1   Difficult doing work/chores Not difficult at all Not difficult at all Not difficult at all     Data saved with a previous flowsheet row definition      12/23/2022    2:00 PM 12/05/2022   10:54 AM 06/04/2022    9:52 AM 12/02/2021    8:54 AM  GAD 7 : Generalized Anxiety Score  Nervous, Anxious, on Edge 0 0 0 0  Control/stop worrying 0 0 0 0  Worry too much - different things 0 0 0 0  Trouble relaxing 0 0 0 0  Restless 0 0 0 0  Easily annoyed or irritable 0 0 0 0  Afraid - awful might happen 0 0 0 0  Total GAD 7 Score 0 0 0 0  Anxiety Difficulty Not difficult at all Not difficult at all Not difficult at all Not difficult at all    Last CBC Lab Results  Component Value Date   WBC 7.0 01/11/2022   HGB 9.7 (L) 01/11/2022   HCT 30.8 (L) 01/11/2022   MCV 90.9 01/11/2022   MCH 28.6 01/11/2022   RDW 13.4 01/11/2022   PLT 170 01/11/2022   Last metabolic panel Lab Results  Component Value Date   GLUCOSE 97 05/27/2024   NA 143 05/27/2024   K 4.1 05/27/2024   CL 104 05/27/2024   CO2 24 05/27/2024   BUN 12 05/27/2024   CREATININE 0.71 05/27/2024   EGFR 89 05/27/2024   CALCIUM  9.7 05/27/2024   PHOS 3.7 12/16/2022   PROT 6.3 05/27/2024   ALBUMIN 4.2 05/27/2024   LABGLOB 2.1 05/27/2024   AGRATIO 2.3 (H) 06/04/2022   BILITOT 0.4 05/27/2024   ALKPHOS 190 (H) 05/27/2024   AST 17 05/27/2024   ALT 15 05/27/2024   ANIONGAP 7 01/11/2022   Last lipids Lab Results  Component Value Date   CHOL 164 05/27/2024   HDL 30 (L) 05/27/2024   LDLCALC 116 (H) 05/27/2024   TRIG 97 05/27/2024   CHOLHDL 5.5 (H) 05/27/2024   Last thyroid  functions Lab Results  Component Value Date   TSH 1.130 12/16/2022   T4TOTAL 9.8 06/04/2022        Assessment & Plan:  Epidermal thickening, unspecified Assessment & Plan: Cutaneous horn that has been expanding for the last 2 weeks,  no pain or bleeding. Mildly erythematous base. Referral made to dermatology   Orders: -     Ambulatory referral to Dermatology  MS (multiple sclerosis) Assessment & Plan: Seeing Neurology and pain management. Symptomatic management with oxyodone, meloxicam,  and methocarbamol .    Tobacco abuse Assessment & Plan: Smokes about 1ppd.  She is pre contemplative.   History of DVT of lower  extremity Assessment & Plan: Hx of thrombectomy in 2023. No significant leg swelling today. Currently on aspirin , managed by vascular surgery.    Hypothyroidism, unspecified type Assessment & Plan: Currently supplemented with levothyroxine  112 mcg daily. Normal T4 in May 2025. Repeat at next visit.    Mixed hyperlipidemia Assessment & Plan: Elevated cholesterol in May, elevated risk. Not currently on statin. Lipid panel today.   Orders: -     Lipid panel  Hypokalemia Assessment & Plan: Is on potassium 10 mEQ three times a week. Recommended increasing dietary potassium, labs today.   Orders: -     Comprehensive metabolic panel with GFR  Collagenous colitis Assessment & Plan: Seeing [facility] currently on sulfasalizine and dietary modification, due  for colonoscopy 2027   Orders: -     VITAMIN D  25 Hydroxy (Vit-D Deficiency, Fractures)  Age-related osteoporosis with current pathological fracture with routine healing, subsequent encounter Assessment & Plan: Taking vitamin d  but no calcium . Off alendronate  for about 6 months. Prone to falling due to poor mobility from MS. Labs ordered. Will discuss risks and benefits of medication at next visit.    Vitamin D  deficiency Assessment & Plan: Check vitamin D  today.    Restless leg Assessment & Plan: Currently on ropinirole  managed by neurology.      Return in about 4 months (around 09/24/2024) for osteoporosis .   Harlene Saddler, MD "

## 2024-05-27 NOTE — Assessment & Plan Note (Signed)
 Seeing Neurology and pain management. Symptomatic management with oxyodone, meloxicam,  and methocarbamol .

## 2024-05-27 NOTE — Assessment & Plan Note (Addendum)
 Hx of thrombectomy in 2023. No significant leg swelling today. Currently on aspirin , managed by vascular surgery.

## 2024-05-27 NOTE — Assessment & Plan Note (Signed)
 Cutaneous horn that has been expanding for the last 2 weeks, no pain or bleeding. Mildly erythematous base. Referral made to dermatology

## 2024-05-27 NOTE — Assessment & Plan Note (Addendum)
 Seeing [facility] currently on sulfasalizine and dietary modification, due  for colonoscopy 2027

## 2024-05-27 NOTE — Assessment & Plan Note (Addendum)
 Is on potassium 10 mEQ three times a week. Recommended increasing dietary potassium, labs today.

## 2024-05-27 NOTE — Assessment & Plan Note (Signed)
 Currently supplemented with levothyroxine  112 mcg daily. Normal T4 in May 2025. Repeat at next visit.

## 2024-05-27 NOTE — Assessment & Plan Note (Addendum)
 Taking vitamin d  but no calcium . Off alendronate  for about 6 months. Prone to falling due to poor mobility from MS. Labs ordered. Will discuss risks and benefits of medication at next visit.

## 2024-05-27 NOTE — Assessment & Plan Note (Addendum)
 Elevated cholesterol in May, elevated risk. Not currently on statin. Lipid panel today.

## 2024-05-28 LAB — LIPID PANEL
Chol/HDL Ratio: 5.5 ratio — ABNORMAL HIGH (ref 0.0–4.4)
Cholesterol, Total: 164 mg/dL (ref 100–199)
HDL: 30 mg/dL — ABNORMAL LOW
LDL Chol Calc (NIH): 116 mg/dL — ABNORMAL HIGH (ref 0–99)
Triglycerides: 97 mg/dL (ref 0–149)
VLDL Cholesterol Cal: 18 mg/dL (ref 5–40)

## 2024-05-28 LAB — COMPREHENSIVE METABOLIC PANEL WITH GFR
ALT: 15 IU/L (ref 0–32)
AST: 17 IU/L (ref 0–40)
Albumin: 4.2 g/dL (ref 3.8–4.8)
Alkaline Phosphatase: 190 IU/L — ABNORMAL HIGH (ref 49–135)
BUN/Creatinine Ratio: 17 (ref 12–28)
BUN: 12 mg/dL (ref 8–27)
Bilirubin Total: 0.4 mg/dL (ref 0.0–1.2)
CO2: 24 mmol/L (ref 20–29)
Calcium: 9.7 mg/dL (ref 8.7–10.3)
Chloride: 104 mmol/L (ref 96–106)
Creatinine, Ser: 0.71 mg/dL (ref 0.57–1.00)
Globulin, Total: 2.1 g/dL (ref 1.5–4.5)
Glucose: 97 mg/dL (ref 70–99)
Potassium: 4.1 mmol/L (ref 3.5–5.2)
Sodium: 143 mmol/L (ref 134–144)
Total Protein: 6.3 g/dL (ref 6.0–8.5)
eGFR: 89 mL/min/1.73

## 2024-05-28 LAB — VITAMIN D 25 HYDROXY (VIT D DEFICIENCY, FRACTURES): Vit D, 25-Hydroxy: 28.9 ng/mL — ABNORMAL LOW (ref 30.0–100.0)

## 2024-05-30 ENCOUNTER — Ambulatory Visit: Payer: Self-pay | Admitting: Student

## 2024-05-30 MED ORDER — ROSUVASTATIN CALCIUM 20 MG PO TABS: 20.0000 mg | ORAL_TABLET | Freq: Every day | ORAL | 1 refills | Status: AC

## 2024-06-06 NOTE — Assessment & Plan Note (Signed)
Check vitamin D today. 

## 2024-06-06 NOTE — Assessment & Plan Note (Signed)
 Currently on ropinirole  managed by neurology.

## 2024-06-06 NOTE — Assessment & Plan Note (Deleted)
 Currently on levothyroxine  112 mcg daily. TSH 8.897, T4 0.96 on 09/2023, will repeat at next visit.

## 2024-06-07 NOTE — Addendum Note (Signed)
 Addended by: LEMON RAISIN on: 06/07/2024 08:29 AM   Modules accepted: Level of Service

## 2024-06-10 ENCOUNTER — Other Ambulatory Visit: Payer: Self-pay | Admitting: Student

## 2024-06-10 DIAGNOSIS — E039 Hypothyroidism, unspecified: Secondary | ICD-10-CM

## 2024-06-10 NOTE — Telephone Encounter (Signed)
 Requested medication (s) are due for refill today - yes  Requested medication (s) are on the active medication list -yes  Future visit scheduled -yes  Last refill: 07/14/23 #90  Notes to clinic: Fails lab protocol- over 1 year-12/16/22  Requested Prescriptions  Pending Prescriptions Disp Refills   levothyroxine  (SYNTHROID ) 112 MCG tablet 90 tablet 0    Sig: Take 1 tablet (112 mcg total) by mouth daily.     Endocrinology:  Hypothyroid Agents Failed - 06/10/2024  1:44 PM      Failed - TSH in normal range and within 360 days    TSH  Date Value Ref Range Status  12/16/2022 1.130 0.450 - 4.500 uIU/mL Final         Passed - Valid encounter within last 12 months    Recent Outpatient Visits           2 weeks ago Epidermal thickening, unspecified   [facility] at [facility], [place], MD                 Requested Prescriptions  Pending Prescriptions Disp Refills   levothyroxine  (SYNTHROID ) 112 MCG tablet 90 tablet 0    Sig: Take 1 tablet (112 mcg total) by mouth daily.     Endocrinology:  Hypothyroid Agents Failed - 06/10/2024  1:44 PM      Failed - TSH in normal range and within 360 days    TSH  Date Value Ref Range Status  12/16/2022 1.130 0.450 - 4.500 uIU/mL Final         Passed - Valid encounter within last 12 months    Recent Outpatient Visits           2 weeks ago Epidermal thickening, unspecified   [facility] at [facility], MD

## 2024-06-10 NOTE — Telephone Encounter (Unsigned)
 Copied from CRM #[id number]. Topic: Clinical - Medication Refill >> Jun 10, 2024 11:00 AM Nathanel BROCKS wrote: Medication: levothyroxine  (SYNTHROID ) 112 MCG tablet  Has the patient contacted their pharmacy? No   This is the patient's preferred pharmacy:  [facility] [address]), [place] - [address] [place]) [place] [address] Phone: [phone] Fax: [phone]  Is this the correct pharmacy for this prescription? Yes If no, delete pharmacy and type the correct one.   Has the prescription been filled recently? Yes  Is the patient out of the medication? Yes  Has the patient been seen for an appointment in the last year OR does the patient have an upcoming appointment? Yes  Can we respond through MyChart? No  Agent: Please be advised that Rx refills may take up to 3 business days. We ask that you follow-up with your pharmacy.

## 2024-06-14 ENCOUNTER — Telehealth: Payer: Self-pay | Admitting: Student

## 2024-06-14 MED ORDER — LEVOTHYROXINE SODIUM 112 MCG PO TABS: 112.0000 ug | ORAL_TABLET | Freq: Every day | ORAL | 0 refills | Status: AC

## 2024-06-14 NOTE — Telephone Encounter (Signed)
 Refill sent. Mychart message sent.  KP

## 2024-06-14 NOTE — Telephone Encounter (Signed)
 Copied from CRM #[id number]. Topic: Clinical - Medication Question >> Jun 14, 2024  9:35 AM Gustabo D wrote: levothyroxine  (SYNTHROID ) 112 MCG tablet- pt says her pharmacy says they haven't received it yet. Pt would like a call back with a update please.

## 2024-09-26 ENCOUNTER — Ambulatory Visit: Admitting: Student
